# Patient Record
Sex: Female | Born: 1980 | Race: Black or African American | Hispanic: No | Marital: Single | Smoking: Current every day smoker
Health system: Southern US, Community
[De-identification: ages and names within clinical notes are randomized; demographics above are authoritative.]

## PROBLEM LIST (undated history)

## (undated) DIAGNOSIS — D649 Anemia, unspecified: Secondary | ICD-10-CM

## (undated) DIAGNOSIS — T7840XA Allergy, unspecified, initial encounter: Secondary | ICD-10-CM

## (undated) HISTORY — DX: Allergy, unspecified, initial encounter: T78.40XA

---
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
## 2011-07-16 HISTORY — PX: CHOLECYSTECTOMY: SHX55

## 2019-07-20 ENCOUNTER — Encounter: Payer: Self-pay | Admitting: Family Medicine

## 2019-07-20 ENCOUNTER — Ambulatory Visit (INDEPENDENT_AMBULATORY_CARE_PROVIDER_SITE_OTHER): Payer: BC Managed Care – PPO | Admitting: Family Medicine

## 2019-07-20 ENCOUNTER — Other Ambulatory Visit: Payer: Self-pay

## 2019-07-20 VITALS — BP 138/72 | HR 93 | Wt 296.0 lb

## 2019-07-20 DIAGNOSIS — N83209 Unspecified ovarian cyst, unspecified side: Secondary | ICD-10-CM | POA: Insufficient documentation

## 2019-07-20 DIAGNOSIS — D510 Vitamin B12 deficiency anemia due to intrinsic factor deficiency: Secondary | ICD-10-CM

## 2019-07-20 DIAGNOSIS — Z9884 Bariatric surgery status: Secondary | ICD-10-CM | POA: Insufficient documentation

## 2019-07-20 DIAGNOSIS — D519 Vitamin B12 deficiency anemia, unspecified: Secondary | ICD-10-CM | POA: Insufficient documentation

## 2019-07-20 DIAGNOSIS — M545 Low back pain: Secondary | ICD-10-CM

## 2019-07-20 DIAGNOSIS — Z8719 Personal history of other diseases of the digestive system: Secondary | ICD-10-CM | POA: Insufficient documentation

## 2019-07-20 DIAGNOSIS — F17200 Nicotine dependence, unspecified, uncomplicated: Secondary | ICD-10-CM | POA: Insufficient documentation

## 2019-07-20 DIAGNOSIS — G8929 Other chronic pain: Secondary | ICD-10-CM | POA: Insufficient documentation

## 2019-07-20 DIAGNOSIS — K219 Gastro-esophageal reflux disease without esophagitis: Secondary | ICD-10-CM | POA: Insufficient documentation

## 2019-07-20 NOTE — Assessment & Plan Note (Signed)
She reports that she would like to lose some weight but this has been difficult in the setting of Covid which is actually to weight gain.  Will discuss in more depth at subsequent visit.

## 2019-07-20 NOTE — Assessment & Plan Note (Signed)
She was encouraged to reduce her alcohol use as much as possible to avoid recurrence of her pancreatitis.

## 2019-07-20 NOTE — Assessment & Plan Note (Signed)
-  Release of information filled out for PCP to obtain records of MRI which shows evidence ovarian cyst -We will follow-up with characterization of cyst and consider further work-up options

## 2019-07-20 NOTE — Progress Notes (Signed)
Subjective:  Alexis Avila is a 39 y.o. female who presents to the [facility] today with a chief complaint of establishing care.   HPI: Alexis Avila recently moved from [place] to [place] with her boyfriend.  She lives at home with her boyfriend and feels safe in that environment.  She currently works at a call center.  Her pertinent medical history is noted below.  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  She reports having B12 injections routinely.  Her last B12 injection was roughly 1 year ago.  She is not sure when she would next be due for an injection but does feel like she might have a low energy level.  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  She continues to drink roughly 13 drinks per week of liquor, wine, beer.  Current smoker She reports that she smokes roughly half a pack per day and has been smoking for roughly the past 5 years.  Desires fertility She is currently sexually active with 1 female partner.  She is interested in becoming pregnant.  She is not currently using any form of birth control.  She has been active with this 1 partner for several years now and with no one else.  She has been tested for STIs since she became active with this partner.  Cancer screening Her last Pap smear was performed in 2019 and was entirely unremarkable per her report.  GERD This is been a recurrent issue since her gastric bypass.  She has previously been on medication and now uses over-the-counter Tums and Prilosec.  She reports that she does not take Tums every day and that it has been years since she was given a prescription for this issue.  Chief Complaint noted Review of Symptoms - see HPI PMH - Smoking status noted.    Objective:  Physical Exam: BP 138/72   Pulse 93   Wt 296 lb (134.3 kg)   SpO2 98%    Gen: 39 year old obese woman appearance consistent with stated age.  NAD, resting comfortably HEENT: Poor  dentition.  Multiple dental caries present in addition to 2 upper molars absent. CV: RRR with no murmurs appreciated Pulm: NWOB, CTAB with no crackles, wheezes, or rhonchi GI: Normal bowel sounds present. Soft, Nontender, Nondistended. MSK: no edema, cyanosis, or clubbing noted Skin: warm, dry Neuro: grossly normal, moves all extremities Psych: Normal affect and thought content  No results found for this or any previous visit (from the past 72 hour(s)).   Assessment/Plan:  Ovarian cyst -Release of information filled out for PCP to obtain records of MRI which shows evidence ovarian cyst -We will follow-up with characterization of cyst and consider further work-up options  GERD (gastroesophageal reflux disease) We will continue to monitor for now.  No medication prescribed at this time.  Morbid obesity ([place]) She reports that she would like to lose some weight but this has been difficult in the setting of Covid which is actually to weight gain.  Will discuss in more depth at subsequent visit.  History of pancreatitis She was encouraged to reduce her alcohol use as much as possible to avoid recurrence of her pancreatitis.  Current smoker She is encouraged to reduce or cease smoking as this is possible.  She reported that she would like to try and quit smoking later on in the year 2021.  She understands that if she becomes pregnant, smoking can lead to harm to the fetus.  Chronic back pain No regular pain management necessary at this time. -We will continue to monitor -Release of information filled out for MRI lumbar spine from her PCP in [place]  Anemia, B12 deficiency -Follow-up CBC, B12 level

## 2019-07-20 NOTE — Assessment & Plan Note (Signed)
She is encouraged to reduce or cease smoking as this is possible.  She reported that she would like to try and quit smoking later on in the year 2021.  She understands that if she becomes pregnant, smoking can lead to harm to the fetus.

## 2019-07-20 NOTE — Assessment & Plan Note (Signed)
-  Follow-up CBC, B12 level

## 2019-07-20 NOTE — Assessment & Plan Note (Addendum)
No regular pain management necessary at this time. -We will continue to monitor -Release of information filled out for MRI lumbar spine from her PCP in [place]

## 2019-07-20 NOTE — Assessment & Plan Note (Signed)
We will continue to monitor for now.  No medication prescribed at this time.

## 2019-07-21 NOTE — Addendum Note (Signed)
Addended by: Grant Ruts on: 07/21/2019 04:00 PM   Modules accepted: Orders

## 2019-07-22 LAB — VITAMIN B12: Vitamin B-12: 142 pg/mL — ABNORMAL LOW (ref 232–1245)

## 2019-07-23 ENCOUNTER — Encounter: Payer: Self-pay | Admitting: Family Medicine

## 2019-07-23 NOTE — Progress Notes (Signed)
2 attempts to reach pt by phone. VM not set up. Generic VM left.  Letter sent that pt would benefit form B12 injections.

## 2019-07-24 LAB — CBC
Hematocrit: 35.4 % (ref 34.0–46.6)
Hemoglobin: 10.6 g/dL — ABNORMAL LOW (ref 11.1–15.9)
MCH: 25.4 pg — ABNORMAL LOW (ref 26.6–33.0)
MCHC: 29.9 g/dL — ABNORMAL LOW (ref 31.5–35.7)
MCV: 85 fL (ref 79–97)
Platelets: 257 10*3/uL (ref 150–450)
RBC: 4.17 x10E6/uL (ref 3.77–5.28)
RDW: 19.2 % — ABNORMAL HIGH (ref 11.7–15.4)
WBC: 6.1 10*3/uL (ref 3.4–10.8)

## 2019-07-24 LAB — VITAMIN B1: Thiamine: 94.3 nmol/L (ref 66.5–200.0)

## 2019-07-26 ENCOUNTER — Telehealth: Payer: Self-pay | Admitting: Family Medicine

## 2019-07-26 NOTE — Telephone Encounter (Signed)
Called pt  No answer  Will try again later

## 2019-07-26 NOTE — Telephone Encounter (Signed)
VM not set up. LM to call clinic for lab results.

## 2019-07-26 NOTE — Telephone Encounter (Signed)
Will forward to Dr. Pilar Plate.  Lauralei Clouse,CMA

## 2019-07-26 NOTE — Telephone Encounter (Signed)
Pt returning [place] call. Please call pt back w/ results.

## 2019-07-29 ENCOUNTER — Other Ambulatory Visit: Payer: Self-pay | Admitting: Family Medicine

## 2019-07-29 DIAGNOSIS — D510 Vitamin B12 deficiency anemia due to intrinsic factor deficiency: Secondary | ICD-10-CM

## 2019-07-29 MED ORDER — CYANOCOBALAMIN 1000 MCG/ML IJ SOLN
1000.0000 ug | INTRAMUSCULAR | Status: AC
  Administered 2019-08-03: 1000 ug via INTRAMUSCULAR

## 2019-07-29 NOTE — Progress Notes (Signed)
[name] called and informed that her B 12 is low and she will need weekly injections for the next 4 weeks.   Initial RN visit scheduled.  B12 order placed.  Alexis Haymaker, MD

## 2019-07-30 ENCOUNTER — Telehealth: Payer: Self-pay | Admitting: Family Medicine

## 2019-07-30 NOTE — Telephone Encounter (Signed)
Patient says she needs a doctor's note for medical necessity to send to her insurance company. It needs to state that she needs to go to the gym due to obesity. Patient would like to pick this letter up when she comes to her appointment on 08-03-2019 if possible. Please call patient with any questions.

## 2019-07-30 NOTE — Telephone Encounter (Signed)
Routed note to PCP. Salvatore Marvel, CMA

## 2019-08-03 ENCOUNTER — Other Ambulatory Visit: Payer: Self-pay

## 2019-08-03 ENCOUNTER — Ambulatory Visit (INDEPENDENT_AMBULATORY_CARE_PROVIDER_SITE_OTHER): Payer: BC Managed Care – PPO

## 2019-08-03 DIAGNOSIS — D510 Vitamin B12 deficiency anemia due to intrinsic factor deficiency: Secondary | ICD-10-CM

## 2019-08-03 NOTE — Progress Notes (Signed)
Patient presents in nurse clinic for #1 b12 injection out of 4, per [place] orders. Injection given RD, site unremarkable. Patient to return same time next week, reminder card given.

## 2019-08-10 ENCOUNTER — Ambulatory Visit: Payer: BC Managed Care – PPO

## 2019-08-11 ENCOUNTER — Ambulatory Visit: Payer: BC Managed Care – PPO

## 2019-08-31 ENCOUNTER — Ambulatory Visit: Payer: BC Managed Care – PPO

## 2019-09-07 DIAGNOSIS — H0011 Chalazion right upper eyelid: Secondary | ICD-10-CM | POA: Diagnosis not present

## 2019-12-15 ENCOUNTER — Encounter: Payer: Self-pay | Admitting: Family Medicine

## 2020-02-06 ENCOUNTER — Other Ambulatory Visit: Payer: Self-pay

## 2020-02-06 ENCOUNTER — Emergency Department (HOSPITAL_COMMUNITY)
Admission: EM | Admit: 2020-02-06 | Discharge: 2020-02-06 | Discharge status: Against Medical Advice | Payer: BC Managed Care – PPO

## 2020-02-06 DIAGNOSIS — R112 Nausea with vomiting, unspecified: Secondary | ICD-10-CM | POA: Diagnosis not present

## 2020-02-06 DIAGNOSIS — N390 Urinary tract infection, site not specified: Secondary | ICD-10-CM | POA: Diagnosis not present

## 2020-02-09 ENCOUNTER — Other Ambulatory Visit: Payer: Self-pay

## 2020-02-09 ENCOUNTER — Encounter (HOSPITAL_COMMUNITY): Payer: Self-pay | Admitting: *Deleted

## 2020-02-09 ENCOUNTER — Emergency Department (HOSPITAL_COMMUNITY): Payer: BC Managed Care – PPO

## 2020-02-09 ENCOUNTER — Ambulatory Visit (INDEPENDENT_AMBULATORY_CARE_PROVIDER_SITE_OTHER): Payer: BC Managed Care – PPO | Admitting: Student in an Organized Health Care Education/Training Program

## 2020-02-09 ENCOUNTER — Inpatient Hospital Stay (HOSPITAL_COMMUNITY)
Admission: EM | Admit: 2020-02-09 | Discharge: 2020-02-23 | DRG: 326 | Disposition: A | Discharge status: Home Health | Payer: BC Managed Care – PPO | Source: Ambulatory Visit | Attending: Family Medicine | Admitting: Family Medicine

## 2020-02-09 DIAGNOSIS — E871 Hypo-osmolality and hyponatremia: Secondary | ICD-10-CM | POA: Diagnosis present

## 2020-02-09 DIAGNOSIS — K661 Hemoperitoneum: Secondary | ICD-10-CM | POA: Diagnosis not present

## 2020-02-09 DIAGNOSIS — J9811 Atelectasis: Secondary | ICD-10-CM | POA: Diagnosis present

## 2020-02-09 DIAGNOSIS — K5651 Intestinal adhesions [bands], with partial obstruction: Secondary | ICD-10-CM | POA: Diagnosis present

## 2020-02-09 DIAGNOSIS — Z825 Family history of asthma and other chronic lower respiratory diseases: Secondary | ICD-10-CM

## 2020-02-09 DIAGNOSIS — K9589 Other complications of other bariatric procedure: Principal | ICD-10-CM | POA: Diagnosis present

## 2020-02-09 DIAGNOSIS — R918 Other nonspecific abnormal finding of lung field: Secondary | ICD-10-CM | POA: Diagnosis not present

## 2020-02-09 DIAGNOSIS — Z87440 Personal history of urinary (tract) infections: Secondary | ICD-10-CM

## 2020-02-09 DIAGNOSIS — R1084 Generalized abdominal pain: Secondary | ICD-10-CM | POA: Diagnosis not present

## 2020-02-09 DIAGNOSIS — E878 Other disorders of electrolyte and fluid balance, not elsewhere classified: Secondary | ICD-10-CM | POA: Diagnosis present

## 2020-02-09 DIAGNOSIS — A419 Sepsis, unspecified organism: Secondary | ICD-10-CM | POA: Diagnosis not present

## 2020-02-09 DIAGNOSIS — E8770 Fluid overload, unspecified: Secondary | ICD-10-CM | POA: Diagnosis not present

## 2020-02-09 DIAGNOSIS — R7402 Elevation of levels of lactic acid dehydrogenase (LDH): Secondary | ICD-10-CM | POA: Diagnosis present

## 2020-02-09 DIAGNOSIS — Z20822 Contact with and (suspected) exposure to covid-19: Secondary | ICD-10-CM | POA: Diagnosis not present

## 2020-02-09 DIAGNOSIS — M7989 Other specified soft tissue disorders: Secondary | ICD-10-CM | POA: Diagnosis not present

## 2020-02-09 DIAGNOSIS — I878 Other specified disorders of veins: Secondary | ICD-10-CM | POA: Diagnosis not present

## 2020-02-09 DIAGNOSIS — K2100 Gastro-esophageal reflux disease with esophagitis, without bleeding: Secondary | ICD-10-CM | POA: Diagnosis not present

## 2020-02-09 DIAGNOSIS — F17210 Nicotine dependence, cigarettes, uncomplicated: Secondary | ICD-10-CM | POA: Diagnosis present

## 2020-02-09 DIAGNOSIS — K3189 Other diseases of stomach and duodenum: Secondary | ICD-10-CM | POA: Diagnosis not present

## 2020-02-09 DIAGNOSIS — R809 Proteinuria, unspecified: Secondary | ICD-10-CM | POA: Diagnosis present

## 2020-02-09 DIAGNOSIS — R1032 Left lower quadrant pain: Secondary | ICD-10-CM | POA: Diagnosis not present

## 2020-02-09 DIAGNOSIS — K863 Pseudocyst of pancreas: Secondary | ICD-10-CM | POA: Insufficient documentation

## 2020-02-09 DIAGNOSIS — Z9049 Acquired absence of other specified parts of digestive tract: Secondary | ICD-10-CM

## 2020-02-09 DIAGNOSIS — Z5331 Laparoscopic surgical procedure converted to open procedure: Secondary | ICD-10-CM

## 2020-02-09 DIAGNOSIS — Z880 Allergy status to penicillin: Secondary | ICD-10-CM

## 2020-02-09 DIAGNOSIS — Z8719 Personal history of other diseases of the digestive system: Secondary | ICD-10-CM

## 2020-02-09 DIAGNOSIS — K859 Acute pancreatitis without necrosis or infection, unspecified: Secondary | ICD-10-CM | POA: Diagnosis not present

## 2020-02-09 DIAGNOSIS — K56600 Partial intestinal obstruction, unspecified as to cause: Secondary | ICD-10-CM | POA: Diagnosis not present

## 2020-02-09 DIAGNOSIS — Z9884 Bariatric surgery status: Secondary | ICD-10-CM | POA: Diagnosis not present

## 2020-02-09 DIAGNOSIS — K651 Peritoneal abscess: Secondary | ICD-10-CM | POA: Diagnosis present

## 2020-02-09 DIAGNOSIS — K861 Other chronic pancreatitis: Secondary | ICD-10-CM | POA: Diagnosis present

## 2020-02-09 DIAGNOSIS — Z9889 Other specified postprocedural states: Secondary | ICD-10-CM | POA: Diagnosis not present

## 2020-02-09 DIAGNOSIS — K314 Gastric diverticulum: Secondary | ICD-10-CM | POA: Diagnosis present

## 2020-02-09 DIAGNOSIS — R0600 Dyspnea, unspecified: Secondary | ICD-10-CM | POA: Diagnosis not present

## 2020-02-09 DIAGNOSIS — R109 Unspecified abdominal pain: Secondary | ICD-10-CM | POA: Diagnosis not present

## 2020-02-09 DIAGNOSIS — Z8249 Family history of ischemic heart disease and other diseases of the circulatory system: Secondary | ICD-10-CM

## 2020-02-09 DIAGNOSIS — R748 Abnormal levels of other serum enzymes: Secondary | ICD-10-CM | POA: Diagnosis present

## 2020-02-09 DIAGNOSIS — R933 Abnormal findings on diagnostic imaging of other parts of digestive tract: Secondary | ICD-10-CM | POA: Diagnosis not present

## 2020-02-09 DIAGNOSIS — Z980 Intestinal bypass and anastomosis status: Secondary | ICD-10-CM | POA: Diagnosis not present

## 2020-02-09 DIAGNOSIS — Z6841 Body Mass Index (BMI) 40.0 and over, adult: Secondary | ICD-10-CM

## 2020-02-09 DIAGNOSIS — E876 Hypokalemia: Secondary | ICD-10-CM | POA: Diagnosis present

## 2020-02-09 DIAGNOSIS — K631 Perforation of intestine (nontraumatic): Secondary | ICD-10-CM | POA: Diagnosis not present

## 2020-02-09 DIAGNOSIS — R0602 Shortness of breath: Secondary | ICD-10-CM | POA: Diagnosis not present

## 2020-02-09 DIAGNOSIS — R112 Nausea with vomiting, unspecified: Secondary | ICD-10-CM

## 2020-02-09 DIAGNOSIS — F17200 Nicotine dependence, unspecified, uncomplicated: Secondary | ICD-10-CM | POA: Diagnosis not present

## 2020-02-09 DIAGNOSIS — Z79899 Other long term (current) drug therapy: Secondary | ICD-10-CM

## 2020-02-09 DIAGNOSIS — F1010 Alcohol abuse, uncomplicated: Secondary | ICD-10-CM | POA: Diagnosis present

## 2020-02-09 DIAGNOSIS — K219 Gastro-esophageal reflux disease without esophagitis: Secondary | ICD-10-CM | POA: Diagnosis not present

## 2020-02-09 DIAGNOSIS — R7989 Other specified abnormal findings of blood chemistry: Secondary | ICD-10-CM | POA: Diagnosis not present

## 2020-02-09 DIAGNOSIS — I10 Essential (primary) hypertension: Secondary | ICD-10-CM | POA: Diagnosis present

## 2020-02-09 DIAGNOSIS — K529 Noninfective gastroenteritis and colitis, unspecified: Secondary | ICD-10-CM | POA: Diagnosis not present

## 2020-02-09 DIAGNOSIS — K56609 Unspecified intestinal obstruction, unspecified as to partial versus complete obstruction: Secondary | ICD-10-CM | POA: Diagnosis not present

## 2020-02-09 DIAGNOSIS — K289 Gastrojejunal ulcer, unspecified as acute or chronic, without hemorrhage or perforation: Secondary | ICD-10-CM | POA: Diagnosis not present

## 2020-02-09 DIAGNOSIS — R4781 Slurred speech: Secondary | ICD-10-CM | POA: Diagnosis present

## 2020-02-09 DIAGNOSIS — K5669 Other partial intestinal obstruction: Secondary | ICD-10-CM | POA: Diagnosis present

## 2020-02-09 DIAGNOSIS — R600 Localized edema: Secondary | ICD-10-CM | POA: Diagnosis not present

## 2020-02-09 DIAGNOSIS — D62 Acute posthemorrhagic anemia: Secondary | ICD-10-CM | POA: Diagnosis not present

## 2020-02-09 DIAGNOSIS — N200 Calculus of kidney: Secondary | ICD-10-CM | POA: Diagnosis not present

## 2020-02-09 DIAGNOSIS — G8929 Other chronic pain: Secondary | ICD-10-CM | POA: Diagnosis present

## 2020-02-09 DIAGNOSIS — R52 Pain, unspecified: Secondary | ICD-10-CM | POA: Diagnosis not present

## 2020-02-09 DIAGNOSIS — K862 Cyst of pancreas: Secondary | ICD-10-CM | POA: Diagnosis not present

## 2020-02-09 DIAGNOSIS — E860 Dehydration: Secondary | ICD-10-CM | POA: Diagnosis present

## 2020-02-09 DIAGNOSIS — R739 Hyperglycemia, unspecified: Secondary | ICD-10-CM | POA: Diagnosis present

## 2020-02-09 DIAGNOSIS — T8149XA Infection following a procedure, other surgical site, initial encounter: Secondary | ICD-10-CM | POA: Diagnosis not present

## 2020-02-09 DIAGNOSIS — E538 Deficiency of other specified B group vitamins: Secondary | ICD-10-CM | POA: Diagnosis present

## 2020-02-09 HISTORY — DX: Anemia, unspecified: D64.9

## 2020-02-09 LAB — CBC
HCT: 37.7 % (ref 36.0–46.0)
Hemoglobin: 11.1 g/dL — ABNORMAL LOW (ref 12.0–15.0)
MCH: 25.7 pg — ABNORMAL LOW (ref 26.0–34.0)
MCHC: 29.4 g/dL — ABNORMAL LOW (ref 30.0–36.0)
MCV: 87.3 fL (ref 80.0–100.0)
Platelets: 396 10*3/uL (ref 150–400)
RBC: 4.32 MIL/uL (ref 3.87–5.11)
RDW: 19.4 % — ABNORMAL HIGH (ref 11.5–15.5)
WBC: 10.5 10*3/uL (ref 4.0–10.5)
nRBC: 0 % (ref 0.0–0.2)

## 2020-02-09 LAB — COMPREHENSIVE METABOLIC PANEL
ALT: 55 U/L — ABNORMAL HIGH (ref 0–44)
AST: 29 U/L (ref 15–41)
Albumin: 3.4 g/dL — ABNORMAL LOW (ref 3.5–5.0)
Alkaline Phosphatase: 1066 U/L — ABNORMAL HIGH (ref 38–126)
Anion gap: 10 (ref 5–15)
BUN: 9 mg/dL (ref 6–20)
CO2: 20 mmol/L — ABNORMAL LOW (ref 22–32)
Calcium: 10.8 mg/dL — ABNORMAL HIGH (ref 8.9–10.3)
Chloride: 104 mmol/L (ref 98–111)
Creatinine, Ser: 0.62 mg/dL (ref 0.44–1.00)
GFR calc Af Amer: >60 mL/min (ref >60)
GFR calc non Af Amer: >60 mL/min (ref >60)
Glucose, Bld: 144 mg/dL — ABNORMAL HIGH (ref 70–99)
Potassium: 3.1 mmol/L — ABNORMAL LOW (ref 3.5–5.1)
Sodium: 134 mmol/L — ABNORMAL LOW (ref 135–145)
Total Bilirubin: 1 mg/dL (ref 0.3–1.2)
Total Protein: 7.7 g/dL (ref 6.5–8.1)

## 2020-02-09 LAB — LIPASE, BLOOD: Lipase: 113 U/L — ABNORMAL HIGH (ref 11–51)

## 2020-02-09 LAB — SARS CORONAVIRUS 2 BY RT PCR (HOSPITAL ORDER, PERFORMED IN ~~LOC~~ HOSPITAL LAB): SARS Coronavirus 2: NEGATIVE

## 2020-02-09 LAB — TROPONIN I (HIGH SENSITIVITY): Troponin I (High Sensitivity): 3 ng/L (ref <18)

## 2020-02-09 LAB — I-STAT BETA HCG BLOOD, ED (MC, WL, AP ONLY): I-stat hCG, quantitative: <5 m[IU]/mL (ref <5)

## 2020-02-09 MED ORDER — POTASSIUM CHLORIDE 10 MEQ/100ML IV SOLN: 10.0000 meq | INTRAVENOUS | Status: DC

## 2020-02-09 MED ORDER — ONDANSETRON HCL 4 MG/2ML IJ SOLN
4.0000 mg | Freq: Once | INTRAMUSCULAR | Status: DC
  Administered 2020-02-09: 4 mg via INTRAMUSCULAR

## 2020-02-09 MED ORDER — SODIUM CHLORIDE 0.9% FLUSH: 3.0000 mL | Freq: Once | INTRAVENOUS | Status: DC

## 2020-02-09 MED ORDER — POTASSIUM CHLORIDE 10 MEQ/100ML IV SOLN
10.0000 meq | Freq: Once | INTRAVENOUS | Status: AC
  Administered 2020-02-09: 10 meq via INTRAVENOUS
  Filled 2020-02-09: qty 100

## 2020-02-09 MED ORDER — ONDANSETRON HCL 4 MG/2ML IJ SOLN: 8.0000 mg | Freq: Once | INTRAMUSCULAR | Status: DC

## 2020-02-09 MED ORDER — POTASSIUM CHLORIDE CRYS ER 20 MEQ PO TBCR
40.0000 meq | EXTENDED_RELEASE_TABLET | Freq: Once | ORAL | Status: AC
  Administered 2020-02-09: 40 meq via ORAL
  Filled 2020-02-09: qty 2

## 2020-02-09 MED ORDER — SODIUM CHLORIDE 0.9 % IV BOLUS
1000.0000 mL | Freq: Once | INTRAVENOUS | Status: AC
  Administered 2020-02-09: 1000 mL via INTRAVENOUS

## 2020-02-09 MED ORDER — IOHEXOL 300 MG/ML  SOLN
100.0000 mL | Freq: Once | INTRAMUSCULAR | Status: AC | PRN
  Administered 2020-02-09: 100 mL via INTRAVENOUS

## 2020-02-09 MED ORDER — MORPHINE SULFATE (PF) 4 MG/ML IV SOLN
4.0000 mg | Freq: Once | INTRAVENOUS | Status: AC
  Administered 2020-02-09: 4 mg via INTRAVENOUS
  Filled 2020-02-09: qty 1

## 2020-02-09 MED ORDER — ONDANSETRON HCL 4 MG/2ML IJ SOLN
4.0000 mg | Freq: Once | INTRAMUSCULAR | Status: AC
  Administered 2020-02-09: 4 mg via INTRAVENOUS
  Filled 2020-02-09: qty 2

## 2020-02-09 MED ORDER — HYDROMORPHONE HCL 1 MG/ML IJ SOLN
1.0000 mg | Freq: Once | INTRAMUSCULAR | Status: AC
  Administered 2020-02-09: 1 mg via INTRAVENOUS
  Filled 2020-02-09: qty 1

## 2020-02-09 MED ORDER — PROMETHAZINE HCL 25 MG/ML IJ SOLN
12.5000 mg | Freq: Once | INTRAMUSCULAR | Status: AC
  Administered 2020-02-09: 12.5 mg via INTRAVENOUS
  Filled 2020-02-09: qty 1

## 2020-02-09 NOTE — Assessment & Plan Note (Addendum)
Intractable nausea and vomiting associated with abdominal pain. Patient is symptomatically dehydrated and tachycardic. Will likely need IV hydration. History of pancreatitis and is acutely tender to epigastric palpation. Recommended patient proceed to the emergency department for evaluation and treatment. Providing Zofran IM prior to leaving office to help her with symptoms as she is vomiting in office. Patient was precepted with attending Dr. Owens Shark. Patient is in agreement with this plan and will be taken to the emergency department via wheelchair.

## 2020-02-09 NOTE — Patient Instructions (Signed)
It was a pleasure to see you today!  To summarize our discussion for this visit:  I am sorry to hear that you are feeling so unwell.  It looks like you are very dehydrated and in a lot of pain.  I am going to give you some antinausea medication to help with the symptoms while you are waiting to see a provider in the emergency department.  I think it is a good idea for you to go to the ED now so that you can get IV fluids and treated for your nausea.  I would suspect they would check you for pancreatitis as well.  Some additional health maintenance measures we should update are: Health Maintenance Due  Topic Date Due  . Hepatitis C Screening  Never done  . COVID-19 Vaccine (1) Never done  . HIV Screening  Never done  . TETANUS/TDAP  Never done  . PAP SMEAR-Modifier  Never done  .    Call the clinic at [phone] if your symptoms worsen or you have any concerns.   Thank you for allowing me to take part in your care,  Dr. Doristine Mango

## 2020-02-09 NOTE — ED Triage Notes (Signed)
Pt sent here from pcp office for mid abd pain since Friday with n/v and fatigue. Denies diarrhea. Hx of pancreatitis.

## 2020-02-09 NOTE — Progress Notes (Signed)
   SUBJECTIVE:   CHIEF COMPLAINT / HPI: N/V, abdominal pain, SOB, bloating, insomnia  Sick since Thursday.  No fevers Went to ED 7/25 but wait too long. UC on Sunday and had UTI- given nitrofurantoin and promethazine. Making her drowsy.  Hot tea is only thing shes able to keep down.  Vomited 10 times in last 24 hours. No blood.  No diarrhea any more but was present when started.  Tuesday went to UC again and they gave her a work note. Blood in her nose when she blows.  Had frequent bloody noses in the past. White vomit or brown and has a nasty smell. Can only sleep on left side. Nausea when lays on her right and starts coughing.  Has excessive gas and belching which causes her to vomit.  Migrating abdominal pain Had pancreatitis 2 years ago-this feels different than that. Described as a mentrual cramp. Lives with boyfriend who is not sick. Decreased urine output. Poor PO.  Review of Systems  Constitutional: Positive for malaise/fatigue. Negative for fever.  HENT: Positive for congestion. Negative for ear pain and sore throat.   Respiratory: Positive for cough, sputum production and shortness of breath. Negative for hemoptysis.   Gastrointestinal: Positive for diarrhea, nausea and vomiting. Negative for blood in stool.  Skin: Negative for rash.  Neurological: Positive for dizziness and headaches.   OBJECTIVE:   BP 115/75   Pulse (!) 120   Temp (!) 96.4 F (35.8 C)   Ht 5\' 10"  (1.778 m)   Wt (!) 274 lb 9.6 oz (124.6 kg)   SpO2 99%   BMI 39.40 kg/m   General: NAD, pleasant, able to participate in exam Cardiac: RRR, normal heart sounds, no murmurs. 2+ radial and PT pulses bilaterally Respiratory: CTAB, normal effort, No wheezes, rales or rhonchi Abdomen: soft, acutely tender in epigastric area, bloated, no hepatic or splenomegaly, +BS Extremities: no edema or cyanosis. WWP. Skin: warm and dry, no rashes noted Neuro: alert and oriented x4, no focal deficits Psych: Normal  affect and mood  ASSESSMENT/PLAN:   Intractable nausea and vomiting Intractable nausea and vomiting associated with abdominal pain. Patient is symptomatically dehydrated and tachycardic. History of pancreatitis and is acutely tender to epigastric palpation. Recommended patient proceed to the emergency department for evaluation and treatment. Providing Zofran IM prior to leaving office to help her with symptoms as she is vomiting in office. Patient was precepted with attending Dr. Owens Shark. Patient is in agreement with this plan and will be taken to the emergency department via wheelchair.     [place]

## 2020-02-09 NOTE — H&P (Addendum)
[facility] Admission History and Physical Service Pager: [phone]  Patient name: Alexis Avila Medical record number: [id number] Date of birth: 10-14-1980 Age: 39 y.o. Gender: female  Primary Care Provider: [name], MD Consultants: GI, gen surg Code Status: Full Preferred Emergency Contact: Jess Barters [phone]  Chief Complaint: Abdominal pain  Assessment and Plan: Alexis Avila is a 39 y.o. 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 PMH is significant for GERD and pancreatitis.  Partial SBO  pancreatic pseudocyst Presents with 5-day history of epigastric abdominal pain, nausea, vomiting, shortness of breath.  Vital signs stable in ED, mild hypertension (systolics 440-102, diastolic 72-53), slight tachycardia (HR 98-105).  SPO2 95-98% RA.  Given epigastric pain, obtained troponin 3, awaiting repeat. Admission alk phos 1,066, AST 29, ALT 55.  Lipase 113.  CXR showed mild left basilar atelectasis and elevation of left hemidiaphragm.  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  Pseudocysts could be causing increase in lipase and alk phos.  No fevers and no signs of necrotizing pancreatitis on imaging.  Patient with epigastric tenderness to palpation, otherwise mildly diffuse tenderness to palpation.  Given morphine 4 mg, Dilaudid 1 mg, Zofran, Phenergan in ED with moderate relief.  Patient was seen shortly after receiving Dilaudid 1 mg IV.  She was not complaining of pain, but did state that she was feeling tired and slightly slurring her speech.  Would opt to give decreased doses of IV pain medication.  Differential  includes partial SBO 2/2 pancreatic pseudocysts versus pancreatitis.  Patient also has a history of gastric bypass surgery, therefore is at increased risk for small bowel obstruction with prior intra-abdominal surgery.  She does not have a complete bowel obstruction and has been having BMs, last to this AM.  GI and surgery consulted in ED, plan to see her in the morning.  Per surgery, no NG tube is needed at this time.  Patient did have slight tachycardia on presentation to low 100s and tacky mucous membranes, cap refill less than 2 seconds.  We will go ahead and give her maintenance IV fluids overnight, s/p 1 L NS in ED. -Admit to [place] floor with Dr. Nori Riis attending - Vitals per floor protocol - mIVF -A.m. CMP/CBC -Trend troponin -Follow-up GI and surgery recs in morning -Continue morphine 1 mg every 2 as needed for pain -Continue Zofran as needed for nausea (QTc 468) - NPO -SCDs for DVT prophylaxis  Elevated alkaline phosphatase Admission alk phos 1,066, AST 29, ALT 55.  Lipase 113.  Likely in the setting of known pancreatic pseudocysts and partial SBO that could be causing blockage in biliary system -Follow-up GGT lab - surgery to see in AM  Shortness of breath Patient endorsed some shortness of breath when discussing what has been occurring for the last 5 days.  She reports that it seemed to occur when she was feeling very ill, especially with dizziness and overall weakness.  States that she would just feel that she had difficulty catching her breath when she felt very ill.  No known sick contacts, has had a dry cough, but no congestion.  Chest x-ray with left basilar atelectasis and elevation of left hemidiaphragm.  WBC 10.5.  It is unlikely that she has pneumonia.  Suspect that the shortness of breath is just related to her overall picture of having pain and feeling unwell with partial SBO.  Her lungs are clear on examination, no increased work of breathing on room air.  Oxygen saturations  have been stable in the ED.  We will continue to monitor closely. - cont to monitor  Recent diagnosis of UTI Patient has been treated for 3 days of nitrofurantoin for UTI diagnosed at urgent care when she presented with the same symptoms that she is having at present.  No dysuria, hematuria, polyuria..  Patient has pain all over her abdomen, but states it is not concentrated in the suprapubic region, is concentrated in the epigastric region.  No fevers, WBC within normal limits.  Given this, would not treat for UTI at this time. -Discontinue nitrofurantoin -UA  Hypokalemia  Mild Hyponatremia Admission potassium 3.1.  ED repleted with 10 mEq IV.  Family medicine ordered four additional rounds of 10 mEq/hour; however, patient experienced burning at IV site.  Switch to p.o. repletion K-Dur 40 mEq x1.  BMP could have been collected at 12:26 PM, therefore to light out on mag.  Will add on mag to morning labs to see if also needs repleted.  Na 134 on admission, likely in the setting of GI losses and dehydration.  Fluids per above. -Follow-up potassium on morning CMP  Normocytic anemia  history of B12 deficiency anemia  s/p gastric bypass Admission hemoglobin 11.1, MCV 87.3.  Last hemoglobin in chart on 07/20/2019 was 10.6.  Patient is currently menstruating. -Transfusion threshold of 7 due to lack of CAD/PAD comorbidities -Follow-up morning CBC -Follow-up B12, folate levels  Hyperglycemia Admission glucose 144.  No diagnosis of diabetes in her chart. -Follow-up glucose on CBC -Consider A1c  Borderline normal EKG Admission EKG showed NSR, borderline conduction delays, appears unchanged from previous.  Correlate with repeat EKG -Follow-up EKG -No need for continuous telemetry at this time  Hx pancreatitis  Heavy alcohol use In 2018, with hospitalization.  Etiology of heavy alcohol use.  She has since cut back, now drinking twice a week with reported 3-4 drinks per episode.  Drink of choice  white wine or vodka.  Last drink 2 weeks ago.  Labs as above. -Follow-up morning CMP - monitor CIWAs without ativan  GERD Currently no complaints of heartburn. -Consider Tums or Protonix as needed for symptoms   FEN/GI: N.p.o.  Prophylaxis: SCDs  Disposition: MedSurg, possible surgery in AM  History of Present Illness:  Catriona Dillenbeck is a 39 y.o. female presenting with 5 days of epigastric abdominal pain.  7/23 started to have abdominal pain and could eat or drink, couldn't keep anything down.  States that she was bringing up phlegm.  She was checked out by EMS on 7/24 and ended up going to Urgent Care.  She was told that she had a UTI which could be the reason for her pain and shortness of breath.  She was discharged home, but had no improvement.  All night she was vomiting, unable to sleep, couldn't tolerate PO, had shortness of breath, and overall weakness.  Earlier today she tolerated a gatorade.  Presented to [facility] today and was sent to ED.    Pain is in the epigastric region, radiating into the lower abdomen.  Also had dizziness while standing and has generalized weakness.  In ED, she has received pain medication and nausea medication.  She states that he is feeling much better now.  Also received 1L NS bolus.  Vomit was also white and foamy.  3 days ago, she had a change to brown when she was at Urgent Care and she felt like it smelled very bad.  Mostly though, it has been really foamy or mucusy.    Shortness of breath started on 7/24 at night.  She states that her chest felt very tight.  It was all the time, at rest and when walking around.  She was coughing a lot and if she was lying on her right side, she had worsening nausea and coughing.  Earlier at [facility] she was vomiting.  Feels her heart beat going faster and pounding when she is very nauseous.  No palpitations.   Bowels have been moving well, last BM this AM.  LMP 7/22  Had pancreatitis in 2018 and was admitted for that.   She was drinking at that time which was the cause.  States that she stopped drinking heavily after that time.  Last drink of alcohol was 2 weeks ago, states that she had one drink at that time.  In a week, consumes about 3-4 cups on Tuesdays and Saturdays. Drinks white wine, vodka.  No specific suprapubic tenderness, no increase in urination.  Patient actually reports decrease in urination.  No hematuria.  No dysuria.  Review Of Systems: Per HPI with the following additions:   Review of Systems  Constitutional: Negative for chills and fever.  HENT: Negative for congestion and sore throat.   Eyes: Negative for visual disturbance.  Respiratory: Positive for cough, chest tightness and shortness of breath.   Cardiovascular: Negative for chest pain, palpitations and leg swelling.  Gastrointestinal: Positive for abdominal pain, nausea and vomiting. Negative for blood in stool, constipation and diarrhea.  Genitourinary: Positive for decreased urine volume. Negative for difficulty urinating, dysuria, hematuria and pelvic pain.  Neurological: Positive for dizziness, weakness (generalized) and headaches. Negative for syncope.     Patient Active Problem List   Diagnosis Date Noted  . Intractable nausea and vomiting 02/09/2020  . History of pancreatitis 07/20/2019  . Status post gastric bypass for obesity 07/20/2019  . Anemia, B12 deficiency 07/20/2019  . Current smoker 07/20/2019  . Chronic back pain 07/20/2019  . Ovarian cyst 07/20/2019  . GERD (gastroesophageal reflux disease) 07/20/2019  . Morbid obesity ([place]) 07/20/2019    Past Medical History: Past Medical History:  Diagnosis Date  . Allergy     Past Surgical History: Past Surgical History:  Procedure Laterality Date  . CHOLECYSTECTOMY  2013   crystalized gallbaldder  . GASTRIC BYPASS  2002    Social History: Social History   Tobacco Use  . Smoking status: Current Every Day Smoker    Packs/day: 0.50    Years: 5.00    Pack  years: 2.50    Types: Cigarettes  . Smokeless tobacco: Never Used  Substance Use Topics  . Alcohol use: Yes    Alcohol/week: 13.0 standard drinks    Types: 5 Glasses of wine, 4 Cans of beer, 4 Shots of liquor per week  . Drug use: Not on file   Additional social history: smokes 3-4 cigarettes a day, x10 year.  Occasional THC use 1-2x a month.  No other drugs.  Please also refer to relevant sections of EMR.    Family History: Family History  Problem Relation Age of Onset  . COPD Sister   . Heart disease Sister   No family hx pancreatitis  Allergies and Medications: Allergies  Allergen Reactions  . Penicillins Hives  No current facility-administered medications on file prior to encounter.   Current Outpatient Medications on File Prior to Encounter  Medication Sig Dispense Refill  . nitrofurantoin (MACRODANTIN) 100 MG capsule Take 100 mg by mouth 2 (two) times daily.    . promethazine (PHENERGAN) 12.5 MG tablet Take 12.5 mg by mouth every 6 (six) hours as needed for nausea or vomiting.      Objective: BP 126/78 (BP Location: Left Arm)   Pulse 99   Temp 98.5 F (36.9 C) (Oral)   Resp (!) 25   LMP 02/03/2020   SpO2 95%   Exam: General: Awake, alert, oriented; a little drowsy from pain medicine Eyes: PERRL, EOM intact ENTM: Oral mucosa tachy and pink, intact dentition, lingual piercing in place Neck: Supple, trachea midline, no mass Cardiovascular: Slightly tachycardic, regular rhythm, no murmurs auscultated Respiratory: Clear to auscultation bilaterally Abdomen: Bowel sounds present, epigastric TTP, no rebound tenderness Extremities: No BLE edema, palpable pedal pulses, cap refill <2 sec Neuro: Cranial nerves II through X grossly intact, able to move extremities spontaneously Psych: Normal insight and judgment  Labs and Imaging: CBC BMET  Recent Labs  Lab 02/09/20 1226  WBC 10.5  HGB 11.1*  HCT 37.7  PLT 396   Recent Labs  Lab 02/09/20 1226  NA 134*  K  3.1*  CL 104  CO2 20*  BUN 9  CREATININE 0.62  GLUCOSE 144*  CALCIUM 10.8*     EKG: Normal sinus rhythm with possible conduction delays.  Correlate with repeat EKG.   Ezequiel Essex, MD 02/09/2020, 10:56 PM PGY-1, [employer] Intern pager: [phone], text pages welcome  FPTS Upper-Level Resident Addendum   I have independently interviewed and examined the patient. I have discussed the above with the original author and agree with their documentation. My edits for correction/addition/clarification are in green. Please see also any attending notes.   [place] Constable, D.O. PGY-3, [facility] 02/10/2020 12:49 AM  FPTS Service pager: [phone] (text pages welcome through [facility])

## 2020-02-09 NOTE — ED Provider Notes (Signed)
[facility] EMERGENCY DEPARTMENT Provider Note   CSN: [id number] Arrival date & time: 02/09/20  1138     History Chief Complaint  Patient presents with  . Abdominal Pain  . Emesis    Alexis Avila is a 39 y.o. female with PMHx GERD and pancreatitis who presents to the ED today with complaint of gradual onset, constant, sharp, epigastric abdominal pain x 5 days. Pt also complains of nausea, NBNB emesis, and fatigue. She reports she went to an UC this weekend and had a U/A, preg test, and COVID test done. The U/A showed findings consistent a UTI and pt was started on macrobid. She was also given phenergan. She reports she has been unable to keep anything down which prompted her to go to her PCP today however she was sent here for further workup. Pt reports hx of pancreatitis in the past related to ETOH - she reports she had 2 small glasses of vodka 2 weeks ago however none since then. Pt states she is having normal BMs; last BM earlier today without blood or melena. She denies any fevers or chills. She does mention that she has been feeling short of breath as well with chest tightness however is unsure if it is related to radiating pain from her abdomen. Pt denies any recent sick contacts. She has not been vaccinated against COVID 19.   The history is provided by the patient and medical records.       Past Medical History:  Diagnosis Date  . Allergy     Patient Active Problem List   Diagnosis Date Noted  . Intractable nausea and vomiting 02/09/2020  . History of pancreatitis 07/20/2019  . Status post gastric bypass for obesity 07/20/2019  . Anemia, B12 deficiency 07/20/2019  . Current smoker 07/20/2019  . Chronic back pain 07/20/2019  . Ovarian cyst 07/20/2019  . GERD (gastroesophageal reflux disease) 07/20/2019  . Morbid obesity ([place]) 07/20/2019    Past Surgical History:  Procedure Laterality Date  . CHOLECYSTECTOMY  2013   crystalized gallbaldder  .  GASTRIC BYPASS  2002     OB History   No obstetric history on file.     Family History  Problem Relation Age of Onset  . COPD Sister   . Heart disease Sister     Social History   Tobacco Use  . Smoking status: Current Every Day Smoker    Packs/day: 0.50    Years: 5.00    Pack years: 2.50    Types: Cigarettes  . Smokeless tobacco: Never Used  Substance Use Topics  . Alcohol use: Yes    Alcohol/week: 13.0 standard drinks    Types: 5 Glasses of wine, 4 Cans of beer, 4 Shots of liquor per week  . Drug use: Not on file    Home Medications Prior to Admission medications   Medication Sig Start Date End Date Taking? Authorizing Provider  nitrofurantoin (MACRODANTIN) 100 MG capsule Take 100 mg by mouth 2 (two) times daily.   Yes [provider]  promethazine (PHENERGAN) 12.5 MG tablet Take 12.5 mg by mouth every 6 (six) hours as needed for nausea or vomiting.   Yes [provider]    Allergies    Penicillins  Review of Systems   Review of Systems  Constitutional: Negative for chills and fever.  Respiratory: Positive for chest tightness and shortness of breath.   Gastrointestinal: Positive for abdominal pain, nausea and vomiting. Negative for constipation and diarrhea.  All other  systems reviewed and are negative.   Physical Exam Updated Vital Signs BP 123/72   Pulse 98   Temp 98.5 F (36.9 C) (Oral)   Resp 16   LMP 02/03/2020   SpO2 98%   Physical Exam Vitals and nursing note reviewed.  Constitutional:      Appearance: She is obese. She is not ill-appearing or diaphoretic.  HENT:     Head: Normocephalic and atraumatic.  Eyes:     Conjunctiva/sclera: Conjunctivae normal.  Cardiovascular:     Rate and Rhythm: Normal rate and regular rhythm.     Heart sounds: Normal heart sounds.  Pulmonary:     Effort: Pulmonary effort is normal.     Breath sounds: Normal breath sounds. No wheezing, rhonchi or rales.  Abdominal:     Palpations: Abdomen  is soft.     Tenderness: There is abdominal tenderness in the epigastric area and left upper quadrant. There is no right CVA tenderness, left CVA tenderness, guarding or rebound.  Musculoskeletal:     Cervical back: Neck supple.  Skin:    General: Skin is warm and dry.  Neurological:     Mental Status: She is alert.     ED Results / Procedures / Treatments   Labs (all labs ordered are listed, but only abnormal results are displayed) Labs Reviewed  LIPASE, BLOOD - Abnormal; Notable for the following components:      Result Value   Lipase 113 (*)    All other components within normal limits  COMPREHENSIVE METABOLIC PANEL - Abnormal; Notable for the following components:   Sodium 134 (*)    Potassium 3.1 (*)    CO2 20 (*)    Glucose, Bld 144 (*)    Calcium 10.8 (*)    Albumin 3.4 (*)    ALT 55 (*)    Alkaline Phosphatase 1,066 (*)    All other components within normal limits  CBC - Abnormal; Notable for the following components:   Hemoglobin 11.1 (*)    MCH 25.7 (*)    MCHC 29.4 (*)    RDW 19.4 (*)    All other components within normal limits  SARS CORONAVIRUS 2 BY RT PCR (HOSPITAL ORDER, [facility])  URINALYSIS, ROUTINE W REFLEX MICROSCOPIC  I-STAT BETA HCG BLOOD, ED ([facility], [facility], [facility] ONLY)  TROPONIN I (HIGH SENSITIVITY)  TROPONIN I (HIGH SENSITIVITY)    EKG None  Radiology CT Abdomen Pelvis W Contrast  Result Date: 02/09/2020 CLINICAL DATA:  History of pancreatitis. Abdominal pain. Nausea and vomiting. EXAM: CT ABDOMEN AND PELVIS WITH CONTRAST TECHNIQUE: Multidetector CT imaging of the abdomen and pelvis was performed using the standard protocol following bolus administration of intravenous contrast. CONTRAST:  122m OMNIPAQUE IOHEXOL 300 MG/ML  SOLN COMPARISON:  None. FINDINGS: Lower chest: Elevation of the left hemidiaphragm. Atelectasis at the lung bases left worse than right. Hepatobiliary: Liver parenchyma appears normal. Previous  cholecystectomy. Pancreas: Numerous cysts in the region of the pancreatic tail consistent with pseudocysts. The largest measures up to 10 cm in size. Numerous other smaller cysts. I do not see evidence of active pancreatitis such as swelling or retroperitoneal edema. Spleen: Normal Adrenals/Urinary Tract: No primary adrenal pathology is seen. 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 Small nonobstructing renal calculi on both sides. No renal parenchymal lesion. No hydronephrosis. Bladder is normal. Stomach/Bowel: Previous surgery at the gastroesophageal junction. Fluid-filled stomach. Dilated duodenum to the ligament of Treitz region, where the largest pseudo cyst could be  resulting in obstruction. Distal to that, there are a few dilated loops of jejunum, though not as pronounced. There is been previous small bowel anastomotic site surgery in that region. More distal small bowel is entirely normal. No evidence of colon obstruction or pathology. Vascular/Lymphatic: Aorta and IVC are normal. No retroperitoneal adenopathy. Reproductive: Uterus shows a leiomyoma projecting dorsally from the fundus measuring 3.3 cm. No ovarian lesion. Other: No free fluid or air. Musculoskeletal: Healing or nonunited fracture at the inferior pubic ramus on the right. No spinal pathology is seen. Osteoarthritis of the sacroiliac joints is noted. Endplate Schmorl's nodes are seen within the lumbar spine. IMPRESSION: Numerous chronic or subacute appearing pseudo cysts in the region of the pancreatic tail. The largest measures 10 cm in diameter. This is in the region of the ligament of Treitz and appears to be exerting some mass effect upon the small bowel at that level, possibly with relative small bowel obstruction. Numerous other small pseudocysts in the region of the pancreatic tail and retroperitoneum. Previous surgery at the gastroesophageal junction region. Previous small bowel anastomotic surgery in the jejunal region. Distal small  bowel appears normal. No suspicion of active/acute pancreatitis. Elevation of left hemidiaphragm. Basilar atelectasis left more than right. Electronically Signed   By: Nelson Chimes M.D.   On: 02/09/2020 20:36   DG Chest Port 1 View  Result Date: 02/09/2020 CLINICAL DATA:  Shortness of breath for several days EXAM: PORTABLE CHEST 1 VIEW COMPARISON:  None. FINDINGS: Cardiac shadow is mildly prominent but accentuated by the portable technique. Minimal left basilar atelectasis is noted. The left hemidiaphragm is elevated. No bony abnormality is seen. IMPRESSION: Mild left basilar atelectasis. Electronically Signed   By: Inez Catalina M.D.   On: 02/09/2020 19:34    Procedures Procedures (including critical care time)  Medications Ordered in ED Medications  sodium chloride flush (NS) 0.9 % injection 3 mL (3 mLs Intravenous Not Given 02/09/20 2036)  potassium chloride 10 mEq in 100 mL IVPB (10 mEq Intravenous New Bag/Given 02/09/20 2136)  sodium chloride 0.9 % bolus 1,000 mL (1,000 mLs Intravenous New Bag/Given 02/09/20 2036)  ondansetron (ZOFRAN) injection 4 mg (4 mg Intravenous Given 02/09/20 2034)  morphine 4 MG/ML injection 4 mg (4 mg Intravenous Given 02/09/20 2033)  iohexol (OMNIPAQUE) 300 MG/ML solution 100 mL (100 mLs Intravenous Contrast Given 02/09/20 2015)    ED Course  I have reviewed the triage vital signs and the nursing notes.  Pertinent labs & imaging results that were available during my care of the patient were reviewed by me and considered in my medical decision making (see chart for details).    MDM Rules/Calculators/A&P                          39 year old female who presents to the ED today with complaint of epigastric abdominal pain, nausea, vomiting for the past 5 days.  Associated chest tightness and shortness of breath.  Seen in urgent care this weekend and prescribed Macrobid for UTI however patient denies any urinary symptoms.  Seen at PCPs office today however sent here  for further evaluation.  Patient with history of cholecystectomy in the past as well as pancreatitis, last drink 2 weeks ago.  On arrival to the ED patient is afebrile and nontachypneic.  Noted to be tachycardic in the low 100s.  She is nontoxic-appearing.  Lab work was obtained while patient was in the waiting room.  CBC without leukocytosis.  CMP with an alk phos of 1066.  AST, ALT, total bili within normal limits.  Potassium 3.1.  Lipase 113.  On exam patient does have tenderness to the epigastrium as well as the left upper quadrant.  When her alk phos elevation and tenderness will obtain CT scan at this time to assess for any retained stones in the common bile duct causing elevated alk phos.  Would suspect lipase to be more elevated with acute pancreatitis.  Given chest tightness and shortness of breath will obtain chest x-ray, troponin.   CXR with mild left basilar atelectasis.  Troponin negative.  EKG without ischemic changes.   CT scan with findings:  IMPRESSION:  Numerous chronic or subacute appearing pseudo cysts in the region of  the pancreatic tail. The largest measures 10 cm in diameter. This is  in the region of the ligament of Treitz and appears to be exerting  some mass effect upon the small bowel at that level, possibly with  relative small bowel obstruction. Numerous other small pseudocysts  in the region of the pancreatic tail and retroperitoneum.    Previous surgery at the gastroesophageal junction region. Previous  small bowel anastomotic surgery in the jejunal region. Distal small  bowel appears normal.    No suspicion of active/acute pancreatitis.    Elevation of left hemidiaphragm. Basilar atelectasis left more than  right.   Pt has not vomited since early this morning. She again reports normal BMs without signs of constipation or obstipation consistent with SBO however pt with hx of multiple abdominal surgeries.   Discussed case with GI Dr. Fuller Plan who will come  evaluate pt in the AM. Does recommend surgical consult as well.   Discussed case with Dr. Georgette Dover who will plan to consult in the AM; no intervention required at this time from a surgical standpoint.   Discussed case with family medicine who will come evaluate patient for admission.   This note was prepared using Dragon voice recognition software and may include unintentional dictation errors due to the inherent limitations of voice recognition software.  Final Clinical Impression(s) / ED Diagnoses Final diagnoses:  Pseudocyst of pancreas  SBO (small bowel obstruction) ([place])  Hypokalemia    Rx / DC Orders ED Discharge Orders    None       Eustaquio Maize, PA-C 02/09/20 2243    Veryl Speak, MD 02/10/20 [phone]

## 2020-02-09 NOTE — ED Notes (Signed)
Patient transported to CT via stretcher.

## 2020-02-09 NOTE — Addendum Note (Signed)
Addended by: Richarda Osmond on: 02/09/2020 12:09 PM   Modules accepted: Orders

## 2020-02-10 ENCOUNTER — Encounter (HOSPITAL_COMMUNITY)
Admission: EM | Disposition: A | Discharge status: Home Health | Payer: Self-pay | Source: Ambulatory Visit | Attending: Family Medicine

## 2020-02-10 ENCOUNTER — Other Ambulatory Visit: Payer: Self-pay

## 2020-02-10 ENCOUNTER — Observation Stay (HOSPITAL_COMMUNITY): Payer: BC Managed Care – PPO | Admitting: Certified Registered"

## 2020-02-10 ENCOUNTER — Encounter (HOSPITAL_COMMUNITY): Payer: Self-pay | Admitting: Family Medicine

## 2020-02-10 DIAGNOSIS — Z9884 Bariatric surgery status: Secondary | ICD-10-CM | POA: Diagnosis not present

## 2020-02-10 DIAGNOSIS — K661 Hemoperitoneum: Secondary | ICD-10-CM | POA: Diagnosis present

## 2020-02-10 DIAGNOSIS — K2100 Gastro-esophageal reflux disease with esophagitis, without bleeding: Secondary | ICD-10-CM | POA: Diagnosis present

## 2020-02-10 DIAGNOSIS — K5651 Intestinal adhesions [bands], with partial obstruction: Secondary | ICD-10-CM | POA: Diagnosis present

## 2020-02-10 DIAGNOSIS — R0600 Dyspnea, unspecified: Secondary | ICD-10-CM | POA: Diagnosis not present

## 2020-02-10 DIAGNOSIS — R748 Abnormal levels of other serum enzymes: Secondary | ICD-10-CM | POA: Diagnosis present

## 2020-02-10 DIAGNOSIS — M7989 Other specified soft tissue disorders: Secondary | ICD-10-CM | POA: Diagnosis not present

## 2020-02-10 DIAGNOSIS — R1032 Left lower quadrant pain: Secondary | ICD-10-CM | POA: Diagnosis not present

## 2020-02-10 DIAGNOSIS — Z20822 Contact with and (suspected) exposure to covid-19: Secondary | ICD-10-CM | POA: Diagnosis present

## 2020-02-10 DIAGNOSIS — R0602 Shortness of breath: Secondary | ICD-10-CM | POA: Diagnosis not present

## 2020-02-10 DIAGNOSIS — D62 Acute posthemorrhagic anemia: Secondary | ICD-10-CM | POA: Diagnosis not present

## 2020-02-10 DIAGNOSIS — K631 Perforation of intestine (nontraumatic): Secondary | ICD-10-CM | POA: Diagnosis not present

## 2020-02-10 DIAGNOSIS — E871 Hypo-osmolality and hyponatremia: Secondary | ICD-10-CM | POA: Diagnosis present

## 2020-02-10 DIAGNOSIS — A419 Sepsis, unspecified organism: Secondary | ICD-10-CM | POA: Diagnosis not present

## 2020-02-10 DIAGNOSIS — R52 Pain, unspecified: Secondary | ICD-10-CM | POA: Diagnosis not present

## 2020-02-10 DIAGNOSIS — I10 Essential (primary) hypertension: Secondary | ICD-10-CM | POA: Diagnosis present

## 2020-02-10 DIAGNOSIS — I878 Other specified disorders of veins: Secondary | ICD-10-CM | POA: Diagnosis not present

## 2020-02-10 DIAGNOSIS — E8770 Fluid overload, unspecified: Secondary | ICD-10-CM | POA: Diagnosis not present

## 2020-02-10 DIAGNOSIS — K5669 Other partial intestinal obstruction: Secondary | ICD-10-CM | POA: Diagnosis present

## 2020-02-10 DIAGNOSIS — K9589 Other complications of other bariatric procedure: Secondary | ICD-10-CM | POA: Diagnosis present

## 2020-02-10 DIAGNOSIS — K863 Pseudocyst of pancreas: Secondary | ICD-10-CM | POA: Diagnosis present

## 2020-02-10 DIAGNOSIS — K861 Other chronic pancreatitis: Secondary | ICD-10-CM | POA: Diagnosis present

## 2020-02-10 DIAGNOSIS — K289 Gastrojejunal ulcer, unspecified as acute or chronic, without hemorrhage or perforation: Secondary | ICD-10-CM

## 2020-02-10 DIAGNOSIS — E878 Other disorders of electrolyte and fluid balance, not elsewhere classified: Secondary | ICD-10-CM | POA: Diagnosis present

## 2020-02-10 DIAGNOSIS — R1084 Generalized abdominal pain: Secondary | ICD-10-CM | POA: Diagnosis not present

## 2020-02-10 DIAGNOSIS — K56600 Partial intestinal obstruction, unspecified as to cause: Secondary | ICD-10-CM | POA: Diagnosis not present

## 2020-02-10 DIAGNOSIS — K56609 Unspecified intestinal obstruction, unspecified as to partial versus complete obstruction: Secondary | ICD-10-CM | POA: Diagnosis not present

## 2020-02-10 DIAGNOSIS — J9811 Atelectasis: Secondary | ICD-10-CM | POA: Diagnosis present

## 2020-02-10 DIAGNOSIS — R7989 Other specified abnormal findings of blood chemistry: Secondary | ICD-10-CM | POA: Diagnosis not present

## 2020-02-10 DIAGNOSIS — R109 Unspecified abdominal pain: Secondary | ICD-10-CM | POA: Diagnosis not present

## 2020-02-10 DIAGNOSIS — Z6841 Body Mass Index (BMI) 40.0 and over, adult: Secondary | ICD-10-CM | POA: Diagnosis not present

## 2020-02-10 DIAGNOSIS — Z9889 Other specified postprocedural states: Secondary | ICD-10-CM | POA: Diagnosis not present

## 2020-02-10 DIAGNOSIS — E876 Hypokalemia: Secondary | ICD-10-CM | POA: Diagnosis present

## 2020-02-10 DIAGNOSIS — K651 Peritoneal abscess: Secondary | ICD-10-CM | POA: Diagnosis present

## 2020-02-10 DIAGNOSIS — R600 Localized edema: Secondary | ICD-10-CM | POA: Diagnosis present

## 2020-02-10 HISTORY — PX: BIOPSY: SHX5522

## 2020-02-10 HISTORY — PX: ENTEROSCOPY: SHX5533

## 2020-02-10 LAB — COMPREHENSIVE METABOLIC PANEL
ALT: 61 U/L — ABNORMAL HIGH (ref 0–44)
AST: 64 U/L — ABNORMAL HIGH (ref 15–41)
Albumin: 3 g/dL — ABNORMAL LOW (ref 3.5–5.0)
Alkaline Phosphatase: 1006 U/L — ABNORMAL HIGH (ref 38–126)
Anion gap: 7 (ref 5–15)
BUN: 9 mg/dL (ref 6–20)
CO2: 22 mmol/L (ref 22–32)
Calcium: 10.4 mg/dL — ABNORMAL HIGH (ref 8.9–10.3)
Chloride: 108 mmol/L (ref 98–111)
Creatinine, Ser: 0.68 mg/dL (ref 0.44–1.00)
GFR calc Af Amer: >60 mL/min (ref >60)
GFR calc non Af Amer: >60 mL/min (ref >60)
Glucose, Bld: 141 mg/dL — ABNORMAL HIGH (ref 70–99)
Potassium: 3.6 mmol/L (ref 3.5–5.1)
Sodium: 137 mmol/L (ref 135–145)
Total Bilirubin: 1.2 mg/dL (ref 0.3–1.2)
Total Protein: 7 g/dL (ref 6.5–8.1)

## 2020-02-10 LAB — TROPONIN I (HIGH SENSITIVITY): Troponin I (High Sensitivity): 4 ng/L (ref <18)

## 2020-02-10 LAB — CBC
HCT: 35.1 % — ABNORMAL LOW (ref 36.0–46.0)
Hemoglobin: 10.3 g/dL — ABNORMAL LOW (ref 12.0–15.0)
MCH: 25.4 pg — ABNORMAL LOW (ref 26.0–34.0)
MCHC: 29.3 g/dL — ABNORMAL LOW (ref 30.0–36.0)
MCV: 86.7 fL (ref 80.0–100.0)
Platelets: 384 10*3/uL (ref 150–400)
RBC: 4.05 MIL/uL (ref 3.87–5.11)
RDW: 19.5 % — ABNORMAL HIGH (ref 11.5–15.5)
WBC: 9.6 10*3/uL (ref 4.0–10.5)
nRBC: 0 % (ref 0.0–0.2)

## 2020-02-10 LAB — URINALYSIS, ROUTINE W REFLEX MICROSCOPIC
Glucose, UA: NEGATIVE mg/dL
Ketones, ur: NEGATIVE mg/dL
Leukocytes,Ua: NEGATIVE
Nitrite: POSITIVE — AB
Protein, ur: 30 mg/dL — AB
Specific Gravity, Urine: 1.01 (ref 1.005–1.030)
pH: 6.5 (ref 5.0–8.0)

## 2020-02-10 LAB — URINALYSIS, MICROSCOPIC (REFLEX)

## 2020-02-10 LAB — HEMOGLOBIN A1C
Hgb A1c MFr Bld: 4.4 % — ABNORMAL LOW (ref 4.8–5.6)
Mean Plasma Glucose: 79.58 mg/dL

## 2020-02-10 LAB — MAGNESIUM: Magnesium: 1.9 mg/dL (ref 1.7–2.4)

## 2020-02-10 LAB — VITAMIN B12: Vitamin B-12: 261 pg/mL (ref 180–914)

## 2020-02-10 LAB — FOLATE: Folate: 8.9 ng/mL (ref >5.9)

## 2020-02-10 LAB — GAMMA GT: GGT: 116 U/L — ABNORMAL HIGH (ref 7–50)

## 2020-02-10 SURGERY — ENTEROSCOPY
Anesthesia: Monitor Anesthesia Care

## 2020-02-10 MED ORDER — SODIUM CHLORIDE 0.9 % IV SOLN: INTRAVENOUS | Status: DC

## 2020-02-10 MED ORDER — ONDANSETRON HCL 4 MG/2ML IJ SOLN
INTRAMUSCULAR | Status: AC
  Filled 2020-02-10: qty 2

## 2020-02-10 MED ORDER — ONDANSETRON HCL 4 MG/2ML IJ SOLN
4.0000 mg | Freq: Four times a day (QID) | INTRAMUSCULAR | Status: DC | PRN
  Administered 2020-02-10 – 2020-02-17 (×13): 4 mg via INTRAVENOUS
  Filled 2020-02-10 (×14): qty 2

## 2020-02-10 MED ORDER — FENTANYL CITRATE (PF) 100 MCG/2ML IJ SOLN
INTRAMUSCULAR | Status: AC
  Filled 2020-02-10: qty 2

## 2020-02-10 MED ORDER — PROPOFOL 10 MG/ML IV BOLUS
INTRAVENOUS | Status: DC | PRN
  Administered 2020-02-10 (×3): 20 mg via INTRAVENOUS

## 2020-02-10 MED ORDER — SUCRALFATE 1 GM/10ML PO SUSP
1.0000 g | Freq: Three times a day (TID) | ORAL | Status: DC
  Administered 2020-02-10 – 2020-02-14 (×14): 1 g via ORAL
  Filled 2020-02-10 (×14): qty 10

## 2020-02-10 MED ORDER — MORPHINE SULFATE (PF) 2 MG/ML IV SOLN
2.0000 mg | INTRAVENOUS | Status: DC | PRN
  Administered 2020-02-10 – 2020-02-11 (×7): 2 mg via INTRAVENOUS
  Filled 2020-02-10 (×7): qty 1

## 2020-02-10 MED ORDER — PROPOFOL 500 MG/50ML IV EMUL
INTRAVENOUS | Status: DC | PRN
Start: 2020-02-10 — End: 2020-02-10
  Administered 2020-02-10: 200 ug/kg/min via INTRAVENOUS

## 2020-02-10 MED ORDER — MORPHINE SULFATE (PF) 2 MG/ML IV SOLN
1.0000 mg | INTRAVENOUS | Status: DC | PRN
  Administered 2020-02-10: 1 mg via INTRAVENOUS
  Filled 2020-02-10: qty 1

## 2020-02-10 MED ORDER — PANTOPRAZOLE SODIUM 40 MG PO TBEC
40.0000 mg | DELAYED_RELEASE_TABLET | Freq: Two times a day (BID) | ORAL | Status: DC
  Administered 2020-02-10 – 2020-02-13 (×7): 40 mg via ORAL
  Filled 2020-02-10 (×7): qty 1

## 2020-02-10 NOTE — Transfer of Care (Signed)
Immediate Anesthesia Transfer of Care Note  Patient: Alexis Avila  Procedure(s) Performed: ENTEROSCOPY (N/A ) BIOPSY  Patient Location: Endoscopy Unit  Anesthesia Type:MAC  Level of Consciousness: awake, alert  and oriented  Airway & Oxygen Therapy: Patient Spontanous Breathing  Post-op Assessment: Report given to RN, Post -op Vital signs reviewed and stable and Patient moving all extremities  Post vital signs: Reviewed and stable  Last Vitals:  Vitals Value Taken Time  BP    Temp    Pulse 105 02/10/20 1418  Resp 30 02/10/20 1418  SpO2 96 % 02/10/20 1418  Vitals shown include unvalidated device data.  Last Pain:  Vitals:   02/10/20 1329  TempSrc: Oral  PainSc: 0-No pain      Patients Stated Pain Goal: 2 (33/54/56 2563)  Complications: No complications documented.

## 2020-02-10 NOTE — Progress Notes (Signed)
Pt with nausea and vomiting, emesis bag and tissue paper given, olivia, crna notified, ok to give iv zofran, 4mg , see mar for times, safety maintained

## 2020-02-10 NOTE — Progress Notes (Signed)
Arrived to 6n20 from ED. Ambulated from stretcher to bed-gait steady. C/O nausea / upper abd pain. Will continue to monitor.

## 2020-02-10 NOTE — Progress Notes (Signed)
Family Medicine Teaching Service Daily Progress Note Intern Pager: [phone]  Patient name: Alexis Avila Medical record number: [id number] Date of birth: 24-Jul-1980 Age: 39 y.o. Gender: female  Primary Care Provider: [name], MD Consultants: GI, Surg Code Status: FULL  Pt Overview and Major Events to Date:  7/28 Admitted  Assessment and Plan: Alexis Avila is a 39 y.o. 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 PMH is significant for GERD and pancreatitis.  Partial SBO  pancreatic pseudocyst Patient continues to report abdominal pain and reports that her pain is not being alleviated on 1mg  morphine. HR 96-120. BP 114/78-130/74. Repeat Trp  4, remaining WNL. Morning Hgb 10.3 around baseline. AM Na 137, K+ 3.6, Ca corrected, elevated,11.2, Albumin 3.0. CT Abdomen and Pelvis suggested numerous chronic or subacute appearing pseudocysts in the pancreatic tail with the largest measures 10 cm in diameter. It is in the region of the ligament of Treitz and appears to be exerting some mass effect upon the small bowel at this level, w/ possible SBO. There is no suspicion of active/acute pancreatitis. Avoiding significant levels of opioids, noticed to have low threshold for altered mentation with them. Surgery saw patient in AM, do not recommend NGT at this time unless patient starts vomiting more. Surgery recommends a PICC or TPN is she remains unable to tolerate PO. They do not see any indication for acute surgical intervention at this time. GI saw patient this AM and agree with supportive care w/ mIVF and pan and nausea management. Ice chips will be allowed. - Vitals per floor protocol - mIVF -A.m. CMP/CBC -Follow-up GI and surgery recs  -Increase morphine to 2 mg every 2 as needed for pain -Continue Zofran as needed for nausea (QTc 446) - NPO -SCDs for DVT prophylaxis  Elevated alkaline phosphatase 02/10/2020 ALP  1,066, AST 29>64, ALT 55>61.  Lipase 113. GGT 116.  Likely in the setting of known pancreatic pseudocysts and partial SBO that could be causing blockage in biliary system. Surgery has evaluated patient and at this time believe pseudocyst is compressing duodenum vs reactive ileus from pancreatitis - they think ileus is less likely however given that distal small bowel is all decompressed. - per patient, patient had BM 1 day prior to admission   Shortness of breath Patient does not endorse shortness of breath. CXR  was significant for mild left basilar atelectasis. The left hemidiaphragm is elevated. Patient sounded clear on exam and was satting 94-99% on RA.  - cont to monitor  Recent diagnosis of UTI Patient reports no pain w/ urination. WBC 9.6. Patient remains a febrile. There does not appear to be signs of active infection at this time. UA was positive for nitrites, protein, moderate bilirubin. Urine had small Hgb, cloudy and orange; however patient is currently menstruating. -Discontinue nitrofurantoin   Hypokalemia  Mild Hyponatremia- improved Patient responded well to K+ AM K+ 3.6 w/ Na 137 . Mg 1.9.  - AM CMP  Normocytic anemia  history of B12 deficiency anemia  s/p gastric bypass Am Hgb 10.3 .  Last hemoglobin in chart on 07/20/2019 was 10.6. B12 261, Folate 8.9.  Patient is currently menstruating. -Transfusion threshold of 7 due to lack of CAD/PAD comorbidities -Follow-up morning CBC -Follow-up B12, folate levels  Hyperglycemia AM BGL 141. HgbA1c 4.4    Borderline normal EKG Admission EKG showed NSR, borderline conduction delays, appears unchanged from previous.  AM EKG was sinus tachy with QTc 446. There was  signs of posterior and anterior infarct of undetermined age.    Hx pancreatitis  Heavy alcohol use In 2018, with hospitalization.  Etiology of heavy alcohol use.  She has since cut back, now drinking twice a week with reported 3-4 drinks per episode.  Drink of  choice white wine or vodka.  Last drink 2 weeks ago. Most recent CIWA 7.   Labs as above. - monitor CIWAs without ativan  GERD Currently no complaints of heartburn. -Consider Tums or Protonix as needed for symptoms   FEN/GI: NPO, ice chips PPx: SCDs  Disposition: Med- Surg  Subjective:  Patient is resting in bed. She reports abdominal pain, nausea and vomiting. Although she feels that the vomiting is decreasing and reports that the emesis is clear.  Objective: Temp:  [96.4 F (35.8 C)-98.5 F (36.9 C)] 98.5 F (36.9 C) (07/29 0425) Pulse Rate:  [96-120] 96 (07/29 0425) Resp:  [16-25] 17 (07/29 0425) BP: (114-130)/(72-95) 118/90 (07/29 0425) SpO2:  [94 %-99 %] 98 % (07/29 0425) Weight:  [124.6 kg-125.2 kg] 125.2 kg (07/29 0425) Physical Exam: General: Appropriate mood and affect. Resting in bed. NAD. Cardiovascular: Regular rate and rhythm, no murmur detected Respiratory: Clear and equal BIL, normal work of breathing noted Abdomen: Pain to Palpation of the RUQ and LUQ, positive murphys sign, normoactive bowel sounds Extremities: Distal pulses intact, no edema noted BIL  Laboratory: Recent Labs  Lab 02/09/20 1226 02/10/20 0500  WBC 10.5 9.6  HGB 11.1* 10.3*  HCT 37.7 35.1*  PLT 396 384   Recent Labs  Lab 02/09/20 1226 02/10/20 0500  NA 134* 137  K 3.1* 3.6  CL 104 108  CO2 20* 22  BUN 9 9  CREATININE 0.62 0.68  CALCIUM 10.8* 10.4*  PROT 7.7 7.0  BILITOT 1.0 1.2  ALKPHOS 1,066* 1,006*  ALT 55* 61*  AST 29 64*  GLUCOSE 144* 141*    Lipase 113 GGT 116  Imaging/Diagnostic Tests: CT Abdomen Pelvis W Contrast  Result Date: 02/09/2020 CLINICAL DATA:  History of pancreatitis. Abdominal pain. Nausea and vomiting. EXAM: CT ABDOMEN AND PELVIS WITH CONTRAST TECHNIQUE: Multidetector CT imaging of the abdomen and pelvis was performed using the standard protocol following bolus administration of intravenous contrast. CONTRAST:  128mL OMNIPAQUE IOHEXOL 300 MG/ML   SOLN COMPARISON:  None. FINDINGS: Lower chest: Elevation of the left hemidiaphragm. Atelectasis at the lung bases left worse than right. Hepatobiliary: Liver parenchyma appears normal. Previous cholecystectomy. Pancreas: Numerous cysts in the region of the pancreatic tail consistent with pseudocysts. The largest measures up to 10 cm in size. Numerous other smaller cysts. I do not see evidence of active pancreatitis such as swelling or retroperitoneal edema. Spleen: Normal Adrenals/Urinary Tract: No primary adrenal pathology is seen. 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 Small nonobstructing renal calculi on both sides. No renal parenchymal lesion. No hydronephrosis. Bladder is normal. Stomach/Bowel: Previous surgery at the gastroesophageal junction. Fluid-filled stomach. Dilated duodenum to the ligament of Treitz region, where the largest pseudo cyst could be resulting in obstruction. Distal to that, there are a few dilated loops of jejunum, though not as pronounced. There is been previous small bowel anastomotic site surgery in that region. More distal small bowel is entirely normal. No evidence of colon obstruction or pathology. Vascular/Lymphatic: Aorta and IVC are normal. No retroperitoneal adenopathy. Reproductive: Uterus shows a leiomyoma projecting dorsally from the fundus measuring 3.3 cm. No ovarian lesion. Other: No free fluid or air. Musculoskeletal: Healing or nonunited fracture at the inferior pubic  ramus on the right. No spinal pathology is seen. Osteoarthritis of the sacroiliac joints is noted. Endplate Schmorl's nodes are seen within the lumbar spine. IMPRESSION: Numerous chronic or subacute appearing pseudo cysts in the region of the pancreatic tail. The largest measures 10 cm in diameter. This is in the region of the ligament of Treitz and appears to be exerting some mass effect upon the small bowel at that level, possibly with relative small bowel obstruction. Numerous other small pseudocysts in  the region of the pancreatic tail and retroperitoneum. Previous surgery at the gastroesophageal junction region. Previous small bowel anastomotic surgery in the jejunal region. Distal small bowel appears normal. No suspicion of active/acute pancreatitis. Elevation of left hemidiaphragm. Basilar atelectasis left more than right. Electronically Signed   By: Nelson Chimes M.D.   On: 02/09/2020 20:36   DG Chest Port 1 View  Result Date: 02/09/2020 CLINICAL DATA:  Shortness of breath for several days EXAM: PORTABLE CHEST 1 VIEW COMPARISON:  None. FINDINGS: Cardiac shadow is mildly prominent but accentuated by the portable technique. Minimal left basilar atelectasis is noted. The left hemidiaphragm is elevated. No bony abnormality is seen. IMPRESSION: Mild left basilar atelectasis. Electronically Signed   By: Inez Catalina M.D.   On: 02/09/2020 19:34    Freida Busman, MD 02/10/2020, 6:41 AM PGY-1, [place] Intern pager: [phone], text pages welcome

## 2020-02-10 NOTE — Op Note (Signed)
[facility] Patient Name: Alexis Avila Procedure Date : 02/10/2020 MRN: [id number] Attending MD: Gerrit Heck , MD Date of Birth: Feb 18, 1981 CSN: [id number] Age: 39 Admit Type: Inpatient Procedure:                Small bowel enteroscopy Indications:              Unexplained epigastric abdominal distress/pain,                            Abnormal abdominal CT, Nausea with vomiting                           39 yo female with history of gastric bypass in                            2002, pancreatitis in 2018 presents with recent                            onset upper abdominal pain, nausea, non-bloody                            emesis. Admission CT with large pancreatic                            pseudocysts with extraluminal compression of the                            small bowel. Providers:                Gerrit Heck, MD, Wynonia Sours, RN, Tyna Jaksch Technician Referring MD:              Medicines:                Monitored Anesthesia Care Complications:            No immediate complications. Estimated Blood Loss:     Estimated blood loss was minimal. Procedure:                Pre-Anesthesia Assessment:                           - Prior to the procedure, a History and Physical                            was performed, and patient medications and                            allergies were reviewed. The patient's tolerance of                            previous anesthesia was also reviewed. The risks                            and benefits of the procedure and the sedation  options and risks were discussed with the patient.                            All questions were answered, and informed consent                            was obtained. Prior Anticoagulants: The patient has                            taken no previous anticoagulant or antiplatelet                            agents. ASA Grade Assessment: II -  A patient with                            mild systemic disease. After reviewing the risks                            and benefits, the patient was deemed in                            satisfactory condition to undergo the procedure.                           After obtaining informed consent, the endoscope was                            passed under direct vision. Throughout the                            procedure, the patient's blood pressure, pulse, and                            oxygen saturations were monitored continuously. The                            GIF-H190 ([id number] Olympus gastroscope was                            introduced through the mouth and advanced to the                            proximal jejunum. The small bowel enteroscopy was                            accomplished without difficulty. The patient                            tolerated the procedure well. Scope In: Scope Out: Findings:      LA Grade B (one or more mucosal breaks greater than 5 mm, not extending       between the tops of two mucosal folds) esophagitis with no bleeding was       found in the lower third of the esophagus.  Evidence of a Roux-en-Y gastrojejunostomy was found. There was a small       diverticulum in the gastric body adjacent to the anastamosis. The       gastrojejunal anastomosis was characterized by 2 marginal ulcers, the       larger being approximately 20 mm in size. Both with clean base. The       anastamosis was otherwise patent and easily traversed. Biopsies were       taken from the ulcers with a cold forceps for histology. There was a       single surgical staple noted in the blind pouch which was removed with       forceps. Estimated blood loss was minimal.      There was no evidence of significant pathology in the entire examined       portion of duodenum and jejunum. Advanced the endoscope approximately 35       cm beyond the anastamosis without any significant areas of  extraluminal       compression. Impression:               - LA Grade B reflux esophagitis with no bleeding.                           - Roux-en-Y gastrojejunostomy with gastrojejunal                            anastomosis characterized by ulceration. Biopsied.                            Single surgical staple noted and removed.                           - The examined portion of the jejunum was normal. Recommendation:           - Return patient to hospital ward for ongoing care.                           - NPO. Ice chips ok for now.                           - Use Protonix (pantoprazole) 40 mg PO BID for at                            least 8 weeks to promote healing of the large                            marginal ulcer.                           - Use sucralfate suspension 1 gram PO QID.                           - Depending on response to therapy, can discuss                            whether or not the pancreatic pseudocysts require  further endoscopic intervention (ie, pancreatic                            cystgastrostomy). Procedure Code(s):        --- Professional ---                           [phone], Small intestinal endoscopy, enteroscopy                            beyond second portion of duodenum, not including                            ileum; with biopsy, single or multiple Diagnosis Code(s):        --- Professional ---                           K21.00, Gastro-esophageal reflux disease with                            esophagitis, without bleeding                           Z98.0, Intestinal bypass and anastomosis status                           R10.13, Epigastric pain                           R11.2, Nausea with vomiting, unspecified                           R93.3, Abnormal findings on diagnostic imaging of                            other parts of digestive tract CPT copyright 2019 American Medical Association. All rights reserved. The codes  documented in this report are preliminary and upon coder review may  be revised to meet current compliance requirements. Gerrit Heck, MD 02/10/2020 2:32:37 PM Number of Addenda: 0

## 2020-02-10 NOTE — Consult Note (Signed)
[facility] Consult: 8:25 AM 02/10/2020  LOS: 0 days    Referring Provider: Dorcas Mcmurray MD  Primary Care Physician:  [name], MD Primary Gastroenterologist:  unassigned     Reason for Consultation:  Pancreatic pseudocysts   HPI: Alexis Avila is a 39 y.o. female. Relocated from [place] to [place] within last 8 months.  Cholecystectomy 2013 not associated with pancreatitis.  alcoholic pancreatitis ~ 1610, she was hospitalized for 2 days and suffered no sequela, did not undergo any endoscopic diagnostic testing.  S/p bariatric gastric bypass 2002. Obesity. GERD.  Anemia, B12 deficiency.  Ovarian cysts.  Chronic back pain.     Ros patient has no chronic or recurrent problems.  Beginning Friday she lost her appetite and began spitting up, dry heaving foamy/nonbloody material.  This persisted and with every attempt at p.o. she would vomit or spit up.  Then developed pain in the epigastric area which at times radiates into the lower abdomen.  It is not intense but is associated with bloating. Contemplated visit to ED 7/25, too long of a wait so went/seen at [facility] w vomiting, brown at times, migrating abd pain.  + cough.  Rx Nitrofurantoin and promethazine for UTI.  But sxs persisted and returned to ED last night + Fatigue, weakness, shortness of breath, no angina.  No fever.  Soft BPs 1teens/70s to 90.  HR to low 100s.  sats >95%.   T bili 1.2.  Alk phos 1006.  Transaminases 64/61.  Lipase 113.  Renal fx wnl.  K 3.1 >> 3.6.  Na 134 >> 137WBCs 10.5. Hgb 10.3, was 10.6 in Jan 2021.    CTAP w contrast:  Fluid filled stomach.  S/p cholecystectomy.  Liver unremarkable.   Multiple pseudocysts in TOP, largest 10 cm which may be cause of obstruction and dilated duodenum at lig of Treitz. No acute pancreatitis. Post-op changes at [facility], and SB anastomosis.  Mild dilation of some loops of jejunum, more distal SB normal. Uterine Leiomyoma. Healing or non-united fx at inf R pubic rami.  SI joint osteoarthritis bil.   CXR:BB atx.     At maximum she weighed 330 lbs prior to her gastric bypass.  She lost down to 230 lbs but felt uncomfortable at that weight and has since gained.  Wt 296 #/134.3 kg in 07/2019.  125.2 kg now.    Social Pt has says that she drinks a few times a month 1-3 drinks at the most.  Last drink two or more weeks ago. Smokes 2 to 3 cigarettes a day.  She works from home for [employer].  Family history Mother also has undergone bariatric gastric bypass.  There is no family history of pancreatic, liver, gallbladder, gastric diseases or colorectal cancers.    Past Medical History:  Diagnosis Date  . Allergy     Past Surgical History:  Procedure Laterality Date  . CHOLECYSTECTOMY  2013   crystalized gallbaldder  . GASTRIC BYPASS  2002    Prior to Admission medications   Medication Sig Start Date End  Date Taking? Authorizing Provider  nitrofurantoin (MACRODANTIN) 100 MG capsule Take 100 mg by mouth 2 (two) times daily.   Yes [provider]  promethazine (PHENERGAN) 12.5 MG tablet Take 12.5 mg by mouth every 6 (six) hours as needed for nausea or vomiting.   Yes [provider]    Scheduled Meds: . sodium chloride flush  3 mL Intravenous Once   Infusions: . sodium chloride 165 mL/hr at 02/10/20 0130   PRN Meds: morphine injection, ondansetron (ZOFRAN) IV   Allergies as of 02/09/2020 - Review Complete 02/09/2020  Allergen Reaction Noted  . Penicillins Hives 07/20/2019    Family History  Problem Relation Age of Onset  . COPD Sister   . Heart disease Sister     Social History   Socioeconomic History  . Marital status: Single    Spouse name: Not on file  . Number of children: Not on file  . Years of education: Not on file  . Highest education level:  Not on file  Occupational History  . Not on file  Tobacco Use  . Smoking status: Current Every Day Smoker    Packs/day: 0.50    Years: 5.00    Pack years: 2.50    Types: Cigarettes  . Smokeless tobacco: Never Used  Substance and Sexual Activity  . Alcohol use: Yes    Alcohol/week: 13.0 standard drinks    Types: 5 Glasses of wine, 4 Cans of beer, 4 Shots of liquor per week  . Drug use: Not on file  . Sexual activity: Yes    Birth control/protection: None  Other Topics Concern  . Not on file  Social History Narrative  . Not on file   Social Determinants of Health   Financial Resource Strain:   . Difficulty of Paying Living Expenses:   Food Insecurity:   . Worried About Charity fundraiser in the Last Year:   . Arboriculturist in the Last Year:   Transportation Needs:   . Film/video editor (Medical):   Marland Kitchen Lack of Transportation (Non-Medical):   Physical Activity:   . Days of Exercise per Week:   . Minutes of Exercise per Session:   Stress:   . Feeling of Stress :   Social Connections:   . Frequency of Communication with Friends and Family:   . Frequency of Social Gatherings with Friends and Family:   . Attends Religious Services:   . Active Member of Clubs or Organizations:   . Attends Archivist Meetings:   Marland Kitchen Marital Status:   Intimate Partner Violence:   . Fear of Current or Ex-Partner:   . Emotionally Abused:   Marland Kitchen Physically Abused:   . Sexually Abused:     REVIEW OF SYSTEMS: Constitutional: See HPI. ENT:  No nose bleeds Pulm: Recent shortness of breath, not present today. CV:  No palpitations, no LE edema.  Chest pain, not angina. GU:  No hematuria, no frequency GI: See HPI. Heme:  Low b12 level in 07/2019, RX with weekly b12 x 4.    Transfusions: None Neuro:  No headaches, no peripheral tingling or numbness.  No syncope, no seizures.  Slight dizziness, not present today. Derm:  No itching, no rash or sores.  Endocrine:  No sweats or chills.   No polyuria or dysuria Immunization: Has not received COVID-19 vaccination Travel:  None beyond local counties in last few months.    PHYSICAL EXAM: Vital signs in last 24 hours: Vitals:  02/10/20 0425 02/10/20 0810  BP: (!) 118/90 119/72  Pulse: 96 99  Resp: 17 18  Temp: 98.5 F (36.9 C) 98 F (36.7 C)  SpO2: 98% 97%   Wt Readings from Last 3 Encounters:  02/10/20 (!) 125.2 kg  02/09/20 (!) 124.6 kg  07/20/19 134.3 kg    General: Pleasant, comfortable, obese.  Good historian. Head: No facial asymmetry or swelling.  No signs of head trauma. Eyes: No scleral icterus.  No conjunctival pallor. Ears: Not hard of hearing Nose: No congestion or discharge Mouth: Oral mucosa moist, pink, clear.  Good dental health with some fillings.  Tongue midline. Neck: No JVD, no masses, no thyromegaly. Lungs: Diminished at bases but clear bilaterally.  No labored breathing, no cough. Heart: RRR.  No MRG.  S1, S2 present. Abdomen: Obese, soft.  Tender in the epigastrium and bilateral upper quadrants.  No guarding or rebound.  Bowel sounds hypoactive no tinkling or tympanic bowel sounds.  Soft..   Rectal: Deferred. Musc/Skeltl: No joint redness, swelling or gross deformities. Extremities: No CCE. Neurologic: Alert.  Oriented x3.  Moves all four limbs, no weakness or deficits.  No tremors. Skin: No rashes, no sores, no deficits. Tattoos: Yes Nodes: No cervical adenopathy Psych: Pleasant, cooperative, calm.  Fluid speech.  Affect normal, not depressed, not anxious or agitated.  Intake/Output from previous day: No intake/output data recorded. Intake/Output this shift: No intake/output data recorded.  LAB RESULTS: Recent Labs    02/09/20 1226 02/10/20 0500  WBC 10.5 9.6  HGB 11.1* 10.3*  HCT 37.7 35.1*  PLT 396 384   BMET Lab Results  Component Value Date   NA 137 02/10/2020   NA 134 (L) 02/09/2020   K 3.6 02/10/2020   K 3.1 (L) 02/09/2020   CL 108 02/10/2020   CL 104  02/09/2020   CO2 22 02/10/2020   CO2 20 (L) 02/09/2020   GLUCOSE 141 (H) 02/10/2020   GLUCOSE 144 (H) 02/09/2020   BUN 9 02/10/2020   BUN 9 02/09/2020   CREATININE 0.68 02/10/2020   CREATININE 0.62 02/09/2020   CALCIUM 10.4 (H) 02/10/2020   CALCIUM 10.8 (H) 02/09/2020   LFT Recent Labs    02/09/20 1226 02/10/20 0500  PROT 7.7 7.0  ALBUMIN 3.4* 3.0*  AST 29 64*  ALT 55* 61*  ALKPHOS 1,066* 1,006*  BILITOT 1.0 1.2   PT/INR No results found for: INR, PROTIME Hepatitis Panel No results for input(s): HEPBSAG, HCVAB, HEPAIGM, HEPBIGM in the last 72 hours. C-Diff No components found for: CDIFF Lipase     Component Value Date/Time   LIPASE 113 (H) 02/09/2020 1226    Drugs of Abuse  No results found for: LABOPIA, COCAINSCRNUR, LABBENZ, AMPHETMU, THCU, LABBARB   RADIOLOGY STUDIES: CT Abdomen Pelvis W Contrast  Result Date: 02/09/2020 CLINICAL DATA:  History of pancreatitis. Abdominal pain. Nausea and vomiting. EXAM: CT ABDOMEN AND PELVIS WITH CONTRAST TECHNIQUE: Multidetector CT imaging of the abdomen and pelvis was performed using the standard protocol following bolus administration of intravenous contrast. CONTRAST:  16m OMNIPAQUE IOHEXOL 300 MG/ML  SOLN COMPARISON:  None. FINDINGS: Lower chest: Elevation of the left hemidiaphragm. Atelectasis at the lung bases left worse than right. Hepatobiliary: Liver parenchyma appears normal. Previous cholecystectomy. Pancreas: Numerous cysts in the region of the pancreatic tail consistent with pseudocysts. The largest measures up to 10 cm in size. Numerous other smaller cysts. I do not see evidence of active pancreatitis such as swelling or retroperitoneal edema. Spleen: Normal Adrenals/Urinary Tract: No  primary adrenal pathology is seen. 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 Small nonobstructing renal calculi on both sides. No renal parenchymal lesion. No hydronephrosis. Bladder is normal. Stomach/Bowel: Previous surgery at the  gastroesophageal junction. Fluid-filled stomach. Dilated duodenum to the ligament of Treitz region, where the largest pseudo cyst could be resulting in obstruction. Distal to that, there are a few dilated loops of jejunum, though not as pronounced. There is been previous small bowel anastomotic site surgery in that region. More distal small bowel is entirely normal. No evidence of colon obstruction or pathology. Vascular/Lymphatic: Aorta and IVC are normal. No retroperitoneal adenopathy. Reproductive: Uterus shows a leiomyoma projecting dorsally from the fundus measuring 3.3 cm. No ovarian lesion. Other: No free fluid or air. Musculoskeletal: Healing or nonunited fracture at the inferior pubic ramus on the right. No spinal pathology is seen. Osteoarthritis of the sacroiliac joints is noted. Endplate Schmorl's nodes are seen within the lumbar spine. IMPRESSION: Numerous chronic or subacute appearing pseudo cysts in the region of the pancreatic tail. The largest measures 10 cm in diameter. This is in the region of the ligament of Treitz and appears to be exerting some mass effect upon the small bowel at that level, possibly with relative small bowel obstruction. Numerous other small pseudocysts in the region of the pancreatic tail and retroperitoneum. Previous surgery at the gastroesophageal junction region. Previous small bowel anastomotic surgery in the jejunal region. Distal small bowel appears normal. No suspicion of active/acute pancreatitis. Elevation of left hemidiaphragm. Basilar atelectasis left more than right. Electronically Signed   By: Nelson Chimes M.D.   On: 02/09/2020 20:36   DG Chest Port 1 View  Result Date: 02/09/2020 CLINICAL DATA:  Shortness of breath for several days EXAM: PORTABLE CHEST 1 VIEW COMPARISON:  None. FINDINGS: Cardiac shadow is mildly prominent but accentuated by the portable technique. Minimal left basilar atelectasis is noted. The left hemidiaphragm is elevated. No bony  abnormality is seen. IMPRESSION: Mild left basilar atelectasis. Electronically Signed   By: Inez Catalina M.D.   On: 02/09/2020 19:34     IMPRESSION:   *  Abd pain, n/v.  Elevated alk phos, lesser elevation transaminases and Lipase.   Pancreatic pseudocysts per CT, one of which appears to be causing partial duodenal obsruction.  Hx ETOH pancreatitis w/o complications 1443.  Still consumes alcohol on a moderate level  *   S/p gastric bypass 2002 w succesful wt loss but pt remains obese, wt down 9 kg from Jan.    *   Hypokalemia, resolved.    *   Normocytic anemia.       PLAN:     *  Supportive care w IVF, pain and nausea mgt Agree w surgical PA that pt ok for ice chips.  Do not see need for NGT at present.   If vomiting worsens or pain becomes unmanageable, consider placement NG tube to low intermittent suction.  *   Surgical PA has seen the patient, no indication for surgical intervention at present.   Azucena Freed  02/10/2020, 8:25 AM Phone [phone]

## 2020-02-10 NOTE — Interval H&P Note (Signed)
History and Physical Interval Note:  02/10/2020 1:47 PM  Alexis Avila  has presented today for surgery, with the diagnosis of evaluate for duodenal  obstruction.  The various methods of treatment have been discussed with the patient and family. After consideration of risks, benefits and other options for treatment, the patient has consented to  Procedure(s): ENTEROSCOPY (N/A) as a surgical intervention.  The patient's history has been reviewed, patient examined, no change in status, stable for surgery.  I have reviewed the patient's chart and labs.  Questions were answered to the patient's satisfaction.     Dominic Pea Erminio Nygard

## 2020-02-10 NOTE — Anesthesia Procedure Notes (Signed)
Procedure Name: MAC Date/Time: 02/10/2020 1:44 PM Performed by: Amadeo Garnet, CRNA Pre-anesthesia Checklist: Patient identified, Emergency Drugs available, Patient being monitored and Suction available Patient Re-evaluated:Patient Re-evaluated prior to induction Oxygen Delivery Method: Nasal cannula Preoxygenation: Pre-oxygenation with 100% oxygen Induction Type: IV induction Placement Confirmation: positive ETCO2 Dental Injury: Teeth and Oropharynx as per pre-operative assessment

## 2020-02-10 NOTE — Anesthesia Preprocedure Evaluation (Signed)
Anesthesia Evaluation  Patient identified by MRN, date of birth, ID band Patient awake    Reviewed: Allergy & Precautions, H&P , NPO status , Patient's Chart, lab work & pertinent test results  Airway Mallampati: II   Neck ROM: full    Dental   Pulmonary Current Smoker and Patient abstained from smoking.,    breath sounds clear to auscultation       Cardiovascular negative cardio ROS   Rhythm:regular Rate:Normal     Neuro/Psych    GI/Hepatic GERD  ,Pancreatic pseudocyst. Pancreatitis. H/o gastric bypass   Endo/Other  Morbid obesity  Renal/GU      Musculoskeletal   Abdominal   Peds  Hematology  (+) Blood dyscrasia, anemia ,   Anesthesia Other Findings   Reproductive/Obstetrics                             Anesthesia Physical Anesthesia Plan  ASA: III  Anesthesia Plan: MAC   Post-op Pain Management:    Induction: Intravenous  PONV Risk Score and Plan: 1 and Propofol infusion and Treatment may vary due to age or medical condition  Airway Management Planned: Nasal Cannula  Additional Equipment:   Intra-op Plan:   Post-operative Plan:   Informed Consent: I have reviewed the patients History and Physical, chart, labs and discussed the procedure including the risks, benefits and alternatives for the proposed anesthesia with the patient or authorized representative who has indicated his/her understanding and acceptance.       Plan Discussed with: CRNA, Anesthesiologist and Surgeon  Anesthesia Plan Comments:         Anesthesia Quick Evaluation

## 2020-02-10 NOTE — H&P (View-Only) (Signed)
                                                                           [facility] Consult: 8:25 AM 02/10/2020  LOS: 0 days    Referring Provider: Sara Neal MD  Primary Care Physician:  Frank, Peter, MD Primary Gastroenterologist:  unassigned     Reason for Consultation:  Pancreatic pseudocysts   HPI: Alexis Avila is a 39 y.o. female. Relocated from [place] to [place] within last 8 months.  Cholecystectomy 2013 not associated with pancreatitis.  alcoholic pancreatitis ~ 2018, she was hospitalized for 2 days and suffered no sequela, did not undergo any endoscopic diagnostic testing.  S/p bariatric gastric bypass 2002. Obesity. GERD.  Anemia, B12 deficiency.  Ovarian cysts.  Chronic back pain.     Ros patient has no chronic or recurrent problems.  Beginning Friday she lost her appetite and began spitting up, dry heaving foamy/nonbloody material.  This persisted and with every attempt at p.o. she would vomit or spit up.  Then developed pain in the epigastric area which at times radiates into the lower abdomen.  It is not intense but is associated with bloating. Contemplated visit to ED 7/25, too long of a wait so went/seen at UC w vomiting, brown at times, migrating abd pain.  + cough.  Rx Nitrofurantoin and promethazine for UTI.  But sxs persisted and returned to ED last night + Fatigue, weakness, shortness of breath, no angina.  No fever.  Soft BPs 1teens/70s to 90.  HR to low 100s.  sats >95%.   T bili 1.2.  Alk phos 1006.  Transaminases 64/61.  Lipase 113.  Renal fx wnl.  K 3.1 >> 3.6.  Na 134 >> 137WBCs 10.5. Hgb 10.3, was 10.6 in Jan 2021.    CTAP w contrast:  Fluid filled stomach.  S/p cholecystectomy.  Liver unremarkable.   Multiple pseudocysts in TOP, largest 10 cm which may be cause of obstruction and dilated duodenum at lig of Treitz. No acute pancreatitis. Post-op changes at GE  jx, and SB anastomosis.  Mild dilation of some loops of jejunum, more distal SB normal. Uterine Leiomyoma. Healing or non-united fx at inf R pubic rami.  SI joint osteoarthritis bil.   CXR:BB atx.     At maximum she weighed 330 lbs prior to her gastric bypass.  She lost down to 230 lbs but felt uncomfortable at that weight and has since gained.  Wt 296 #/134.3 kg in 07/2019.  125.2 kg now.    Social Pt has says that she drinks a few times a month 1-3 drinks at the most.  Last drink two or more weeks ago. Smokes 2 to 3 cigarettes a day.  She works from home for [employer].  Family history Mother also has undergone bariatric gastric bypass.  There is no family history of pancreatic, liver, gallbladder, gastric diseases or colorectal cancers.    Past Medical History:  Diagnosis Date  . Allergy     Past Surgical History:  Procedure Laterality Date  . CHOLECYSTECTOMY  2013   crystalized gallbaldder  . GASTRIC BYPASS  2002    Prior to Admission medications   Medication Sig Start Date End   Date Taking? Authorizing Provider  nitrofurantoin (MACRODANTIN) 100 MG capsule Take 100 mg by mouth 2 (two) times daily.   Yes [provider]  promethazine (PHENERGAN) 12.5 MG tablet Take 12.5 mg by mouth every 6 (six) hours as needed for nausea or vomiting.   Yes [provider]    Scheduled Meds: . sodium chloride flush  3 mL Intravenous Once   Infusions: . sodium chloride 165 mL/hr at 02/10/20 0130   PRN Meds: morphine injection, ondansetron (ZOFRAN) IV   Allergies as of 02/09/2020 - Review Complete 02/09/2020  Allergen Reaction Noted  . Penicillins Hives 07/20/2019    Family History  Problem Relation Age of Onset  . COPD Sister   . Heart disease Sister     Social History   Socioeconomic History  . Marital status: Single    Spouse name: Not on file  . Number of children: Not on file  . Years of education: Not on file  . Highest education level:  Not on file  Occupational History  . Not on file  Tobacco Use  . Smoking status: Current Every Day Smoker    Packs/day: 0.50    Years: 5.00    Pack years: 2.50    Types: Cigarettes  . Smokeless tobacco: Never Used  Substance and Sexual Activity  . Alcohol use: Yes    Alcohol/week: 13.0 standard drinks    Types: 5 Glasses of wine, 4 Cans of beer, 4 Shots of liquor per week  . Drug use: Not on file  . Sexual activity: Yes    Birth control/protection: None  Other Topics Concern  . Not on file  Social History Narrative  . Not on file   Social Determinants of Health   Financial Resource Strain:   . Difficulty of Paying Living Expenses:   Food Insecurity:   . Worried About Running Out of Food in the Last Year:   . Ran Out of Food in the Last Year:   Transportation Needs:   . Lack of Transportation (Medical):   . Lack of Transportation (Non-Medical):   Physical Activity:   . Days of Exercise per Week:   . Minutes of Exercise per Session:   Stress:   . Feeling of Stress :   Social Connections:   . Frequency of Communication with Friends and Family:   . Frequency of Social Gatherings with Friends and Family:   . Attends Religious Services:   . Active Member of Clubs or Organizations:   . Attends Club or Organization Meetings:   . Marital Status:   Intimate Partner Violence:   . Fear of Current or Ex-Partner:   . Emotionally Abused:   . Physically Abused:   . Sexually Abused:     REVIEW OF SYSTEMS: Constitutional: See HPI. ENT:  No nose bleeds Pulm: Recent shortness of breath, not present today. CV:  No palpitations, no LE edema.  Chest pain, not angina. GU:  No hematuria, no frequency GI: See HPI. Heme:  Low b12 level in 07/2019, RX with weekly b12 x 4.    Transfusions: None Neuro:  No headaches, no peripheral tingling or numbness.  No syncope, no seizures.  Slight dizziness, not present today. Derm:  No itching, no rash or sores.  Endocrine:  No sweats or chills.   No polyuria or dysuria Immunization: Has not received COVID-19 vaccination Travel:  None beyond local counties in last few months.    PHYSICAL EXAM: Vital signs in last 24 hours: Vitals:     02/10/20 0425 02/10/20 0810  BP: (!) 118/90 119/72  Pulse: 96 99  Resp: 17 18  Temp: 98.5 F (36.9 C) 98 F (36.7 C)  SpO2: 98% 97%   Wt Readings from Last 3 Encounters:  02/10/20 (!) 125.2 kg  02/09/20 (!) 124.6 kg  07/20/19 134.3 kg    General: Pleasant, comfortable, obese.  Good historian. Head: No facial asymmetry or swelling.  No signs of head trauma. Eyes: No scleral icterus.  No conjunctival pallor. Ears: Not hard of hearing Nose: No congestion or discharge Mouth: Oral mucosa moist, pink, clear.  Good dental health with some fillings.  Tongue midline. Neck: No JVD, no masses, no thyromegaly. Lungs: Diminished at bases but clear bilaterally.  No labored breathing, no cough. Heart: RRR.  No MRG.  S1, S2 present. Abdomen: Obese, soft.  Tender in the epigastrium and bilateral upper quadrants.  No guarding or rebound.  Bowel sounds hypoactive no tinkling or tympanic bowel sounds.  Soft..   Rectal: Deferred. Musc/Skeltl: No joint redness, swelling or gross deformities. Extremities: No CCE. Neurologic: Alert.  Oriented x3.  Moves all four limbs, no weakness or deficits.  No tremors. Skin: No rashes, no sores, no deficits. Tattoos: Yes Nodes: No cervical adenopathy Psych: Pleasant, cooperative, calm.  Fluid speech.  Affect normal, not depressed, not anxious or agitated.  Intake/Output from previous day: No intake/output data recorded. Intake/Output this shift: No intake/output data recorded.  LAB RESULTS: Recent Labs    02/09/20 1226 02/10/20 0500  WBC 10.5 9.6  HGB 11.1* 10.3*  HCT 37.7 35.1*  PLT 396 384   BMET Lab Results  Component Value Date   NA 137 02/10/2020   NA 134 (L) 02/09/2020   K 3.6 02/10/2020   K 3.1 (L) 02/09/2020   CL 108 02/10/2020   CL 104  02/09/2020   CO2 22 02/10/2020   CO2 20 (L) 02/09/2020   GLUCOSE 141 (H) 02/10/2020   GLUCOSE 144 (H) 02/09/2020   BUN 9 02/10/2020   BUN 9 02/09/2020   CREATININE 0.68 02/10/2020   CREATININE 0.62 02/09/2020   CALCIUM 10.4 (H) 02/10/2020   CALCIUM 10.8 (H) 02/09/2020   LFT Recent Labs    02/09/20 1226 02/10/20 0500  PROT 7.7 7.0  ALBUMIN 3.4* 3.0*  AST 29 64*  ALT 55* 61*  ALKPHOS 1,066* 1,006*  BILITOT 1.0 1.2   PT/INR No results found for: INR, PROTIME Hepatitis Panel No results for input(s): HEPBSAG, HCVAB, HEPAIGM, HEPBIGM in the last 72 hours. C-Diff No components found for: CDIFF Lipase     Component Value Date/Time   LIPASE 113 (H) 02/09/2020 1226    Drugs of Abuse  No results found for: LABOPIA, COCAINSCRNUR, LABBENZ, AMPHETMU, THCU, LABBARB   RADIOLOGY STUDIES: CT Abdomen Pelvis W Contrast  Result Date: 02/09/2020 CLINICAL DATA:  History of pancreatitis. Abdominal pain. Nausea and vomiting. EXAM: CT ABDOMEN AND PELVIS WITH CONTRAST TECHNIQUE: Multidetector CT imaging of the abdomen and pelvis was performed using the standard protocol following bolus administration of intravenous contrast. CONTRAST:  100mL OMNIPAQUE IOHEXOL 300 MG/ML  SOLN COMPARISON:  None. FINDINGS: Lower chest: Elevation of the left hemidiaphragm. Atelectasis at the lung bases left worse than right. Hepatobiliary: Liver parenchyma appears normal. Previous cholecystectomy. Pancreas: Numerous cysts in the region of the pancreatic tail consistent with pseudocysts. The largest measures up to 10 cm in size. Numerous other smaller cysts. I do not see evidence of active pancreatitis such as swelling or retroperitoneal edema. Spleen: Normal Adrenals/Urinary Tract: No   primary adrenal pathology is seen. 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 Small nonobstructing renal calculi on both sides. No renal parenchymal lesion. No hydronephrosis. Bladder is normal. Stomach/Bowel: Previous surgery at the  gastroesophageal junction. Fluid-filled stomach. Dilated duodenum to the ligament of Treitz region, where the largest pseudo cyst could be resulting in obstruction. Distal to that, there are a few dilated loops of jejunum, though not as pronounced. There is been previous small bowel anastomotic site surgery in that region. More distal small bowel is entirely normal. No evidence of colon obstruction or pathology. Vascular/Lymphatic: Aorta and IVC are normal. No retroperitoneal adenopathy. Reproductive: Uterus shows a leiomyoma projecting dorsally from the fundus measuring 3.3 cm. No ovarian lesion. Other: No free fluid or air. Musculoskeletal: Healing or nonunited fracture at the inferior pubic ramus on the right. No spinal pathology is seen. Osteoarthritis of the sacroiliac joints is noted. Endplate Schmorl's nodes are seen within the lumbar spine. IMPRESSION: Numerous chronic or subacute appearing pseudo cysts in the region of the pancreatic tail. The largest measures 10 cm in diameter. This is in the region of the ligament of Treitz and appears to be exerting some mass effect upon the small bowel at that level, possibly with relative small bowel obstruction. Numerous other small pseudocysts in the region of the pancreatic tail and retroperitoneum. Previous surgery at the gastroesophageal junction region. Previous small bowel anastomotic surgery in the jejunal region. Distal small bowel appears normal. No suspicion of active/acute pancreatitis. Elevation of left hemidiaphragm. Basilar atelectasis left more than right. Electronically Signed   By: Mark  Shogry M.D.   On: 02/09/2020 20:36   DG Chest Port 1 View  Result Date: 02/09/2020 CLINICAL DATA:  Shortness of breath for several days EXAM: PORTABLE CHEST 1 VIEW COMPARISON:  None. FINDINGS: Cardiac shadow is mildly prominent but accentuated by the portable technique. Minimal left basilar atelectasis is noted. The left hemidiaphragm is elevated. No bony  abnormality is seen. IMPRESSION: Mild left basilar atelectasis. Electronically Signed   By: Mark  Lukens M.D.   On: 02/09/2020 19:34     IMPRESSION:   *  Abd pain, n/v.  Elevated alk phos, lesser elevation transaminases and Lipase.   Pancreatic pseudocysts per CT, one of which appears to be causing partial duodenal obsruction.  Hx ETOH pancreatitis w/o complications 2018.  Still consumes alcohol on a moderate level  *   S/p gastric bypass 2002 w succesful wt loss but pt remains obese, wt down 9 kg from Jan.    *   Hypokalemia, resolved.    *   Normocytic anemia.       PLAN:     *  Supportive care w IVF, pain and nausea mgt Agree w surgical PA that pt ok for ice chips.  Do not see need for NGT at present.   If vomiting worsens or pain becomes unmanageable, consider placement NG tube to low intermittent suction.  *   Surgical PA has seen the patient, no indication for surgical intervention at present.   Maquita Sandoval  02/10/2020, 8:25 AM Phone [phone]     

## 2020-02-10 NOTE — Consult Note (Signed)
[facility] Surgery Consult Note  Alexis Avila 1981-05-12  [id number].    Requesting MD: Nori Riis Chief Complaint/Reason for Consult: PSBO, pancreatitis  HPI:  Patient is a 39 year old female who presented to [facility] with abdominal pain and nausea for 5 days. She reports pain is located in epigastrium and is constant, has gradually worsened. She has a hx of pancreatitis back in 2018 but states this feels worse than back then. She has vomited and emesis was NBNB. She is nauseated now but medication is helping, spitting up small amounts but not vomiting large volumes. 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 Last alcoholic beverage was 2 weeks ago. Denies fever, chills, constipation, diarrhea, urinary symptoms. She does have some chest tightness and SOB, but feels it may be more related to abdominal pain. PMH otherwise significant for GERD. Allergic to PCNs. PSH includes gastric bypass which was done in [place] when she was around 39 years old. Last BM was yesterday. Denies sick contacts. Has not been vaccinated for COVID.   ROS: Review of Systems  Constitutional: Negative for chills and fever.  Respiratory: Positive for shortness of breath.   Cardiovascular: Negative for chest pain and palpitations.  Gastrointestinal: Positive for abdominal pain, nausea and vomiting. Negative for blood in stool, constipation, diarrhea and melena.  Genitourinary: Negative for dysuria, frequency and urgency.  All other systems reviewed and are negative.   Family History  Problem Relation Age of Onset  . COPD Sister   . Heart disease Sister     Past Medical History:  Diagnosis Date  . Allergy     Past Surgical History:  Procedure Laterality Date  . CHOLECYSTECTOMY  2013   crystalized gallbaldder  . GASTRIC BYPASS  2002    Social History:  reports that she has been smoking cigarettes. She has a 2.50 pack-year smoking history. She  has never used smokeless tobacco. She reports current alcohol use of about 13.0 standard drinks of alcohol per week. No history on file for drug use.  Allergies:  Allergies  Allergen Reactions  . Penicillins Hives    Facility-Administered Medications Prior to Admission  Medication Dose Route Frequency Provider Last Rate Last Admin  . [DISCONTINUED] ondansetron (ZOFRAN) injection 8 mg  8 mg Intramuscular Once Doristine Mango L, DO       Medications Prior to Admission  Medication Sig Dispense Refill  . nitrofurantoin (MACRODANTIN) 100 MG capsule Take 100 mg by mouth 2 (two) times daily.    . promethazine (PHENERGAN) 12.5 MG tablet Take 12.5 mg by mouth every 6 (six) hours as needed for nausea or vomiting.      Blood pressure 119/72, pulse 99, temperature 98 F (36.7 C), temperature source Oral, resp. rate 18, height 5\' 10"  (1.778 m), weight (!) 125.2 kg, last menstrual period 02/03/2020, SpO2 97 %. Physical Exam:  General: pleasant, WD, obese female who is laying in bed in NAD HEENT: Sclera are anicteric.  PERRL.  Ears and nose without any masses or lesions.  Mouth is pink and moist Heart: regular, rate, and rhythm.  Normal s1,s2. No obvious murmurs, gallops, or rubs noted.  Palpable radial and pedal pulses bilaterally Lungs: CTAB, no wheezes, rhonchi, or rales noted.  Respiratory effort nonlabored Abd: soft, TTP in epigastrium and LUQ, ND, +BS, no masses, hernias, or organomegaly MS: all 4 extremities are symmetrical with no cyanosis, clubbing, or edema. Skin: warm and dry with no masses, lesions, or rashes  Neuro: Cranial nerves 2-12 grossly intact, sensation grossly intact Psych: A&Ox3 with an appropriate affect.   Results for orders placed or performed during the hospital encounter of 02/09/20 (from the past 48 hour(s))  Lipase, blood     Status: Abnormal   Collection Time: 02/09/20 12:26 PM  Result Value Ref Range   Lipase 113 (H) 11 - 51 U/L    Comment: Performed at [facility], [place] [address], [place], [place] [address]  Comprehensive metabolic panel     Status: Abnormal   Collection Time: 02/09/20 12:26 PM  Result Value Ref Range   Sodium 134 (L) 135 - 145 mmol/L   Potassium 3.1 (L) 3.5 - 5.1 mmol/L   Chloride 104 98 - 111 mmol/L   CO2 20 (L) 22 - 32 mmol/L   Glucose, Bld 144 (H) 70 - 99 mg/dL    Comment: Glucose reference range applies only to samples taken after fasting for at least 8 hours.   BUN 9 6 - 20 mg/dL   Creatinine, Ser 0.62 0.44 - 1.00 mg/dL   Calcium 10.8 (H) 8.9 - 10.3 mg/dL   Total Protein 7.7 6.5 - 8.1 g/dL   Albumin 3.4 (L) 3.5 - 5.0 g/dL   AST 29 15 - 41 U/L   ALT 55 (H) 0 - 44 U/L   Alkaline Phosphatase 1,066 (H) 38 - 126 U/L   Total Bilirubin 1.0 0.3 - 1.2 mg/dL   GFR calc non Af Amer >60 >60 mL/min   GFR calc Af Amer >60 >60 mL/min   Anion gap 10 5 - 15    Comment: Performed at [facility], [place] [address], [place], [place] [address]  CBC     Status: Abnormal   Collection Time: 02/09/20 12:26 PM  Result Value Ref Range   WBC 10.5 4.0 - 10.5 K/uL   RBC 4.32 3.87 - 5.11 MIL/uL   Hemoglobin 11.1 (L) 12.0 - 15.0 g/dL   HCT 37.7 36 - 46 %   MCV 87.3 80.0 - 100.0 fL   MCH 25.7 (L) 26.0 - 34.0 pg   MCHC 29.4 (L) 30.0 - 36.0 g/dL   RDW 19.4 (H) 11.5 - 15.5 %   Platelets 396 150 - 400 K/uL   nRBC 0.0 0.0 - 0.2 %    Comment: Performed at [facility] [address], [place], [place] [address]  I-Stat beta hCG blood, ED     Status: None   Collection Time: 02/09/20 12:42 PM  Result Value Ref Range   I-stat hCG, quantitative <5.0 <5 mIU/mL   Comment 3            Comment:   GEST. AGE      CONC.  (mIU/mL)   <=1 WEEK        5 - 50     2 WEEKS       50 - 500     3 WEEKS       100 - 10,000     4 WEEKS     1,000 - 30,000        FEMALE AND NON-PREGNANT FEMALE:     LESS THAN 5 mIU/mL   Troponin I (High Sensitivity)     Status: None   Collection Time: 02/09/20  8:38 PM  Result Value Ref Range   Troponin I (High  Sensitivity) 3 <18 ng/L    Comment: (NOTE) Elevated high sensitivity troponin I (hsTnI) values and significant  changes  across serial measurements may suggest ACS but many other  chronic and acute conditions are known to elevate hsTnI results.  Refer to the "Links" section for chest pain algorithms and additional  guidance. Performed at [facility], [place] [address] [place], [place] [address]   SARS Coronavirus 2 by RT PCR (hospital order, performed in [facility] hospital lab) Nasopharyngeal Nasopharyngeal Swab     Status: None   Collection Time: 02/09/20  8:39 PM   Specimen: Nasopharyngeal Swab  Result Value Ref Range   SARS Coronavirus 2 NEGATIVE NEGATIVE    Comment: (NOTE) SARS-CoV-2 target nucleic acids are NOT DETECTED.  The SARS-CoV-2 RNA is generally detectable in upper and lower respiratory specimens during the acute phase of infection. The lowest concentration of SARS-CoV-2 viral copies this assay can detect is 250 copies / mL. A negative result does not preclude SARS-CoV-2 infection and should not be used as the sole basis for treatment or other patient management decisions.  A negative result may occur with improper specimen collection / handling, submission of specimen other than nasopharyngeal swab, presence of viral mutation(s) within the areas targeted by this assay, and inadequate number of viral copies (<250 copies / mL). A negative result must be combined with clinical observations, patient history, and epidemiological information.  Fact Sheet for Patients:   StrictlyIdeas.no  Fact Sheet for Healthcare Providers: BankingDealers.co.za  This test is not yet approved or  cleared by the [place] FDA and has been authorized for detection and/or diagnosis of SARS-CoV-2 by FDA under an Emergency Use Authorization (EUA).  This EUA will remain in effect (meaning this test can be used) for the duration of  the COVID-19 declaration under Section 564(b)(1) of the Act, 21 U.S.C. section 360bbb-3(b)(1), unless the authorization is terminated or revoked sooner.  Performed at [facility], [place] [address], [place], [place] [address]   Troponin I (High Sensitivity)     Status: None   Collection Time: 02/10/20  5:00 AM  Result Value Ref Range   Troponin I (High Sensitivity) 4 <18 ng/L    Comment: (NOTE) Elevated high sensitivity troponin I (hsTnI) values and significant  changes across serial measurements may suggest ACS but many other  chronic and acute conditions are known to elevate hsTnI results.  Refer to the "Links" section for chest pain algorithms and additional  guidance. Performed at [facility], [place] [address], [place], [place] [address]   Comprehensive metabolic panel     Status: Abnormal   Collection Time: 02/10/20  5:00 AM  Result Value Ref Range   Sodium 137 135 - 145 mmol/L   Potassium 3.6 3.5 - 5.1 mmol/L   Chloride 108 98 - 111 mmol/L   CO2 22 22 - 32 mmol/L   Glucose, Bld 141 (H) 70 - 99 mg/dL    Comment: Glucose reference range applies only to samples taken after fasting for at least 8 hours.   BUN 9 6 - 20 mg/dL   Creatinine, Ser 0.68 0.44 - 1.00 mg/dL   Calcium 10.4 (H) 8.9 - 10.3 mg/dL   Total Protein 7.0 6.5 - 8.1 g/dL   Albumin 3.0 (L) 3.5 - 5.0 g/dL   AST 64 (H) 15 - 41 U/L   ALT 61 (H) 0 - 44 U/L   Alkaline Phosphatase 1,006 (H) 38 - 126 U/L   Total Bilirubin 1.2 0.3 - 1.2 mg/dL   GFR calc non Af Amer >60 >60 mL/min   GFR calc  Af Amer >60 >60 mL/min   Anion gap 7 5 - 15    Comment: Performed at [facility] [address], [place], [place] [address]  CBC     Status: Abnormal   Collection Time: 02/10/20  5:00 AM  Result Value Ref Range   WBC 9.6 4.0 - 10.5 K/uL   RBC 4.05 3.87 - 5.11 MIL/uL   Hemoglobin 10.3 (L) 12.0 - 15.0 g/dL   HCT 35.1 (L) 36 - 46 %   MCV 86.7 80.0 - 100.0 fL   MCH 25.4 (L) 26.0 - 34.0 pg   MCHC 29.3 (L) 30.0  - 36.0 g/dL   RDW 19.5 (H) 11.5 - 15.5 %   Platelets 384 150 - 400 K/uL   nRBC 0.0 0.0 - 0.2 %    Comment: Performed at [facility], [place] [address] [place], [place] [address]  Vitamin B12     Status: None   Collection Time: 02/10/20  5:00 AM  Result Value Ref Range   Vitamin B-12 261 180 - 914 pg/mL    Comment: (NOTE) This assay is not validated for testing neonatal or myeloproliferative syndrome specimens for Vitamin B12 levels. Performed at [facility], [place] [address] [place], [place] [address]   Folate, serum, performed at [facility] lab     Status: None   Collection Time: 02/10/20  5:00 AM  Result Value Ref Range   Folate 8.9 >5.9 ng/mL    Comment: Performed at [facility], [place]  [address] [place], [place] [address]  Gamma GT     Status: Abnormal   Collection Time: 02/10/20  5:00 AM  Result Value Ref Range   GGT 116 (H) 7 - 50 U/L    Comment: Performed at [facility], [place] [address] [place], [place] [address]  Hemoglobin A1c     Status: Abnormal   Collection Time: 02/10/20  5:00 AM  Result Value Ref Range   Hgb A1c MFr Bld 4.4 (L) 4.8 - 5.6 %    Comment: (NOTE) Pre diabetes:          5.7%-6.4%  Diabetes:              >6.4%  Glycemic control for   <7.0% adults with diabetes    Mean Plasma Glucose 79.58 mg/dL    Comment: Performed at [facility] [address], [place], [place] [address]  Magnesium     Status: None   Collection Time: 02/10/20  5:00 AM  Result Value Ref Range   Magnesium 1.9 1.7 - 2.4 mg/dL    Comment: Performed at [facility], [place] [address] [place], [place] [address]  Urinalysis, Routine w reflex microscopic     Status: Abnormal   Collection Time: 02/10/20  7:39 AM  Result Value Ref Range   Color, Urine ORANGE (A) YELLOW    Comment: BIOCHEMICALS MAY BE AFFECTED BY COLOR   APPearance CLOUDY (A) CLEAR   Specific Gravity, Urine 1.010 1.005 - 1.030   pH 6.5 5.0 - 8.0   Glucose, UA NEGATIVE NEGATIVE  mg/dL   Hgb urine dipstick SMALL (A) NEGATIVE   Bilirubin Urine MODERATE (A) NEGATIVE   Ketones, ur NEGATIVE NEGATIVE mg/dL   Protein, ur 30 (A) NEGATIVE mg/dL   Nitrite POSITIVE (A) NEGATIVE   Leukocytes,Ua NEGATIVE NEGATIVE    Comment: Performed at [facility], [place] [address], [place], [place] [address]  Urinalysis, Microscopic (reflex)  Status: Abnormal   Collection Time: 02/10/20  7:39 AM  Result Value Ref Range   RBC / HPF 0-5 0 - 5 RBC/hpf   WBC, UA 0-5 0 - 5 WBC/hpf   Bacteria, UA MANY (A) NONE SEEN   Squamous Epithelial / LPF 0-5 0 - 5   Hyaline Casts, UA PRESENT     Comment: Performed at [facility], [place] [address], [place], [place] [address]   CT Abdomen Pelvis W Contrast  Result Date: 02/09/2020 CLINICAL DATA:  History of pancreatitis. Abdominal pain. Nausea and vomiting. EXAM: CT ABDOMEN AND PELVIS WITH CONTRAST TECHNIQUE: Multidetector CT imaging of the abdomen and pelvis was performed using the standard protocol following bolus administration of intravenous contrast. CONTRAST:  165mL OMNIPAQUE IOHEXOL 300 MG/ML  SOLN COMPARISON:  None. FINDINGS: Lower chest: Elevation of the left hemidiaphragm. Atelectasis at the lung bases left worse than right. Hepatobiliary: Liver parenchyma appears normal. Previous cholecystectomy. Pancreas: Numerous cysts in the region of the pancreatic tail consistent with pseudocysts. The largest measures up to 10 cm in size. Numerous other smaller cysts. I do not see evidence of active pancreatitis such as swelling or retroperitoneal edema. Spleen: Normal Adrenals/Urinary Tract: No primary adrenal pathology is seen. 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 Small nonobstructing renal calculi on both sides. No renal parenchymal lesion. No hydronephrosis. Bladder is normal. Stomach/Bowel: Previous surgery at the gastroesophageal junction. Fluid-filled stomach. 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 Distal to that, there are a few dilated loops of jejunum, though not as pronounced. There is been previous small bowel anastomotic site surgery in that region. More distal small bowel is entirely normal. No evidence of colon obstruction or pathology. Vascular/Lymphatic: Aorta and IVC are normal. No retroperitoneal adenopathy. Reproductive: Uterus shows a leiomyoma projecting dorsally from the fundus measuring 3.3 cm. No ovarian lesion. Other: No free fluid or air. Musculoskeletal: Healing or nonunited fracture at the inferior pubic ramus on the right. No spinal pathology is seen. Osteoarthritis of the sacroiliac joints is noted. Endplate Schmorl's nodes are seen within the lumbar spine. IMPRESSION: Numerous chronic or subacute appearing pseudo cysts in the region of the pancreatic tail. The largest measures 10 cm in diameter. This is in the region of the ligament of Treitz and appears to be exerting some mass effect upon the small bowel at that level, possibly with relative small bowel obstruction. Numerous other small pseudocysts in the region of the pancreatic tail and retroperitoneum. Previous surgery at the gastroesophageal junction region. Previous small bowel anastomotic surgery in the jejunal region. Distal small bowel appears normal. No suspicion of active/acute pancreatitis. Elevation of left hemidiaphragm. Basilar atelectasis left more than right. Electronically Signed   By: Nelson Chimes M.D.   On: 02/09/2020 20:36   DG Chest Port 1 View  Result Date: 02/09/2020 CLINICAL DATA:  Shortness of breath for several days EXAM: PORTABLE CHEST 1 VIEW COMPARISON:  None. FINDINGS: Cardiac shadow is mildly prominent but accentuated by the portable technique. Minimal left basilar atelectasis is noted. The left hemidiaphragm is elevated. No bony abnormality is seen. IMPRESSION: Mild left basilar atelectasis. Electronically Signed   By: Inez Catalina M.D.   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 Possible PSBO - GI to consult for management of pancreatitis with pseudocysts - I do not think patient requires NGT at  this time but if she starts vomiting more, would recommend placement - Possible that pseudocyst is compressing duodenum vs reactive ileus from pancreatitis - I think ileus is less likely however given that distal small bowel is all decompressed - Not sure if GI could offer any sort of decompressive intervention for pseudocyst, if unable and patient remains unable to tolerate PO intake may need to consider PICC and TPN  - from a surgery standpoint patient could have ice chips, but will defer to GI on diet management given pancreatitis  - I do not see an indication for acute surgical intervention at this time  FEN: NPO, IVF VTE: SCDs, ok to have chemical prophylaxis from a surgery standpoint ID: no current abx  Norm Parcel, [facility] Surgery 02/10/2020, 8:52 AM Please see Amion for pager number during day hours 7:00am-4:30pm

## 2020-02-11 ENCOUNTER — Encounter (HOSPITAL_COMMUNITY): Payer: Self-pay | Admitting: Gastroenterology

## 2020-02-11 ENCOUNTER — Other Ambulatory Visit: Payer: Self-pay | Admitting: Physician Assistant

## 2020-02-11 DIAGNOSIS — K56600 Partial intestinal obstruction, unspecified as to cause: Secondary | ICD-10-CM

## 2020-02-11 DIAGNOSIS — Z9884 Bariatric surgery status: Secondary | ICD-10-CM

## 2020-02-11 DIAGNOSIS — K289 Gastrojejunal ulcer, unspecified as acute or chronic, without hemorrhage or perforation: Secondary | ICD-10-CM

## 2020-02-11 DIAGNOSIS — K863 Pseudocyst of pancreas: Secondary | ICD-10-CM | POA: Diagnosis not present

## 2020-02-11 DIAGNOSIS — R7989 Other specified abnormal findings of blood chemistry: Secondary | ICD-10-CM

## 2020-02-11 LAB — CBC
HCT: 32.7 % — ABNORMAL LOW (ref 36.0–46.0)
Hemoglobin: 9.7 g/dL — ABNORMAL LOW (ref 12.0–15.0)
MCH: 25.7 pg — ABNORMAL LOW (ref 26.0–34.0)
MCHC: 29.7 g/dL — ABNORMAL LOW (ref 30.0–36.0)
MCV: 86.7 fL (ref 80.0–100.0)
Platelets: 389 10*3/uL (ref 150–400)
RBC: 3.77 MIL/uL — ABNORMAL LOW (ref 3.87–5.11)
RDW: 19.8 % — ABNORMAL HIGH (ref 11.5–15.5)
WBC: 10.1 10*3/uL (ref 4.0–10.5)
nRBC: 0 % (ref 0.0–0.2)

## 2020-02-11 LAB — COMPREHENSIVE METABOLIC PANEL
ALT: 71 U/L — ABNORMAL HIGH (ref 0–44)
AST: 118 U/L — ABNORMAL HIGH (ref 15–41)
Albumin: 2.7 g/dL — ABNORMAL LOW (ref 3.5–5.0)
Alkaline Phosphatase: 920 U/L — ABNORMAL HIGH (ref 38–126)
Anion gap: 10 (ref 5–15)
BUN: 6 mg/dL (ref 6–20)
CO2: 20 mmol/L — ABNORMAL LOW (ref 22–32)
Calcium: 9.9 mg/dL (ref 8.9–10.3)
Chloride: 109 mmol/L (ref 98–111)
Creatinine, Ser: 0.58 mg/dL (ref 0.44–1.00)
GFR calc Af Amer: >60 mL/min (ref >60)
GFR calc non Af Amer: >60 mL/min (ref >60)
Glucose, Bld: 120 mg/dL — ABNORMAL HIGH (ref 70–99)
Potassium: 3.4 mmol/L — ABNORMAL LOW (ref 3.5–5.1)
Sodium: 139 mmol/L (ref 135–145)
Total Bilirubin: 1.9 mg/dL — ABNORMAL HIGH (ref 0.3–1.2)
Total Protein: 6.5 g/dL (ref 6.5–8.1)

## 2020-02-11 LAB — HIV ANTIBODY (ROUTINE TESTING W REFLEX): HIV Screen 4th Generation wRfx: NONREACTIVE

## 2020-02-11 LAB — SURGICAL PATHOLOGY

## 2020-02-11 MED ORDER — POTASSIUM CHLORIDE 10 MEQ/100ML IV SOLN: 10.0000 meq | INTRAVENOUS | Status: DC

## 2020-02-11 MED ORDER — POLYETHYLENE GLYCOL 3350 17 G PO PACK
17.0000 g | PACK | Freq: Every day | ORAL | Status: DC
  Administered 2020-02-11 – 2020-02-13 (×3): 17 g via ORAL
  Filled 2020-02-11 (×3): qty 1

## 2020-02-11 MED ORDER — PROMETHAZINE HCL 25 MG/ML IJ SOLN
12.5000 mg | Freq: Four times a day (QID) | INTRAMUSCULAR | Status: DC | PRN
  Administered 2020-02-11 – 2020-02-14 (×4): 12.5 mg via INTRAVENOUS
  Filled 2020-02-11 (×4): qty 1

## 2020-02-11 MED ORDER — MORPHINE SULFATE (PF) 2 MG/ML IV SOLN: 1.0000 mg | INTRAVENOUS | Status: DC | PRN

## 2020-02-11 MED ORDER — TRAMADOL HCL 50 MG PO TABS: 50.0000 mg | ORAL_TABLET | Freq: Four times a day (QID) | ORAL | Status: DC | PRN

## 2020-02-11 MED ORDER — POTASSIUM CHLORIDE CRYS ER 20 MEQ PO TBCR
30.0000 meq | EXTENDED_RELEASE_TABLET | Freq: Two times a day (BID) | ORAL | Status: AC
  Administered 2020-02-11 (×2): 30 meq via ORAL
  Filled 2020-02-11 (×2): qty 1

## 2020-02-11 MED ORDER — HYDROCODONE-ACETAMINOPHEN 5-325 MG PO TABS
1.0000 | ORAL_TABLET | Freq: Four times a day (QID) | ORAL | Status: DC | PRN
  Administered 2020-02-11: 1 via ORAL
  Filled 2020-02-11: qty 1

## 2020-02-11 MED ORDER — ENOXAPARIN SODIUM 60 MG/0.6ML ~~LOC~~ SOLN
60.0000 mg | SUBCUTANEOUS | Status: DC
  Administered 2020-02-11 – 2020-02-13 (×2): 60 mg via SUBCUTANEOUS
  Filled 2020-02-11 (×3): qty 0.6

## 2020-02-11 MED ORDER — HYDROCODONE-ACETAMINOPHEN 7.5-325 MG PO TABS
1.0000 | ORAL_TABLET | Freq: Four times a day (QID) | ORAL | Status: DC | PRN
  Administered 2020-02-11 – 2020-02-14 (×8): 1 via ORAL
  Filled 2020-02-11 (×8): qty 1

## 2020-02-11 MED ORDER — VANCOMYCIN HCL IN DEXTROSE 1-5 GM/200ML-% IV SOLN
1000.0000 mg | INTRAVENOUS | Status: AC
  Administered 2020-02-12: 1000 mg via INTRAVENOUS
  Filled 2020-02-11: qty 200

## 2020-02-11 MED ORDER — TRAMADOL HCL 50 MG PO TABS
50.0000 mg | ORAL_TABLET | Freq: Once | ORAL | Status: AC
  Administered 2020-02-11: 50 mg via ORAL
  Filled 2020-02-11: qty 1

## 2020-02-11 NOTE — Consult Note (Signed)
Chief Complaint: Pancreatic pseudo cyst. Request is for pancreatic pseudo cyst drain placement  Referring Physician(s): Sandi Carne PA  Supervising Physician: Sandi Mariscal  Patient Status: [facility] - In-pt  History of Present Illness: Alexis Avila is a 39 y.o. 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 presented to this facility with abdominal pain nausea and vomiting X 5 days. 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 CT abd pelvis from 7.28.21 reads Numerous chronic or subacute appearing pseudo cysts in the region of the pancreatic tail. The largest measures 10 cm in diameter. This is in the region of the ligament of Treitz and appears to be exerting some mass effect upon the small bowel at that level, possibly with relative small bowel obstruction. Numerous other small pseudocysts in the region of the pancreatic tail and retroperitoneum Team os concerned that pseudo cyst is causing pressure on the afferent limb. Pathology from EGD performed on 7.30.21 shows one large marginal  Ulcer and a smaller marginal ulcer. requesting drain placement to the pancreatic pseudo cyst.   Past Medical History:  Diagnosis Date  . Allergy     Past Surgical History:  Procedure Laterality Date  . BIOPSY  02/10/2020   Procedure: BIOPSY;  Surgeon: Lavena Bullion, DO;  Location: [facility];  Service: Gastroenterology;;  . CHOLECYSTECTOMY  2013   crystalized gallbaldder  . ENTEROSCOPY N/A 02/10/2020   Procedure: ENTEROSCOPY;  Surgeon: Lavena Bullion, DO;  Location: [facility];  Service: Gastroenterology;  Laterality: N/A;  . GASTRIC BYPASS  2002    Allergies: Penicillins  Medications: Prior to Admission medications   Medication Sig Start Date End Date Taking? Authorizing Provider  nitrofurantoin (MACRODANTIN) 100 MG capsule Take 100 mg by mouth 2 (two) times daily.   Yes [provider]    promethazine (PHENERGAN) 12.5 MG tablet Take 12.5 mg by mouth every 6 (six) hours as needed for nausea or vomiting.   Yes [provider]     Family History  Problem Relation Age of Onset  . COPD Sister   . Heart disease Sister     Social History   Socioeconomic History  . Marital status: Single    Spouse name: Not on file  . Number of children: Not on file  . Years of education: Not on file  . Highest education level: Not on file  Occupational History  . Not on file  Tobacco Use  . Smoking status: Current Every Day Smoker    Packs/day: 0.50    Years: 5.00    Pack years: 2.50    Types: Cigarettes  . Smokeless tobacco: Never Used  Substance and Sexual Activity  . Alcohol use: Yes    Alcohol/week: 13.0 standard drinks    Types: 5 Glasses of wine, 4 Cans of beer, 4 Shots of liquor per week  . Drug use: Not on file  . Sexual activity: Yes    Birth control/protection: None  Other Topics Concern  . Not on file  Social History Narrative  . Not on file   Social Determinants of Health   Financial Resource Strain:   . Difficulty of Paying Living Expenses:   Food Insecurity:   . Worried About Charity fundraiser in the Last Year:   . Arboriculturist in the Last Year:   Transportation Needs:   . Film/video editor (Medical):   Marland Kitchen Lack of Transportation (  Non-Medical):   Physical Activity:   . Days of Exercise per Week:   . Minutes of Exercise per Session:   Stress:   . Feeling of Stress :   Social Connections:   . Frequency of Communication with Friends and Family:   . Frequency of Social Gatherings with Friends and Family:   . Attends Religious Services:   . Active Member of Clubs or Organizations:   . Attends Archivist Meetings:   Marland Kitchen Marital Status:     Review of Systems: A 12 point ROS discussed and pertinent positives are indicated in the HPI above.  All other systems are negative.  Review of Systems  Constitutional: Negative for fatigue  and fever.  HENT: Negative for congestion.   Respiratory: Negative for cough and shortness of breath.   Gastrointestinal: Positive for abdominal pain (left flank described as gnawing. Better with pain medication but still present) and nausea ( improved with nasuea medication.). Negative for diarrhea and vomiting.    Vital Signs: BP 120/72 (BP Location: Right Arm)   Pulse 96   Temp 98.1 F (36.7 C) (Oral)   Resp 18   Ht 5\' 10"  (1.778 m)   Wt (!) 276 lb (125.2 kg)   LMP 02/03/2020   SpO2 99%   BMI 39.60 kg/m   Physical Exam Vitals and nursing note reviewed.  Constitutional:      Appearance: She is well-developed.  HENT:     Head: Normocephalic and atraumatic.  Eyes:     Conjunctiva/sclera: Conjunctivae normal.  Cardiovascular:     Rate and Rhythm: Normal rate and regular rhythm.  Pulmonary:     Effort: Pulmonary effort is normal.     Breath sounds: Normal breath sounds.  Musculoskeletal:        General: Normal range of motion.     Cervical back: Normal range of motion.  Skin:    General: Skin is warm.  Neurological:     Mental Status: She is alert and oriented to person, place, and time.     Imaging: CT Abdomen Pelvis W Contrast  Result Date: 02/09/2020 CLINICAL DATA:  History of pancreatitis. Abdominal pain. Nausea and vomiting. EXAM: CT ABDOMEN AND PELVIS WITH CONTRAST TECHNIQUE: Multidetector CT imaging of the abdomen and pelvis was performed using the standard protocol following bolus administration of intravenous contrast. CONTRAST:  152mL OMNIPAQUE IOHEXOL 300 MG/ML  SOLN COMPARISON:  None. FINDINGS: Lower chest: Elevation of the left hemidiaphragm. Atelectasis at the lung bases left worse than right. Hepatobiliary: Liver parenchyma appears normal. Previous cholecystectomy. Pancreas: Numerous cysts in the region of the pancreatic tail consistent with pseudocysts. The largest measures up to 10 cm in size. Numerous other smaller cysts. I do not see evidence of active  pancreatitis such as swelling or retroperitoneal edema. Spleen: Normal Adrenals/Urinary Tract: No primary adrenal pathology is seen. 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 Small nonobstructing renal calculi on both sides. No renal parenchymal lesion. No hydronephrosis. Bladder is normal. Stomach/Bowel: Previous surgery at the gastroesophageal junction. Fluid-filled stomach. Dilated duodenum to the ligament of Treitz region, where the largest pseudo cyst could be resulting in obstruction. Distal to that, there are a few dilated loops of jejunum, though not as pronounced. There is been previous small bowel anastomotic site surgery in that region. More distal small bowel is entirely normal. No evidence of colon obstruction or pathology. Vascular/Lymphatic: Aorta and IVC are normal. No retroperitoneal adenopathy. Reproductive: Uterus shows a leiomyoma projecting dorsally from the fundus measuring 3.3  cm. No ovarian lesion. Other: No free fluid or air. Musculoskeletal: Healing or nonunited fracture at the inferior pubic ramus on the right. No spinal pathology is seen. Osteoarthritis of the sacroiliac joints is noted. Endplate Schmorl's nodes are seen within the lumbar spine. IMPRESSION: Numerous chronic or subacute appearing pseudo cysts in the region of the pancreatic tail. The largest measures 10 cm in diameter. This is in the region of the ligament of Treitz and appears to be exerting some mass effect upon the small bowel at that level, possibly with relative small bowel obstruction. Numerous other small pseudocysts in the region of the pancreatic tail and retroperitoneum. Previous surgery at the gastroesophageal junction region. Previous small bowel anastomotic surgery in the jejunal region. Distal small bowel appears normal. No suspicion of active/acute pancreatitis. Elevation of left hemidiaphragm. Basilar atelectasis left more than right. Electronically Signed   By: Nelson Chimes M.D.   On: 02/09/2020 20:36   DG  Chest Port 1 View  Result Date: 02/09/2020 CLINICAL DATA:  Shortness of breath for several days EXAM: PORTABLE CHEST 1 VIEW COMPARISON:  None. FINDINGS: Cardiac shadow is mildly prominent but accentuated by the portable technique. Minimal left basilar atelectasis is noted. The left hemidiaphragm is elevated. No bony abnormality is seen. IMPRESSION: Mild left basilar atelectasis. Electronically Signed   By: Inez Catalina M.D.   On: 02/09/2020 19:34    Labs:  CBC: Recent Labs    07/20/19 1640 02/09/20 1226 02/10/20 0500 02/11/20 0105  WBC 6.1 10.5 9.6 10.1  HGB 10.6* 11.1* 10.3* 9.7*  HCT 35.4 37.7 35.1* 32.7*  PLT 257 396 384 389    COAGS: No results for input(s): INR, APTT in the last 8760 hours.  BMP: Recent Labs    02/09/20 1226 02/10/20 0500 02/11/20 0105  NA 134* 137 139  K 3.1* 3.6 3.4*  CL 104 108 109  CO2 20* 22 20*  GLUCOSE 144* 141* 120*  BUN 9 9 6   CALCIUM 10.8* 10.4* 9.9  CREATININE 0.62 0.68 0.58  GFRNONAA >60 >60 >60  GFRAA >60 >60 >60    LIVER FUNCTION TESTS: Recent Labs    02/09/20 1226 02/10/20 0500 02/11/20 0105  BILITOT 1.0 1.2 1.9*  AST 29 64* 118*  ALT 55* 61* 71*  ALKPHOS 1,066* 1,006* 920*  PROT 7.7 7.0 6.5  ALBUMIN 3.4* 3.0* 2.7*   Assessment and Plan:  39 y.o, female inpatient. 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 presented to this facility with abdominal pain nausea and vomiting X 5 days. 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 CT abd pelvis from 7.28.21 reads Numerous chronic or subacute appearing pseudo cysts in the region of the pancreatic tail. The largest measures 10 cm in diameter. This is in the region of the ligament of Treitz and appears to be exerting some mass effect upon the small bowel at that level, possibly with relative small bowel obstruction. Numerous other small pseudocysts in the region of the pancreatic tail and  retroperitoneum Team os concerned that pseudo cyst is causing pressure on the afferent limb. Pathology from EGD performed on 7.30.21 shows one large marginal  Ulcer and a smaller marginal ulcer. requesting drain placement to the pancreatic pseudo cyst.   Patient is allergic to PCN.  AST 118, ALT 71 (down trending), Potassium 3.4, Total bilirubin 1.9 (up trending). All other labs are within acceptable parameters. Patient is on subcutaneous prophylactic dose  of lovenox last dose given on 7.30.21 @ 14:07. Patient is afebrile. Patient report a gnawing feeling to her left flank that is dulled but not fully resolved with pain medication. Nausea relieved with antiemetic.    IR consulted for possible drain placement in a pancreatic pseudo cyst. Case has been reviewed and procedure approved by Dr. Pascal Lux.  Patient tentatively scheduled for 7.31.21.  Team instructed to: Keep Patient to be NPO after midnight Hold prophylactic anticoagulation 24 hours prior to scheduled procedure.   IR will call patient when ready.   Risks and benefits discussed with the patient including bleeding, infection, damage to adjacent structures, bowel perforation/fistula connection, and sepsis.  All of the patient's questions were answered, patient is agreeable to proceed.  Consent signed and in chart.   Thank you for this interesting consult.  I greatly enjoyed meeting [facility] and look forward to participating in their care.  A copy of this report was sent to the requesting provider on this date.  Electronically Signed: Jacqualine Mau, NP 02/11/2020, 3:19 PM   I spent a total of 40 Minutes    in face to face in clinical consultation, greater than 50% of which was counseling/coordinating care for pancreatic pseudo cyst drain placement.

## 2020-02-11 NOTE — Progress Notes (Addendum)
1 Day Post-Op  Subjective: CC: Patient on CLD currently. Taking small sips of Gatorade/ice chips/water. Notes nausea around times of getting medication and with oral ingestion of liquids. She has some hypersalivation and sometimes burps with return of some fluid in her mouth that she spits out. Pain has now localized to the LUQ. Feels her skin over the LUQ is "hot". Passing flatus. No BM since 7/28.   Objective: Vital signs in last 24 hours: Temp:  [97.9 F (36.6 C)-98.8 F (37.1 C)] 98.1 F (36.7 C) (07/30 0410) Pulse Rate:  [96-105] 96 (07/30 0410) Resp:  [18-24] 18 (07/30 0410) BP: (120-155)/(71-97) 120/72 (07/30 0410) SpO2:  [94 %-99 %] 99 % (07/30 0410) Weight:  [125.2 kg] 125.2 kg (07/29 1333) Last BM Date: 02/09/20  Intake/Output from previous day: 07/29 0701 - 07/30 0700 In: 2306 [I.V.:2306] Out: -  Intake/Output this shift: Total I/O In: 240 [P.O.:240] Out: -   PE: Gen:  Alert, NAD, pleasant Pulm: rate and effort normal.  Abd: Soft, mild distension, epigastric>LUQ abdominal tenderness, +BS. No skin erythema.  Psych: A&Ox3  Skin: no rashes noted, warm and dry  Lab Results:  Recent Labs    02/10/20 0500 02/11/20 0105  WBC 9.6 10.1  HGB 10.3* 9.7*  HCT 35.1* 32.7*  PLT 384 389   BMET Recent Labs    02/10/20 0500 02/11/20 0105  NA 137 139  K 3.6 3.4*  CL 108 109  CO2 22 20*  GLUCOSE 141* 120*  BUN 9 6  CREATININE 0.68 0.58  CALCIUM 10.4* 9.9   PT/INR No results for input(s): LABPROT, INR in the last 72 hours. CMP     Component Value Date/Time   NA 139 02/11/2020 0105   K 3.4 (L) 02/11/2020 0105   CL 109 02/11/2020 0105   CO2 20 (L) 02/11/2020 0105   GLUCOSE 120 (H) 02/11/2020 0105   BUN 6 02/11/2020 0105   CREATININE 0.58 02/11/2020 0105   CALCIUM 9.9 02/11/2020 0105   PROT 6.5 02/11/2020 0105   ALBUMIN 2.7 (L) 02/11/2020 0105   AST 118 (H) 02/11/2020 0105   ALT 71 (H) 02/11/2020 0105   ALKPHOS 920 (H) 02/11/2020 0105   BILITOT  1.9 (H) 02/11/2020 0105   GFRNONAA >60 02/11/2020 0105   GFRAA >60 02/11/2020 0105   Lipase     Component Value Date/Time   LIPASE 113 (H) 02/09/2020 1226       Studies/Results: CT Abdomen Pelvis W Contrast  Result Date: 02/09/2020 CLINICAL DATA:  History of pancreatitis. Abdominal pain. Nausea and vomiting. EXAM: CT ABDOMEN AND PELVIS WITH CONTRAST TECHNIQUE: Multidetector CT imaging of the abdomen and pelvis was performed using the standard protocol following bolus administration of intravenous contrast. CONTRAST:  151mL OMNIPAQUE IOHEXOL 300 MG/ML  SOLN COMPARISON:  None. FINDINGS: Lower chest: Elevation of the left hemidiaphragm. Atelectasis at the lung bases left worse than right. Hepatobiliary: Liver parenchyma appears normal. Previous cholecystectomy. Pancreas: Numerous cysts in the region of the pancreatic tail consistent with pseudocysts. The largest measures up to 10 cm in size. Numerous other smaller cysts. I do not see evidence of active pancreatitis such as swelling or retroperitoneal edema. Spleen: Normal Adrenals/Urinary Tract: No primary adrenal pathology is seen. 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 Small nonobstructing renal calculi on both sides. No renal parenchymal lesion. No hydronephrosis. Bladder is normal. Stomach/Bowel: Previous surgery at the gastroesophageal junction. Fluid-filled stomach. 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 Distal to that, there are a few dilated loops of jejunum, though not as pronounced. There is been previous small bowel anastomotic site surgery in that region. More distal small bowel is entirely normal. No evidence of colon obstruction or pathology. Vascular/Lymphatic: Aorta and IVC are normal. No retroperitoneal adenopathy. Reproductive: Uterus shows a leiomyoma projecting dorsally from the fundus measuring 3.3 cm. No ovarian lesion. Other: No free fluid or air.  Musculoskeletal: Healing or nonunited fracture at the inferior pubic ramus on the right. No spinal pathology is seen. Osteoarthritis of the sacroiliac joints is noted. Endplate Schmorl's nodes are seen within the lumbar spine. IMPRESSION: Numerous chronic or subacute appearing pseudo cysts in the region of the pancreatic tail. The largest measures 10 cm in diameter. This is in the region of the ligament of Treitz and appears to be exerting some mass effect upon the small bowel at that level, possibly with relative small bowel obstruction. Numerous other small pseudocysts in the region of the pancreatic tail and retroperitoneum. Previous surgery at the gastroesophageal junction region. Previous small bowel anastomotic surgery in the jejunal region. Distal small bowel appears normal. No suspicion of active/acute pancreatitis. Elevation of left hemidiaphragm. Basilar atelectasis left more than right. Electronically Signed   By: Nelson Chimes M.D.   On: 02/09/2020 20:36   DG Chest Port 1 View  Result Date: 02/09/2020 CLINICAL DATA:  Shortness of breath for several days EXAM: PORTABLE CHEST 1 VIEW COMPARISON:  None. FINDINGS: Cardiac shadow is mildly prominent but accentuated by the portable technique. Minimal left basilar atelectasis is noted. The left hemidiaphragm is elevated. No bony abnormality is seen. IMPRESSION: Mild left basilar atelectasis. Electronically Signed   By: Inez Catalina M.D.   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 They were able to advanced the endoscope approximately 35 cm beyond the anastamosis without any significant areas of extraluminal compression. - No indication  for acute surgical intervention at this time - Diet per GI. If has further n/v, consider NPO/NGT. - Pancreatic pseudocyst management per GI - Keep K > 4 and Mg >2 for bowel function - Mobilize for bowel function  FEN: CLD, IVF VTE: SCDs, ok to have chemical prophylaxis from a surgery standpoint ID: no current abx   LOS: 1 day    Jillyn Ledger , [facility] 02/11/2020, 8:11 AM Please see Amion for pager number during day hours 7:00am-4:30pm

## 2020-02-11 NOTE — Anesthesia Postprocedure Evaluation (Signed)
Anesthesia Post Note  Patient: Alexis Avila  Procedure(s) Performed: ENTEROSCOPY (N/A ) BIOPSY     Patient location during evaluation: PACU Anesthesia Type: MAC Level of consciousness: awake and alert and oriented Pain management: pain level controlled Vital Signs Assessment: post-procedure vital signs reviewed and stable Respiratory status: spontaneous breathing, nonlabored ventilation and respiratory function stable Cardiovascular status: blood pressure returned to baseline Postop Assessment: no apparent nausea or vomiting Anesthetic complications: no   No complications documented.           Brennan Bailey

## 2020-02-11 NOTE — Progress Notes (Signed)
Family Medicine Teaching Service Daily Progress Note Intern Pager: [phone]  Patient name: Alexis Avila Medical record number: [id number] Date of birth: [birth year] Age: 39 y.o. Gender: female  Primary Care Provider: [name], MD Consultants: GI Code Status: FULL  Pt Overview and Major Events to Date:  Admitted 7/28  Assessment and Plan: Junnie Boltonis a 39 y.o.femalepresenting with 5-day history epigastric abdominal pain, found to havepartialSBO 2/2 pancreatic pseudocyst.. PMH is significant forGERD and pancreatitis.   Gastric ulcers Patient received endoscopy yesterday, 7/29. Esophagitis with no bleeding in the lower third of the esophagus detected. Small diverticulum in the gastric body adjacent to the Roux-en-Y anastomosis. Anastomosis was characterized by 2 marginal ulcers w/ clean base. Biopsies were taken from each ulcer. No evidence of significant pathology in the entire examined portion of the duodenum and jejunum. GI recommends Protonix 40mg  PO BID for at least 8 weeks. Sucralfate 1g QID. Depending on the response to therapy they may discuss whether or not pancreatic pseudocysts require further endoscopic intervention.  - Protonix 40 BID for 8 weeks - Sucralfate 1g ac/qh - Advance diet  Partial SBOpancreatic pseudocyst K+ 3.4, Na 139, BGL 120. AST 118, ALT 71, ALP 920. ALP trending down. WBC 10.1. Hgb 9.7 post procedure. Patient does not like Morphine she feels that it is only making her more nauseous. She continues to have abdominal pain. Her emesis continues to be small amount of stomach clear fluid. Patient reports that her normal BM are 3x daily, but she has not gone in at least 1 day. GI would like IR to see patient regarding percutaneous drainage of dominant pseudocyst. Surgery recommends considering NPO/NGT should n/v continues. At this time no indication for acute surgical intervention. -Vitals per floor protocol - mIVF -A.m. CMP/CBC -Follow-up GI and  surgeryrecs -Discontinue Morphine to 2 mg every 2 as needed for pain -  TrialTramadol one time for pain -Continue Zofranas neededfor nausea  - Continue Clear liquid diet -Lovenox for DVT prophylaxis - Scheduled Miralax  Elevated alkaline phosphatase  ALP 1,066> 920,AST 29>64>118, ALT 55>61>71.  Likely in the setting of known pancreatic pseudocystsand partial SBO that could be causing blockage in biliary system. - Monitor patient for constipation - Advance diet - miralax scheduled   Shortness of breath Patient does not endorse shortness of breath. CXR  was significant for mild left basilar atelectasis. The left hemidiaphragm is elevated. Patient sounded clear on exam and was satting 94-99% on RA.  - cont to monitor  Recent diagnosis of UTI- stable Patient reports no pain w/ urination. WBC 910.1. Patient remains afebrile. No pain w/ urination. There does not appear to be signs of active infection at this time. UA was positive for nitrites, protein, moderate bilirubin. Urine had small Hgb, cloudy and orange; however patient is currently menstruating.   Hypokalemia Mild Hyponatremia- improved K+ 3.4.  - AM CMP - Diet clear liquids - Will continue to monitor  Normocytic anemiahistory of B12 deficiency anemias/p gastric bypass Am Hgb 9.7 post procedure.Last hemoglobin in chart on 07/20/2019 was 10.6. B12 261, Folate 8.9.  -Transfusion threshold of 7 due to lack of CAD/PAD comorbidities -Follow-up morning CBC  Hyperglycemia AM BGL 120. HgbA1c 4.4   Borderline normal EKG Admission EKG showed NSR, borderline conduction delays, appears unchanged from previous. AM EKG was sinus tachy with QTc 446. There was signs of posterior and anterior infarct of undetermined age.    Hxpancreatitis  Heavy alcohol use In 2018, with hospitalization. Etiology of heavy alcohol use. She has since  cut back, now drinking twice a week with reported 3-4 drinks per episode.  Drink of choice white wine or vodka. Last drink 2 weeks ago. Most recent CIWA 0.  Labs as above. - monitor CIWAs without ativan - Educate patient on alcohol and gastric ulcers  GERD Currently no complaints of heartburn. -Consider Tums or Protonix as needed for symptoms    FEN/GI: Clear liquid diet PPx: Lovenox  Disposition: Home  Subjective:  Patient had no overnight events. She continues to have nausea and abdominal pain. She feels that the nausea may be causing her nausea. She is aware of the biopsies taken during EGD yesterday. Patient is slowly attempting to advance her diet.   Objective: Temp:  [97.9 F (36.6 C)-98.8 F (37.1 C)] 98.1 F (36.7 C) (07/30 0410) Pulse Rate:  [96-105] 96 (07/30 0410) Resp:  [18-24] 18 (07/30 0410) BP: (119-155)/(71-97) 120/72 (07/30 0410) SpO2:  [94 %-99 %] 99 % (07/30 0410) Weight:  [125.2 kg] 125.2 kg (07/29 1333) Physical Exam: General: NAD, sitting up in bed, boyfriend was in the room Cardiovascular: Regular rate and rhythm, no murmur detected Respiratory: Clear and equal bil, no extra work of breathing noted Abdomen:  LUQ pain to palpation, normoactive bowel sounds Extremities: Distal pulses intact  Laboratory: Recent Labs  Lab 02/09/20 1226 02/10/20 0500 02/11/20 0105  WBC 10.5 9.6 10.1  HGB 11.1* 10.3* 9.7*  HCT 37.7 35.1* 32.7*  PLT 396 384 389   Recent Labs  Lab 02/09/20 1226 02/10/20 0500 02/11/20 0105  NA 134* 137 139  K 3.1* 3.6 3.4*  CL 104 108 109  CO2 20* 22 20*  BUN 9 9 6   CREATININE 0.62 0.68 0.58  CALCIUM 10.8* 10.4* 9.9  PROT 7.7 7.0 6.5  BILITOT 1.0 1.2 1.9*  ALKPHOS 1,066* 1,006* 920*  ALT 55* 61* 71*  AST 29 64* 118*  GLUCOSE 144* 141* 120*      Imaging/Diagnostic Tests: None new  Freida Busman, MD 02/11/2020, 7:37 AM PGY-1, [facility] Intern pager: [phone], text pages welcome

## 2020-02-11 NOTE — Progress Notes (Addendum)
Daily Rounding Note  02/11/2020, 10:36 AM  LOS: 1 day   SUBJECTIVE:   Chief complaint:  Nausea and scant "spitting up", mostly clear phlegm material, as well as epigastric and LUQ pain ongoing.  Having to use zofran as often as available.  Periodic Morphine as well.  Not much change from previous.  No BM > 24 hours.     OBJECTIVE:         Vital signs in last 24 hours:    Temp:  [97.9 F (36.6 C)-98.8 F (37.1 C)] 98.1 F (36.7 C) (07/30 0410) Pulse Rate:  [96-105] 96 (07/30 0410) Resp:  [18-24] 18 (07/30 0410) BP: (120-155)/(71-97) 120/72 (07/30 0410) SpO2:  [94 %-99 %] 99 % (07/30 0410) Weight:  [125.2 kg] 125.2 kg (07/29 1333) Last BM Date: 02/09/20 Filed Weights   02/10/20 0425 02/10/20 1329 02/10/20 1333  Weight: (!) 125.2 kg (!) 125.2 kg (!) 125.2 kg   General: looks well, comfortable, alert   Heart: RRR Chest: clear bil.  No SOB or cough Abdomen: soft, epigastric tenderness into RUQ without G/R.  BS active, normal quality  Extremities: no CCE Neuro/Psych:  Alert, calm, pleasant.  Fluid speech   Intake/Output from previous day: 07/29 0701 - 07/30 0700 In: 2306 [I.V.:2306] Out: -   Intake/Output this shift: Total I/O In: 540 [P.O.:540] Out: -   Lab Results: Recent Labs    02/09/20 1226 02/10/20 0500 02/11/20 0105  WBC 10.5 9.6 10.1  HGB 11.1* 10.3* 9.7*  HCT 37.7 35.1* 32.7*  PLT 396 384 389   BMET Recent Labs    02/09/20 1226 02/10/20 0500 02/11/20 0105  NA 134* 137 139  K 3.1* 3.6 3.4*  CL 104 108 109  CO2 20* 22 20*  GLUCOSE 144* 141* 120*  BUN '9 9 6  ' CREATININE 0.62 0.68 0.58  CALCIUM 10.8* 10.4* 9.9   LFT Recent Labs    02/09/20 1226 02/10/20 0500 02/11/20 0105  PROT 7.7 7.0 6.5  ALBUMIN 3.4* 3.0* 2.7*  AST 29 64* 118*  ALT 55* 61* 71*  ALKPHOS 1,066* 1,006* 920*  BILITOT 1.0 1.2 1.9*   PT/INR No results for input(s): LABPROT, INR in the last 72 hours. Hepatitis  Panel No results for input(s): HEPBSAG, HCVAB, HEPAIGM, HEPBIGM in the last 72 hours.  Studies/Results: CT Abdomen Pelvis W Contrast  Result Date: 02/09/2020 CLINICAL DATA:  History of pancreatitis. Abdominal pain. Nausea and vomiting. EXAM: CT ABDOMEN AND PELVIS WITH CONTRAST TECHNIQUE: Multidetector CT imaging of the abdomen and pelvis was performed using the standard protocol following bolus administration of intravenous contrast. CONTRAST:  163m OMNIPAQUE IOHEXOL 300 MG/ML  SOLN COMPARISON:  None. FINDINGS: Lower chest: Elevation of the left hemidiaphragm. Atelectasis at the lung bases left worse than right. Hepatobiliary: Liver parenchyma appears normal. Previous cholecystectomy. Pancreas: Numerous cysts in the region of the pancreatic tail consistent with pseudocysts. The largest measures up to 10 cm in size. Numerous other smaller cysts. I do not see evidence of active pancreatitis such as swelling or retroperitoneal edema. Spleen: Normal Adrenals/Urinary Tract: No primary adrenal pathology is seen. 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 Small nonobstructing renal calculi on both sides. No renal parenchymal lesion. No hydronephrosis. Bladder is normal. Stomach/Bowel: Previous surgery at the gastroesophageal junction. Fluid-filled stomach. Dilated duodenum to the ligament of Treitz region, where the largest pseudo cyst could be resulting in obstruction. Distal to that, there are a few dilated loops of jejunum, though  not as pronounced. There is been previous small bowel anastomotic site surgery in that region. More distal small bowel is entirely normal. No evidence of colon obstruction or pathology. Vascular/Lymphatic: Aorta and IVC are normal. No retroperitoneal adenopathy. Reproductive: Uterus shows a leiomyoma projecting dorsally from the fundus measuring 3.3 cm. No ovarian lesion. Other: No free fluid or air. Musculoskeletal: Healing or nonunited fracture at the inferior pubic ramus on the right.  No spinal pathology is seen. Osteoarthritis of the sacroiliac joints is noted. Endplate Schmorl's nodes are seen within the lumbar spine. IMPRESSION: Numerous chronic or subacute appearing pseudo cysts in the region of the pancreatic tail. The largest measures 10 cm in diameter. This is in the region of the ligament of Treitz and appears to be exerting some mass effect upon the small bowel at that level, possibly with relative small bowel obstruction. Numerous other small pseudocysts in the region of the pancreatic tail and retroperitoneum. Previous surgery at the gastroesophageal junction region. Previous small bowel anastomotic surgery in the jejunal region. Distal small bowel appears normal. No suspicion of active/acute pancreatitis. Elevation of left hemidiaphragm. Basilar atelectasis left more than right. Electronically Signed   By: Nelson Chimes M.D.   On: 02/09/2020 20:36   DG Chest Port 1 View  Result Date: 02/09/2020 CLINICAL DATA:  Shortness of breath for several days EXAM: PORTABLE CHEST 1 VIEW COMPARISON:  None. FINDINGS: Cardiac shadow is mildly prominent but accentuated by the portable technique. Minimal left basilar atelectasis is noted. The left hemidiaphragm is elevated. No bony abnormality is seen. IMPRESSION: Mild left basilar atelectasis. Electronically Signed   By: Inez Catalina M.D.   On: 02/09/2020 19:34    Scheduled Meds:  pantoprazole  40 mg Oral BID   polyethylene glycol  17 g Oral Daily   potassium chloride  30 mEq Oral BID   sodium chloride flush  3 mL Intravenous Once   sucralfate  1 g Oral TID WC & HS   Continuous Infusions:  sodium chloride 165 mL/hr at 02/11/20 0513   PRN Meds:.ondansetron (ZOFRAN) IV   ASSESMENT:   *  Nausea, abdominal pain.   02/10/20 EGD: mild to moderated reflux esophagitis.  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 On Protonix 40 po bid and carafate AC,.   Pancreatic tail pseudocysts, one large and causing relative DOO on CT  LFTs w rising  t bili and alk phos, better transaminases.  *   Anemia.  Hgb 11.1 >> 9.7   PLAN   *   Continue supportive care.  Leave on clears for now.    *   Ask IR to see re perc drainage of dominant pseudocyst.      Azucena Freed  02/11/2020, 10:36 AM Phone [phone]

## 2020-02-11 NOTE — Plan of Care (Signed)
  Problem: Education: Goal: Knowledge of General Education information will improve Description: Including pain rating scale, medication(s)/side effects and non-pharmacologic comfort measures Outcome: Progressing   Problem: Health Behavior/Discharge Planning: Goal: Ability to manage health-related needs will improve Outcome: Progressing   Problem: Clinical Measurements: Goal: Ability to maintain clinical measurements within normal limits will improve Outcome: Progressing Goal: Respiratory complications will improve Outcome: Progressing   Problem: Activity: Goal: Risk for activity intolerance will decrease Outcome: Progressing   Problem: Nutrition: Goal: Adequate nutrition will be maintained Outcome: Progressing   Problem: Coping: Goal: Level of anxiety will decrease Outcome: Progressing   Problem: Pain Managment: Goal: General experience of comfort will improve Outcome: Progressing   Problem: Safety: Goal: Ability to remain free from injury will improve Outcome: Progressing   Problem: Skin Integrity: Goal: Risk for impaired skin integrity will decrease Outcome: Progressing   

## 2020-02-12 ENCOUNTER — Inpatient Hospital Stay (HOSPITAL_COMMUNITY): Payer: BC Managed Care – PPO

## 2020-02-12 LAB — CBC WITH DIFFERENTIAL/PLATELET
Abs Immature Granulocytes: 0.08 10*3/uL — ABNORMAL HIGH (ref 0.00–0.07)
Basophils Absolute: 0 10*3/uL (ref 0.0–0.1)
Basophils Relative: 0 %
Eosinophils Absolute: 0 10*3/uL (ref 0.0–0.5)
Eosinophils Relative: 0 %
HCT: 30.5 % — ABNORMAL LOW (ref 36.0–46.0)
Hemoglobin: 8.8 g/dL — ABNORMAL LOW (ref 12.0–15.0)
Immature Granulocytes: 1 %
Lymphocytes Relative: 21 %
Lymphs Abs: 1.9 10*3/uL (ref 0.7–4.0)
MCH: 25 pg — ABNORMAL LOW (ref 26.0–34.0)
MCHC: 28.9 g/dL — ABNORMAL LOW (ref 30.0–36.0)
MCV: 86.6 fL (ref 80.0–100.0)
Monocytes Absolute: 0.9 10*3/uL (ref 0.1–1.0)
Monocytes Relative: 10 %
Neutro Abs: 6.2 10*3/uL (ref 1.7–7.7)
Neutrophils Relative %: 68 %
Platelets: 340 10*3/uL (ref 150–400)
RBC: 3.52 MIL/uL — ABNORMAL LOW (ref 3.87–5.11)
RDW: 19.8 % — ABNORMAL HIGH (ref 11.5–15.5)
WBC: 9.2 10*3/uL (ref 4.0–10.5)
nRBC: 0.3 % — ABNORMAL HIGH (ref 0.0–0.2)

## 2020-02-12 LAB — COMPREHENSIVE METABOLIC PANEL
ALT: 80 U/L — ABNORMAL HIGH (ref 0–44)
AST: 106 U/L — ABNORMAL HIGH (ref 15–41)
Albumin: 2.4 g/dL — ABNORMAL LOW (ref 3.5–5.0)
Alkaline Phosphatase: 813 U/L — ABNORMAL HIGH (ref 38–126)
Anion gap: 8 (ref 5–15)
BUN: 5 mg/dL — ABNORMAL LOW (ref 6–20)
CO2: 20 mmol/L — ABNORMAL LOW (ref 22–32)
Calcium: 9.4 mg/dL (ref 8.9–10.3)
Chloride: 108 mmol/L (ref 98–111)
Creatinine, Ser: 0.61 mg/dL (ref 0.44–1.00)
GFR calc Af Amer: >60 mL/min (ref >60)
GFR calc non Af Amer: >60 mL/min (ref >60)
Glucose, Bld: 111 mg/dL — ABNORMAL HIGH (ref 70–99)
Potassium: 3.3 mmol/L — ABNORMAL LOW (ref 3.5–5.1)
Sodium: 136 mmol/L (ref 135–145)
Total Bilirubin: 2.8 mg/dL — ABNORMAL HIGH (ref 0.3–1.2)
Total Protein: 6.2 g/dL — ABNORMAL LOW (ref 6.5–8.1)

## 2020-02-12 LAB — AMYLASE, PLEURAL OR PERITONEAL FLUID: Amylase, Fluid: UNDETERMINED U/L

## 2020-02-12 LAB — MAGNESIUM: Magnesium: 1.7 mg/dL (ref 1.7–2.4)

## 2020-02-12 MED ORDER — ACETAMINOPHEN 325 MG PO TABS
650.0000 mg | ORAL_TABLET | Freq: Four times a day (QID) | ORAL | Status: DC | PRN
  Administered 2020-02-13 – 2020-02-14 (×2): 650 mg via ORAL
  Filled 2020-02-12 (×3): qty 2

## 2020-02-12 MED ORDER — LIDOCAINE VISCOUS HCL 2 % MT SOLN
15.0000 mL | Freq: Every day | OROMUCOSAL | Status: DC | PRN
  Filled 2020-02-12: qty 15

## 2020-02-12 MED ORDER — ACETAMINOPHEN 325 MG PO TABS: 650.0000 mg | ORAL_TABLET | Freq: Four times a day (QID) | ORAL | Status: DC | PRN

## 2020-02-12 MED ORDER — FENTANYL CITRATE (PF) 100 MCG/2ML IJ SOLN
INTRAMUSCULAR | Status: AC
  Filled 2020-02-12: qty 2

## 2020-02-12 MED ORDER — ACETAMINOPHEN 80 MG RE SUPP
80.0000 mg | Freq: Four times a day (QID) | RECTAL | Status: DC | PRN
  Filled 2020-02-12: qty 1

## 2020-02-12 MED ORDER — MIDAZOLAM HCL 2 MG/2ML IJ SOLN
INTRAMUSCULAR | Status: AC
  Filled 2020-02-12: qty 4

## 2020-02-12 MED ORDER — LIDOCAINE-EPINEPHRINE 1 %-1:100000 IJ SOLN
INTRAMUSCULAR | Status: AC
  Filled 2020-02-12: qty 1

## 2020-02-12 MED ORDER — ACETAMINOPHEN 80 MG RE SUPP: 80.0000 mg | Freq: Four times a day (QID) | RECTAL | Status: DC | PRN

## 2020-02-12 MED ORDER — ALUM & MAG HYDROXIDE-SIMETH 200-200-20 MG/5ML PO SUSP
30.0000 mL | Freq: Every day | ORAL | Status: DC | PRN
  Administered 2020-02-12: 30 mL via ORAL
  Filled 2020-02-12: qty 30

## 2020-02-12 MED ORDER — FENTANYL CITRATE (PF) 100 MCG/2ML IJ SOLN
INTRAMUSCULAR | Status: DC | PRN
  Administered 2020-02-12 (×2): 50 ug via INTRAVENOUS

## 2020-02-12 MED ORDER — POTASSIUM CHLORIDE CRYS ER 20 MEQ PO TBCR
30.0000 meq | EXTENDED_RELEASE_TABLET | Freq: Once | ORAL | Status: AC
  Administered 2020-02-12: 30 meq via ORAL
  Filled 2020-02-12: qty 1

## 2020-02-12 MED ORDER — MIDAZOLAM HCL 2 MG/2ML IJ SOLN
INTRAMUSCULAR | Status: DC | PRN
  Administered 2020-02-12 (×2): 1 mg via INTRAVENOUS

## 2020-02-12 MED ORDER — LIDOCAINE HCL (PF) 1 % IJ SOLN
INTRAMUSCULAR | Status: DC | PRN
  Administered 2020-02-12: 10 mL

## 2020-02-12 NOTE — Sedation Documentation (Signed)
250cc aspirated from pseudocyst

## 2020-02-12 NOTE — Progress Notes (Addendum)
Family Medicine Teaching Service Daily Progress Note Intern Pager: [phone]  Patient name: Alexis Avila Medical record number: [id number] Date of birth: 09/14/1980 Age: 39 y.o. Gender: female  Primary Care Provider: [name], MD Consultants: GI and surgery Code Status: FULL  Pt Overview and Major Events to Date:  Admitted 7/28  Assessment and Plan:  Alexis Avila a 39 y.o.femalepresenting with 5-day history epigastric abdominal pain, found to havepartialSBO 2/2 pancreatic pseudocyst.. PMH is significant forGERD and pancreatitis.   Gastric ulcers GI and surgery are unclear if the patient's pain is from her ulcers or her pseudocysts. Biopsies of ulcer returned:Inflammation but negative for H. Pylori.  - Continue Protonix 40 BID for 8 weeks - Continue Sucralfate 1g ac/qh - Advance diet as able  Partial SBOpancreatic pseudocyst, likely chronic GI spoke with the patient about her large pseudocyst which appears to be compressing her small bowel and causing afferent limb syndrome with dilation of GI structures upstream.GI discussed with patient potential approach to drainage to include complex endoscopic drainage which could require axios stent from gastric pouch to gastric remnant then another stent placed from gastric remnant into the cyst (cyst gastrostomy).OR percutaneous drainage of the percutaneous approach by IR. Surgery does not think the patient is a candidate for cystgastrostomy. Surgery recommends keepin K>4 and Mg >2 for bowel function. If patient continues to have n/v consider NPO/NGT per GI. AST [id number]. ALT 61>71>80. ALP 1006>920>813.  Patient was in significant pain overnight one time trial tramadol was unsuccessful. Patient received Norco 5 and Norco 7.5. WBC 9.2.  IR saw patient and are planning for drain placement in a pancreatic pseudo cyst today, 7/31. Hgb 8.8. -Vitals per floor protocol - mIVF -A.m. CMP/CBC -Follow-up GI and surgeryrecs - f/u IR  procedure - continue LFT trend -f/u Iron panel, per GI -Continue Zofranas neededfor nausea  - NPO a MD for procedure -Lovenox for DVT prophylaxis held for procedure, SCDs - mobilize for bowel  function - Scheduled Miralax  Elevated alkaline phosphatase  ALP1,066> 920>813,AST 29>64>118>106, ALT 55>61>71>80. Likely in the setting of known pancreatic pseudocystsand partial SBO that could be causing blockage in biliary system. - Monitor patient for constipation - miralax scheduled   Shortness of breath Patientdoes not endorseshortness of breath. CXR 7/28, was significant for mild left basilar atelectasis. The left hemidiaphragm is elevated. Patient sounded clear on exam and was satting 94-99% on RA.  - cont to monitor  Recent diagnosis of UTI- stable Patient reports no pain w/ urination. WBC 9.2. Patient remains afebrile. No pain w/ urination. There does not appear to be signs of active infection at this time.UA was positive for nitrites, protein, moderate bilirubin. Urine had small Hgb, cloudy and orange; however patient is currently menstruating.   Hypokalemia Mild Hyponatremia- improved K+ 3.3.  - Per GI keep K>4 and Mg>2 for Bowel function -AM CMP - Diet NPO pre procedure - Will continue to monitor  Normocytic anemiahistory of B12 deficiency anemias/p gastric bypass Am Hgb 8.8Last hemoglobin in chart on 07/20/2019 was 10.6.B12 261, Folate 8.9. -Transfusion threshold of 7 due to lack of CAD/PAD comorbidities -Follow-up morning CBC  Hyperglycemia AM BGL 111. HgbA1c 4.4  Borderline normal EKG Admission EKG showed NSR, borderline conduction delays, appears unchanged from previous.AM EKG was sinus tachy with QTc 446. There was signs of posterior and anterior infarct of undetermined age.    Hxpancreatitis  Heavy alcohol use In 2018, with hospitalization. Etiology of heavy alcohol use. She has since cut back, now drinking twice a week  with reported 3-4 drinks per episode. Drink of choice white wine or vodka. Last drink 2 weeks ago.Most recent CIWA 0. Labs as above. - monitor CIWAs without ativan - Educate patient on alcohol and gastric ulcers  GERD Currently no complaints of heartburn. -Protonix 40 BID for 8 weeks     FEN/GI: NPO @ [facility] PPx: Held for IR procedure + SCDs  Disposition: Home  Subjective:  Patient required pain management throughout the night: Norco 5>Norco 7.5.. Patient is aware of her endoscopy results and her impending IR for drainage of separate pseudocyst today. Patient continues to have nausea and pain, but is hopeful that the drainage will help decrease some pain.  She does report a stool this morning. No emesis overnight.   Objective: Temp:  [98.4 F (36.9 C)-99.3 F (37.4 C)] 99.3 F (37.4 C) (07/31 0502) Pulse Rate:  [104-105] 105 (07/31 0505) Resp:  [18-20] 20 (07/31 0502) BP: (122-128)/(70-80) 128/80 (07/31 0502) SpO2:  [94 %-98 %] 94 % (07/31 0502) Physical Exam: General: NAD, appropriate mood and affect Cardiovascular: Regular heart and rhythm, no murmur Respiratory: Clear and equal bil. No extra work of breathing noted Abdomen: LUQ abd pain, normoactive bowel sounds Extremities: Distal pulses intact  Laboratory: Recent Labs  Lab 02/10/20 0500 02/11/20 0105 02/12/20 0243  WBC 9.6 10.1 9.2  HGB 10.3* 9.7* 8.8*  HCT 35.1* 32.7* 30.5*  PLT 384 389 340   Recent Labs  Lab 02/10/20 0500 02/11/20 0105 02/12/20 0243  NA 137 139 136  K 3.6 3.4* 3.3*  CL 108 109 108  CO2 22 20* 20*  BUN 9 6 5*  CREATININE 0.68 0.58 0.61  CALCIUM 10.4* 9.9 9.4  PROT 7.0 6.5 6.2*  BILITOT 1.2 1.9* 2.8*  ALKPHOS 1,006* 920* 813*  ALT 61* 71* 80*  AST 64* 118* 106*  GLUCOSE 141* 120* 111*      Imaging/Diagnostic Tests:   Freida Busman, MD 02/12/2020, 7:45 AM PGY-1, [place] Intern pager: [phone], text pages welcome

## 2020-02-12 NOTE — Hospital Course (Addendum)
Alexis Avila is a 39 y.o. 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 PMH is significant for GERD and pancreatitis.    Sepsis, likely source bowel perforation Patient became tachycardic with a fever and concern for bowel perforation.  Due to being in post operative period, bariatric surgery was consulted by general surgery. The patient was subsequently started on a 6-day course of metronidazole and ceftriaxone.  Bariatric surgery performed exploratory laparotomy with drainage of intra-abdominal hematoma/abscess, insertion of G-tube and remnant stomach, partial resection/reconstruction of Roux limb. Labs and vitals signs improved with source control.   Gastric ulcers GI was consulted and performed an EGD due to unexplained epigastric abdominal pain, nausea & vomiting.  Patient was determined to have LA grade B reflux esophagitis with no bleeding.  Patient was S/P Roux-en-Y gastrojejunostomy with gastrojejunal anastomosis.  Anastomoses characterized by 2 marginal ulcers both with clean bases.  Biopsies were taken and were negative for H pylori.  Per GI,  patient was treated with Protonix 40 p.o. twice daily for at least 8 weeks and sucralfate 1 g p.o. 4 times daily.   Small bowel obstruction, complicated by bowel perforation and drainage of pseudocysts At admission, AST 29, ALT 55, ALP 1066. Troponin levels were negative, Lipase 113 and GGT 116. Patient's pain in the ED was managed with morphine, dilaudid and zofran for nausea.  CT on admission revealed numerous pseudocysts in the tail of the pancreas with the largest measuring up to 10 cm.  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  Pain continued and patient required Norco. IR was consulted for drainage of pseudocyst resulting in drainage of 250 cc bilious appearing aspirate. This sample was sent for amylase,  lipase, and bilirubin levels and for culture analysis. Sample results revealed bowel contents so bariatric surgery was consulted and performed an exploratory laparotomy with placement of a JP drain. G tube remained in place and healing via wound vac was placed and wound allowed to heal with secondary intention. G tube was used for contrasted study. CT Abdomen noted no free air. Patient received PICC as her G tube could not be used for TPN if neccessary.    Pitting Edema Patient placed on mIVF became hypervolemic and mIVF was discontinued as patient was transitioned successfully to oral intake. Lasix was added.  Metabolic disarray Phos, Mg and K were monitored and replaced in order to prevent refeeding syndrome.   Acute blood loss Patient Hgb trended down after Ex-lap and patient received total 2 units of pRBC during hospitalization.   Elevated alkaline phosphatase Liver panels were trended throughout hospitalization. AST 29>118>106. ALT 55>71>80. ALP 1066>920>813>>>337.   Shortness of breath CXR 7/28 was significant for mild left basilar atelectasis. The left hemodiaphragm was elevated. The patient sounded clear bilaterally on exam throughout hospitalization and was satting >90% on room air. Post ex-lap patient reported some increasing SOB. CXR 8/3 found low lung volumes  w/ bibasilar atelectasis and possible fissural fluid pseudotumor. On the left side. Repeat 8/6 CXR noted left basilar opacity that was likely atelectasis/ consolidation w/ pleural fluid. Repeat 8/9 was negative for pneumonia.  Bil DVT US confirmed no DVTs.   Borderline normal EKG Admission EKG showed NSR, borderline conduction delays, appears unchanged from previous.  AM EKG was sinus tachy with QTc 446. 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  Continued patient on Zofran.  GERD  Patient had history of GERD but no home medications.  Noted to have mild to moderate reflux esophagitis during EGD.   Placed on Protonix 40 twice daily for 8 weeks and Carafate 4 times daily.

## 2020-02-12 NOTE — Progress Notes (Signed)
     [facility] Progress Note  CC:  Nausea, abdominal pain  Subjective:  Feels much better since drainage of pseudocyst this morning.  Tolerating liquids better.  Pain much better.  Objective:  Vital signs in last 24 hours: Temp:  [98.1 F (36.7 C)-99.3 F (37.4 C)] 98.1 F (36.7 C) (07/31 1203) Pulse Rate:  [105-111] 105 (07/31 1203) Resp:  [20-28] 20 (07/31 1203) BP: (115-128)/(65-80) 123/70 (07/31 1203) SpO2:  [94 %-99 %] 99 % (07/31 1203) Last BM Date: 02/09/20 General:  Alert, Well-developed, in NAD Heart:  Slightly tachy but regular rhythm; no murmurs Pulm:  CTAB.  No increased WOB. Abdomen:  Soft, non-distended.  BS present.  Mild TTP.  Extremities:  Without edema. Neurologic:  Alert and oriented x 4;  grossly normal neurologically. Psych:  Alert and cooperative. Normal mood and affect.  Intake/Output from previous day: 07/30 0701 - 07/31 0700 In: 2190 [P.O.:540; I.V.:1650] Out: -   Lab Results: Recent Labs    02/10/20 0500 02/11/20 0105 02/12/20 0243  WBC 9.6 10.1 9.2  HGB 10.3* 9.7* 8.8*  HCT 35.1* 32.7* 30.5*  PLT 384 389 340   BMET Recent Labs    02/10/20 0500 02/11/20 0105 02/12/20 0243  NA 137 139 136  K 3.6 3.4* 3.3*  CL 108 109 108  CO2 22 20* 20*  GLUCOSE 141* 120* 111*  BUN 9 6 5*  CREATININE 0.68 0.58 0.61  CALCIUM 10.4* 9.9 9.4   LFT Recent Labs    02/12/20 0243  PROT 6.2*  ALBUMIN 2.4*  AST 106*  ALT 80*  ALKPHOS 813*  BILITOT 2.8*   Assessment / Plan: *Nausea, abdominal pain: 02/10/20 EGD: mild to moderated reflux esophagitis.  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  Biopsies showed only mild active inflammation, negative Hpylori.  On Protonix 40 po bid and carafate ACHS. *Pancreatic tail pseudocysts, one large and causing relative DOO on CT:  Drained by IR today with 250 cc bilious fluid removed. *LFTs w rising t bili and slight bump in ALT but AST and ALP down-trending today.  ? Related to intermittent  pressure from large pseudocyst.  Should see improvement now that pseudocyst was drained. *Anemia.  Hgb 11.1 >> 9.7>>8.8 grams today.  No sign of bleeding.  Monitor.  -Continue pantoprazole 40 mg BID and carafate ACHS. -Trend LFTs.   LOS: 2 days   Laban Emperor. Haiven Nardone  02/12/2020, 3:14 PM

## 2020-02-12 NOTE — Progress Notes (Signed)
Agree with GI plan for cyst drainage  No role for acute surgical intervention at this point  Will continue to follow along

## 2020-02-12 NOTE — Procedures (Signed)
Pre procedural Dx: Abdominal fluid collection Post procedural Dx: Same  Technically successful CT guided aspiration of 250 cc of bilious appearing fluid from fluid collection within the left mid hemi-abdomen.  Contrast injection performed via small bore catheter used for drainage of the collection demonstrates opacification of the adjacent loops of small bowel -> thus fluid collection within the upper abdomen may represent either a focally more dilated loop of proximal jejunum versus (favored), a contained perforation.  Aspirated samples sent for amylase, lipase and bilirubin levels and for culture analysis.    EBL: None Complications: None immediate  D/W Dr. Bryan Lemma at the time of procedure completion.   Ronny Bacon, MD Pager #: [phone]

## 2020-02-13 DIAGNOSIS — R7989 Other specified abnormal findings of blood chemistry: Secondary | ICD-10-CM

## 2020-02-13 LAB — CBC WITH DIFFERENTIAL/PLATELET
Abs Immature Granulocytes: 0.09 10*3/uL — ABNORMAL HIGH (ref 0.00–0.07)
Basophils Absolute: 0 10*3/uL (ref 0.0–0.1)
Basophils Relative: 0 %
Eosinophils Absolute: 0 10*3/uL (ref 0.0–0.5)
Eosinophils Relative: 0 %
HCT: 30.3 % — ABNORMAL LOW (ref 36.0–46.0)
Hemoglobin: 8.8 g/dL — ABNORMAL LOW (ref 12.0–15.0)
Immature Granulocytes: 1 %
Lymphocytes Relative: 24 %
Lymphs Abs: 1.8 10*3/uL (ref 0.7–4.0)
MCH: 25.5 pg — ABNORMAL LOW (ref 26.0–34.0)
MCHC: 29 g/dL — ABNORMAL LOW (ref 30.0–36.0)
MCV: 87.8 fL (ref 80.0–100.0)
Monocytes Absolute: 0.5 10*3/uL (ref 0.1–1.0)
Monocytes Relative: 7 %
Neutro Abs: 5 10*3/uL (ref 1.7–7.7)
Neutrophils Relative %: 68 %
Platelets: 280 10*3/uL (ref 150–400)
RBC: 3.45 MIL/uL — ABNORMAL LOW (ref 3.87–5.11)
RDW: 19.9 % — ABNORMAL HIGH (ref 11.5–15.5)
WBC: 7.5 10*3/uL (ref 4.0–10.5)
nRBC: 0.3 % — ABNORMAL HIGH (ref 0.0–0.2)

## 2020-02-13 LAB — COMPREHENSIVE METABOLIC PANEL
ALT: 61 U/L — ABNORMAL HIGH (ref 0–44)
AST: 42 U/L — ABNORMAL HIGH (ref 15–41)
Albumin: 2.3 g/dL — ABNORMAL LOW (ref 3.5–5.0)
Alkaline Phosphatase: 823 U/L — ABNORMAL HIGH (ref 38–126)
Anion gap: 6 (ref 5–15)
BUN: <5 mg/dL — ABNORMAL LOW (ref 6–20)
CO2: 19 mmol/L — ABNORMAL LOW (ref 22–32)
Calcium: 8.8 mg/dL — ABNORMAL LOW (ref 8.9–10.3)
Chloride: 108 mmol/L (ref 98–111)
Creatinine, Ser: 0.59 mg/dL (ref 0.44–1.00)
GFR calc Af Amer: >60 mL/min (ref >60)
GFR calc non Af Amer: >60 mL/min (ref >60)
Glucose, Bld: 102 mg/dL — ABNORMAL HIGH (ref 70–99)
Potassium: 3.6 mmol/L (ref 3.5–5.1)
Sodium: 133 mmol/L — ABNORMAL LOW (ref 135–145)
Total Bilirubin: 0.9 mg/dL (ref 0.3–1.2)
Total Protein: 5.6 g/dL — ABNORMAL LOW (ref 6.5–8.1)

## 2020-02-13 NOTE — Progress Notes (Signed)
CALL PAGER [phone] for any questions or notifications regarding this patient  FMTS Attending Note: Dorcas Mcmurray MD I have reviewed the chart and labs, discussed with the resident and agree with the assessment and plan as documented in the resident's separate note (which is in progress) 1. Abdominal pain: EGD showed large marginal ulcer and we are treating per GI recommendations with BID PPI / carafate. Initially it was thought she also had a large pancreatic pseudocyst, but per GI and IR, surgery notes it may be this abnormality is a small loop of small bowel( rather than pseudocyst)  which would potentially need Gtube that would vent to stomach remnant (hx gastric bypass surgery)

## 2020-02-13 NOTE — Progress Notes (Signed)
Alexis Avila  CC:  Nausea and abdominal pain  Subjective:  Feeling bad again today.  Pain started again last night and then started with nausea and vomiting again this AM.  Objective:  Vital signs in last 24 hours: Temp:  [98.1 F (36.7 C)-100.9 F (38.3 C)] 100.9 F (38.3 C) (08/01 0555) Pulse Rate:  [96-110] 96 (08/01 0555) Resp:  [18-20] 18 (08/01 0555) BP: (115-137)/(66-80) 115/78 (08/01 0555) SpO2:  [98 %-100 %] 100 % (08/01 0555) Last BM Date: 02/12/20 General:  Alert, Well-developed, in NAD but does appear uncomfortable Heart:  Regular rate and rhythm; no murmurs Pulm:  CTAB.  No increased WOB. Abdomen:  Soft, non-distended.  BS present.  Diffuse tender to palpation. Extremities:  Without edema. Neurologic:  Alert and oriented x 4;  grossly normal neurologically. Psych:  Alert and cooperative. Normal mood and affect.  Intake/Output from previous day: 07/31 0701 - 08/01 0700 In: 200 [IV Piggyback:200] Out: -   Lab Results: Recent Labs    02/11/20 0105 02/12/20 0243 02/13/20 0326  WBC 10.1 9.2 7.5  HGB 9.7* 8.8* 8.8*  HCT 32.7* 30.5* 30.3*  PLT 389 340 280   BMET Recent Labs    02/11/20 0105 02/12/20 0243 02/13/20 0326  NA 139 136 133*  K 3.4* 3.3* 3.6  CL 109 108 108  CO2 20* 20* 19*  GLUCOSE 120* 111* 102*  BUN 6 5* <5*  CREATININE 0.58 0.61 0.59  CALCIUM 9.9 9.4 8.8*   LFT Recent Labs    02/13/20 0326  PROT 5.6*  ALBUMIN 2.3*  AST 42*  ALT 61*  ALKPHOS 823*  BILITOT 0.9   CT IMAGE GUIDED DRAINAGE BY PERCUTANEOUS CATHETER  Result Date: 02/12/2020 INDICATION: History of gastric bypass, now with indeterminate fluid collection (potentially a pseudocyst versus a focally dilated loop of bowel versus a contained enteric perforation) within the left mid hemiabdomen resulting in obstruction of the afferent limb. As such, request made for CT-guided aspiration and/or drainage catheter placement. EXAM: CT IMAGE GUIDED  DRAINAGE BY PERCUTANEOUS CATHETER COMPARISON:  CT abdomen and pelvis-02/09/2020 MEDICATIONS: The patient is currently admitted to the hospital and receiving intravenous antibiotics. The antibiotics were administered within an appropriate time frame prior to the initiation of the procedure. ANESTHESIA/SEDATION: Moderate (conscious) sedation was employed during this procedure. A total of Versed 2 mg and Fentanyl 100 mcg was administered intravenously. Moderate Sedation Time: 18 minutes. The patient's level of consciousness and vital signs were monitored continuously by radiology nursing throughout the procedure under my direct supervision. CONTRAST:  Approximately 50 cc Isovue 300, administered enterically via the temporary Yueh sheath catheter. COMPLICATIONS: None immediate. PROCEDURE: Informed written consent was obtained from the patient after a discussion of the risks, benefits and alternatives to treatment. The patient was placed supine on the CT gantry and a pre procedural CT was performed re-demonstrating the known fluid collection within the left mid hemiabdomen with dominant component measuring approximately 10.3 x 7.4 cm (image 43, series 2). The procedure was planned. A timeout was performed prior to the initiation of the procedure. The supine was prepped and draped in the usual sterile fashion. The overlying soft tissues were anesthetized with 1% lidocaine with epinephrine. Appropriate trajectory was planned with the use of a 22 gauge spinal needle. Next, a Yueh sheath catheter needle was advanced into the indeterminate fluid collection and a short Amplatz wire was coiled. Appropriate position was confirmed with limited CT imaging. Approximately 250 cc of bilious appearing  fluid was aspirated from the collection. Postprocedural imaging demonstrates near complete resolution dominant collection within the left mid hemiabdomen. At this time, approximately 50 cc of Isovue-300 was administered via the Yueh  sheath catheter. Two delayed CT scans were obtained and the Yueh sheath catheter was removed. A dressing was applied. The patient tolerated the procedure well without immediate postprocedural complication. FINDINGS: Preprocedural imaging demonstrates grossly unchanged size and appearance of the indeterminate fluid collection within the left mid hemiabdomen measuring at least 10.3 x 7.4 cm (image 43, series 2). After accessing the indeterminate fluid collection with a Yueh sheath catheter needle, approximately 250 cc of bilious appearing fluid was aspirated from the collection. An aspirated sample was capped and sent to the laboratory for amylase, lipase and bilirubin levels as well as culture analysis. Next, approximately 50 cc of Isovue-300 was administered via the Yueh sheath catheter needle demonstrating passage of contrast into the adjacent loops of the afferent limb of the gastric bypass (series 6 and 7). Avila is also made of a small amount of serpiginous extraluminal contrast (representative images 1, 7 and 9), potentially secondary to an enteric perforation versus retraction of a side hole of the Yueh sheath catheter outside the intestinal lumen. IMPRESSION: 1. Successful CT guided aspiration of approximately 250 cc of bilious appearing fluid from indeterminate fluid collection with the left mid hemiabdomen. Samples sent for amylase, lipase and bilirubin levels as well as culture analysis. 2. Following aspiration and near complete evacuation, contrast injection demonstrates opacification of adjacent afferent limbs suggesting that the indeterminate fluid collection with the left mid hemiabdomen may represent either a focally dilated patulous loop of the afferent limb versus a contained perforation as a result of afferent limb obstruction, presumably secondary to adhesions. Ultimately, pending results of submitted aspiration samples as well as the patient's response to today's aspiration, the patient may  benefit from venting gastrostomy tube (potentially performed with CT guidance given lack of ability to performed gastric insufflation due to gastric bypass) as indicated. Above findings discussed at length with referring gastroenterologist, Dr. Bryan Lemma. Electronically Signed   By: Sandi Mariscal M.D.   On: 02/12/2020 14:52   Assessment / Plan: *Nausea, abdominal pain: 02/10/20 EGD: mild to moderated reflux esophagitis. 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  Biopsies showed only mild active inflammation, negative Hpylori.  On Protonix 40 po bid and carafate ACHS. *Pancreatic tail pseudocysts, one large and causing relative DOO on CT:  Drained by IR 7/31 with 250 cc bilious fluid removed.  Dr. Bryan Lemma discussed with IR that the fluid collection may actually be more distended small bowel (favored) vs contained small bowel perforation rather than pancreatic pseudocyst. Contrast was injected during the procedure with opacification of the adjacent loops of small bowel, again favoring significantly dilated small bowel loop. *Elevated LFTs:  Improved today.  ? Related to intermittent pressure from large fluid collection.  Should see improvement now that pseudocyst/fluid was drained. *Anemia: Hgb 11.1 >>9.7>>8.8 grams again today.  No sign of bleeding.  Monitor.  -Continue pantoprazole 40 mg BID and carafate ACHS. -Trend LFTs. -Follow-up on pending labs from IR procedure.  If this does represent dilated small bowel and excluded gastric remnant, could be a role for venting G-tube into the remnant stomach. -Continue antiemetics and pain control as needed.   LOS: 3 days   Alexis Avila. Alexis Avila  02/13/2020, 11:29 AM

## 2020-02-13 NOTE — Progress Notes (Signed)
Went to pt bedside to assess pt.  She complains of abdominal pain, nausea, and SOB that are the same today as when they recurred last night.  Pt given anti-emetic medications.  Endorses two episodes of vomiting today as well as multiple bowel movements.    On exam she appeared to be in mild to moderate pain. Decreased air movement on lower left lobe consistent with atelectasis seen on CXR previously.  Lungs otherwise without wheezes or crackles.  Decreased bowel sounds.  Heart tachycardic and regular.    Advised pt to inform her nurse if she needs [place] for anything.  No additional workup needed at this time based on exam.

## 2020-02-13 NOTE — Progress Notes (Signed)
Family Medicine Teaching Service Daily Progress Note Intern Pager: [phone]  Patient name: Alexis Avila Medical record number: [id number] Date of birth: 03-02-1981 Age: 39 y.o. Gender: female  Primary Care Provider: [name], MD Consultants: GI and surgery  Code Status: Full   Pt Overview and Major Events to Date:  Admitted 7/28  Assessment and Plan:  Alexis Boltonis a 39 y.o.femalepresenting with 5-day history epigastric abdominal pain, found to havepartialSBO 2/2 pancreatic pseudocyst. PMH is significant forGERD and pancreatitis.   Gastric ulcers EGD showed large marginal ulcer. Biopsies of ulcer returned:Inflammation but negative for H. Pylori. Initially thought to be large pancreatic pseudocyst but surgery notes it may be a small loop of small bowel instead that could potentially need a gtube.  - Continue Protonix 40 BID for 8 weeks  - Continue Sucralfate 1g ac/qh  - Advance diet as able  Partial SBOpancreatic pseudocyst, likely chronic S/p CT image guided drainage Successful CT guided aspiration of approximately 250 cc of bilious appearing fluid from indeterminate fluid collection with the left mid hemiabdomen. Samples sent for amylase, lipase and bilirubin levels as well as culture analysis. 2. Following aspiration and near complete evacuation, contrast injection demonstrates opacification of adjacent afferent limbs suggesting that the indeterminate fluid collection with the left mid hemiabdomen may represent either a focally dilated patulous loop of the afferent limb versus a contained perforation as a result of afferent limb obstruction, presumably secondary to adhesions. Ultimately, pending results of submitted aspiration samples as well as the patient's response to today's aspiration, the patient may benefit from venting gastrostomy tube (potentially performed with CT guidance given lack of ability to performed gastric insufflation due to gastric bypass) as  indicated.  -Vitals per floor protocol - A.m. CMP/CBC - continue LFT trend - f/u Iron panel, per GI - Continue Zofranas neededfor nausea  - Con't Lovenox for DVT prophylaxis  - mobilize for bowel function - Scheduled Miralax    Elevated alkaline phosphatase ALP1,066> [id number],AST 29>64>118>106>42, ALT 55>61>71>80>61. Likely in the setting of known pancreatic pseudocystsand partial SBO that could be causing blockage in biliary system. - Monitor patient for constipation - miralax scheduled  Shortness of breath Patientendorses shortness of breath when walking. CXR 7/28, was significant for mild left basilar atelectasis. The left hemidiaphragm is elevated. Patient sounded clear on exam and was satting 94-99% on RA.  - cont to monitor   Recent diagnosis of UTI- stable Patient reports no pain w/ urination. WBC 7.5. Patient remains afebrile.No pain w/ urination.There does not appear to be signs of active infection at this time.UA was positive for nitrites, protein, moderate bilirubin. Urine had small Hgb, cloudy and orange; however patient is currently menstruating.  Hypokalemia- improved Mild Hyponatremia  K+ 3.6.NA 133 - Per GI keep K>4 and Mg>2 for Bowel function -AM CMP - Will continue to monitor  Normocytic anemiahistory of B12 deficiency anemias/p gastric bypass Am Hgb 8.8Last hemoglobin in chart on 07/20/2019 was 10.6.B12 261, Folate 8.9. -Transfusion threshold of 7 due to lack of CAD/PAD comorbidities -Follow-up morning CBC  Hyperglycemia AM BGL 111. HgbA1c 4.4  Borderline normal EKG Admission EKG showed NSR, borderline conduction delays, appears unchanged from previous.AM EKG was sinus tachy with QTc 499. There was signs of inferior and anterolateral infarct of undetermined age.  Hxpancreatitis  Heavy alcohol use In 2018, with hospitalization. Etiology of heavy alcohol use. She has since cut back, now drinking twice a week with  reported 3-4 drinks per episode. Drink of choice white wine or vodka. Last drink  2 weeks ago.Most recent [place]. Labs as above. - monitor CIWAs without ativan - Educate patient on alcohol and gastric ulcers  GERD Currently no complaints of heartburn. -Protonix 40 BID for 8 weeks  Disposition: Home   Subjective:  Pt laying in bed endorsing 7-8/10 pain at rest, and 9 when she coughs. She also reports becoming short of breath when she walks. Denies chest pain or palpitations. States her sister is coming today and she will help her walk.   Objective: Temp:  [99.5 F (37.5 C)-100.9 F (38.3 C)] 99.5 F (37.5 C) (08/01 1455) Pulse Rate:  [96-108] 105 (08/01 1455) Resp:  [18-20] 20 (08/01 1455) BP: (115-128)/(72-80) 128/72 (08/01 1455) SpO2:  [94 %-100 %] 94 % (08/01 1455) Physical Exam: General: Laying in bed, in moderate pain Cardiovascular: RRR, no murmurs, rubs, or gallops Respiratory: CTA bilaterally. No increased WOB Abdomen: Soft, non distended. LUQ abdominal pain. Normoactive bowel sounds  Extremities: distal pulses 2+ bilaterally   Laboratory: Recent Labs  Lab 02/11/20 0105 02/12/20 0243 02/13/20 0326  WBC 10.1 9.2 7.5  HGB 9.7* 8.8* 8.8*  HCT 32.7* 30.5* 30.3*  PLT 389 340 280   Recent Labs  Lab 02/11/20 0105 02/12/20 0243 02/13/20 0326  NA 139 136 133*  K 3.4* 3.3* 3.6  CL 109 108 108  CO2 20* 20* 19*  BUN 6 5* <5*  CREATININE 0.58 0.61 0.59  CALCIUM 9.9 9.4 8.8*  PROT 6.5 6.2* 5.6*  BILITOT 1.9* 2.8* 0.9  ALKPHOS 920* 813* 823*  ALT 71* 80* 61*  AST 118* 106* 42*  GLUCOSE 120* 111* 102*    Imaging/Diagnostic Tests:  CT Abdomen Pelvis W Contrast Result Date: 02/09/2020 CLINICAL DATA:  History of pancreatitis. Abdominal pain. Nausea and vomiting.Marland Kitchen IMPRESSION: Numerous chronic or subacute appearing pseudo cysts in the region of the pancreatic tail. The largest measures 10 cm in diameter. This is in the region of the ligament of Treitz and  appears to be exerting some mass effect upon the small bowel at that level, possibly with relative small bowel obstruction. Numerous other small pseudocysts in the region of the pancreatic tail and retroperitoneum. Previous surgery at the gastroesophageal junction region. Previous small bowel anastomotic surgery in the jejunal region. Distal small bowel appears normal. No suspicion of active/acute pancreatitis. Elevation of left hemidiaphragm. Basilar atelectasis left more than right.  DG Chest Port 1 View Result Date: 02/09/2020 CLINICAL DATA:  Shortness of breath for several days EXAM: PORTABLE CHEST 1 VIEW COMPARISON:  None. FINDINGS: Cardiac shadow is mildly prominent but accentuated by the portable technique. Minimal left basilar atelectasis is noted. The left hemidiaphragm is elevated. No bony abnormality is seen. IMPRESSION: Mild left basilar atelectasis. Electronically Signed   By: Inez Catalina M.D.   On: 02/09/2020 19:34   CT IMAGE GUIDED DRAINAGE BY PERCUTANEOUS CATHETER Result Date: 02/12/2020 INDICATION: History of gastric bypass, now with indeterminate fluid collection (potentially a pseudocyst versus a focally dilated loop of bowel versus a contained enteric perforation) within the left mid hemiabdomen resulting in obstruction of the afferent limb.   IMPRESSION: 1. Successful CT guided aspiration of approximately 250 cc of bilious appearing fluid from indeterminate fluid collection with the left mid hemiabdomen. Samples sent for amylase, lipase and bilirubin levels as well as culture analysis. 2. Following aspiration and near complete evacuation, contrast injection demonstrates opacification of adjacent afferent limbs suggesting that the indeterminate fluid collection with the left mid hemiabdomen may represent either a focally dilated patulous loop of the afferent  limb versus a contained perforation as a result of afferent limb obstruction, presumably secondary to adhesions. Ultimately, pending  results of submitted aspiration samples as well as the patient's response to today's aspiration, the patient may benefit from venting gastrostomy tube (potentially performed with CT guidance given lack of ability to performed gastric insufflation due to gastric bypass) as indicated.    Alexis Stanley, DO 02/13/2020, 6:10 PM PGY-1, [place] Intern pager: [phone], text pages welcome

## 2020-02-14 ENCOUNTER — Inpatient Hospital Stay (HOSPITAL_COMMUNITY): Payer: BC Managed Care – PPO

## 2020-02-14 ENCOUNTER — Encounter (HOSPITAL_COMMUNITY)
Admission: EM | Disposition: A | Discharge status: Home Health | Payer: Self-pay | Source: Ambulatory Visit | Attending: Family Medicine

## 2020-02-14 ENCOUNTER — Inpatient Hospital Stay (HOSPITAL_COMMUNITY): Payer: BC Managed Care – PPO | Admitting: Certified Registered"

## 2020-02-14 ENCOUNTER — Encounter (HOSPITAL_COMMUNITY): Payer: Self-pay | Admitting: Family Medicine

## 2020-02-14 DIAGNOSIS — R109 Unspecified abdominal pain: Secondary | ICD-10-CM

## 2020-02-14 DIAGNOSIS — R1032 Left lower quadrant pain: Secondary | ICD-10-CM

## 2020-02-14 HISTORY — PX: LYSIS OF ADHESION: SHX5961

## 2020-02-14 HISTORY — PX: LAPAROSCOPIC REVISION OF ROUXENY WITH UPPER ENDOSCOPY: SHX5923

## 2020-02-14 HISTORY — PX: APPLICATION OF WOUND VAC: SHX5189

## 2020-02-14 HISTORY — PX: GASTROSTOMY: SHX5249

## 2020-02-14 HISTORY — PX: LAPAROTOMY: SHX154

## 2020-02-14 HISTORY — PX: DEBRIDEMENT OF ABDOMINAL WALL ABSCESS: SHX6396

## 2020-02-14 LAB — CBC WITH DIFFERENTIAL/PLATELET
Abs Immature Granulocytes: 0.2 10*3/uL — ABNORMAL HIGH (ref 0.00–0.07)
Basophils Absolute: 0 10*3/uL (ref 0.0–0.1)
Basophils Relative: 0 %
Eosinophils Absolute: 0 10*3/uL (ref 0.0–0.5)
Eosinophils Relative: 0 %
HCT: 32.4 % — ABNORMAL LOW (ref 36.0–46.0)
Hemoglobin: 9.6 g/dL — ABNORMAL LOW (ref 12.0–15.0)
Immature Granulocytes: 2 %
Lymphocytes Relative: 13 %
Lymphs Abs: 1.7 10*3/uL (ref 0.7–4.0)
MCH: 26 pg (ref 26.0–34.0)
MCHC: 29.6 g/dL — ABNORMAL LOW (ref 30.0–36.0)
MCV: 87.8 fL (ref 80.0–100.0)
Monocytes Absolute: 1.1 10*3/uL — ABNORMAL HIGH (ref 0.1–1.0)
Monocytes Relative: 8 %
Neutro Abs: 10.4 10*3/uL — ABNORMAL HIGH (ref 1.7–7.7)
Neutrophils Relative %: 77 %
Platelets: 293 10*3/uL (ref 150–400)
RBC: 3.69 MIL/uL — ABNORMAL LOW (ref 3.87–5.11)
RDW: 20.3 % — ABNORMAL HIGH (ref 11.5–15.5)
WBC: 13.4 10*3/uL — ABNORMAL HIGH (ref 4.0–10.5)
nRBC: 0.2 % (ref 0.0–0.2)

## 2020-02-14 LAB — BASIC METABOLIC PANEL
Anion gap: 6 (ref 5–15)
BUN: 5 mg/dL — ABNORMAL LOW (ref 6–20)
CO2: 17 mmol/L — ABNORMAL LOW (ref 22–32)
Calcium: 8.4 mg/dL — ABNORMAL LOW (ref 8.9–10.3)
Chloride: 111 mmol/L (ref 98–111)
Creatinine, Ser: 0.62 mg/dL (ref 0.44–1.00)
GFR calc Af Amer: >60 mL/min (ref >60)
GFR calc non Af Amer: >60 mL/min (ref >60)
Glucose, Bld: 131 mg/dL — ABNORMAL HIGH (ref 70–99)
Potassium: 3.6 mmol/L (ref 3.5–5.1)
Sodium: 134 mmol/L — ABNORMAL LOW (ref 135–145)

## 2020-02-14 LAB — URINE CULTURE

## 2020-02-14 LAB — SURGICAL PCR SCREEN
MRSA, PCR: NEGATIVE
Staphylococcus aureus: NEGATIVE

## 2020-02-14 LAB — ABO/RH: ABO/RH(D): A POS

## 2020-02-14 SURGERY — LAPAROTOMY, EXPLORATORY
Anesthesia: General | Site: Abdomen

## 2020-02-14 MED ORDER — SUGAMMADEX SODIUM 200 MG/2ML IV SOLN
INTRAVENOUS | Status: DC | PRN
  Administered 2020-02-14: 25 mg via INTRAVENOUS
  Administered 2020-02-14: 250 mg via INTRAVENOUS
  Administered 2020-02-14: 75 mg via INTRAVENOUS
  Administered 2020-02-14: 100 mg via INTRAVENOUS
  Administered 2020-02-14: 50 mg via INTRAVENOUS

## 2020-02-14 MED ORDER — PANTOPRAZOLE SODIUM 40 MG IV SOLR
40.0000 mg | Freq: Two times a day (BID) | INTRAVENOUS | Status: DC
  Administered 2020-02-14 – 2020-02-16 (×6): 40 mg via INTRAVENOUS
  Filled 2020-02-14 (×6): qty 40

## 2020-02-14 MED ORDER — MIDAZOLAM HCL 2 MG/2ML IJ SOLN
INTRAMUSCULAR | Status: AC
  Filled 2020-02-14: qty 2

## 2020-02-14 MED ORDER — HEMOSTATIC AGENTS (NO CHARGE) OPTIME
TOPICAL | Status: DC | PRN
  Administered 2020-02-14: 1 via TOPICAL

## 2020-02-14 MED ORDER — MIDAZOLAM HCL 5 MG/5ML IJ SOLN
INTRAMUSCULAR | Status: DC | PRN
  Administered 2020-02-14 (×2): 1 mg via INTRAVENOUS

## 2020-02-14 MED ORDER — LIDOCAINE 2% (20 MG/ML) 5 ML SYRINGE
INTRAMUSCULAR | Status: AC
  Filled 2020-02-14: qty 5

## 2020-02-14 MED ORDER — ONDANSETRON HCL 4 MG/2ML IJ SOLN: 4.0000 mg | Freq: Once | INTRAMUSCULAR | Status: DC | PRN

## 2020-02-14 MED ORDER — SUCCINYLCHOLINE CHLORIDE 200 MG/10ML IV SOSY
PREFILLED_SYRINGE | INTRAVENOUS | Status: AC
  Filled 2020-02-14: qty 10

## 2020-02-14 MED ORDER — ONDANSETRON HCL 4 MG/2ML IJ SOLN
INTRAMUSCULAR | Status: DC | PRN
  Administered 2020-02-14 (×2): 4 mg via INTRAVENOUS

## 2020-02-14 MED ORDER — ACETAMINOPHEN 10 MG/ML IV SOLN
INTRAVENOUS | Status: DC | PRN
  Administered 2020-02-14: 1000 mg via INTRAVENOUS

## 2020-02-14 MED ORDER — ROCURONIUM BROMIDE 10 MG/ML (PF) SYRINGE
PREFILLED_SYRINGE | INTRAVENOUS | Status: AC
  Filled 2020-02-14: qty 10

## 2020-02-14 MED ORDER — PHENYLEPHRINE 40 MCG/ML (10ML) SYRINGE FOR IV PUSH (FOR BLOOD PRESSURE SUPPORT)
PREFILLED_SYRINGE | INTRAVENOUS | Status: AC
  Filled 2020-02-14: qty 10

## 2020-02-14 MED ORDER — ACETAMINOPHEN 10 MG/ML IV SOLN
1000.0000 mg | Freq: Four times a day (QID) | INTRAVENOUS | Status: AC
  Administered 2020-02-14 – 2020-02-15 (×4): 1000 mg via INTRAVENOUS
  Filled 2020-02-14 (×5): qty 100

## 2020-02-14 MED ORDER — ACETAMINOPHEN 10 MG/ML IV SOLN
INTRAVENOUS | Status: AC
  Filled 2020-02-14: qty 100

## 2020-02-14 MED ORDER — DEXAMETHASONE SODIUM PHOSPHATE 10 MG/ML IJ SOLN
INTRAMUSCULAR | Status: DC | PRN
  Administered 2020-02-14: 6 mg via INTRAVENOUS

## 2020-02-14 MED ORDER — OXYCODONE HCL 5 MG/5ML PO SOLN: 5.0000 mg | Freq: Once | ORAL | Status: DC | PRN

## 2020-02-14 MED ORDER — 0.9 % SODIUM CHLORIDE (POUR BTL) OPTIME
TOPICAL | Status: DC | PRN
  Administered 2020-02-14 (×2): 1000 mL

## 2020-02-14 MED ORDER — SUGAMMADEX SODIUM 500 MG/5ML IV SOLN
INTRAVENOUS | Status: AC
  Filled 2020-02-14: qty 5

## 2020-02-14 MED ORDER — LIDOCAINE 2% (20 MG/ML) 5 ML SYRINGE
INTRAMUSCULAR | Status: DC | PRN
  Administered 2020-02-14: 100 mg via INTRAVENOUS

## 2020-02-14 MED ORDER — FENTANYL CITRATE (PF) 100 MCG/2ML IJ SOLN
25.0000 ug | INTRAMUSCULAR | Status: DC | PRN
  Administered 2020-02-14 (×2): 25 ug via INTRAVENOUS

## 2020-02-14 MED ORDER — PROPOFOL 10 MG/ML IV BOLUS
INTRAVENOUS | Status: DC | PRN
  Administered 2020-02-14: 200 mg via INTRAVENOUS

## 2020-02-14 MED ORDER — SODIUM CHLORIDE 0.9 % IV SOLN: INTRAVENOUS | Status: DC

## 2020-02-14 MED ORDER — LACTATED RINGERS IV SOLN: INTRAVENOUS | Status: DC

## 2020-02-14 MED ORDER — ONDANSETRON HCL 4 MG/2ML IJ SOLN
INTRAMUSCULAR | Status: AC
  Filled 2020-02-14: qty 2

## 2020-02-14 MED ORDER — ENOXAPARIN SODIUM 60 MG/0.6ML ~~LOC~~ SOLN
60.0000 mg | SUBCUTANEOUS | Status: DC
  Administered 2020-02-15 – 2020-02-16 (×2): 60 mg via SUBCUTANEOUS
  Filled 2020-02-14 (×2): qty 0.6

## 2020-02-14 MED ORDER — METRONIDAZOLE IN NACL 5-0.79 MG/ML-% IV SOLN
500.0000 mg | Freq: Three times a day (TID) | INTRAVENOUS | Status: AC
  Administered 2020-02-14 – 2020-02-19 (×16): 500 mg via INTRAVENOUS
  Filled 2020-02-14 (×16): qty 100

## 2020-02-14 MED ORDER — ROCURONIUM BROMIDE 10 MG/ML (PF) SYRINGE
PREFILLED_SYRINGE | INTRAVENOUS | Status: DC | PRN
  Administered 2020-02-14: 20 mg via INTRAVENOUS
  Administered 2020-02-14: 10 mg via INTRAVENOUS
  Administered 2020-02-14: 30 mg via INTRAVENOUS
  Administered 2020-02-14: 20 mg via INTRAVENOUS
  Administered 2020-02-14: 50 mg via INTRAVENOUS
  Administered 2020-02-14: 10 mg via INTRAVENOUS

## 2020-02-14 MED ORDER — FENTANYL CITRATE (PF) 250 MCG/5ML IJ SOLN
INTRAMUSCULAR | Status: AC
  Filled 2020-02-14: qty 5

## 2020-02-14 MED ORDER — DIPHENHYDRAMINE HCL 50 MG/ML IJ SOLN
INTRAMUSCULAR | Status: DC | PRN
  Administered 2020-02-14: 25 mg via INTRAVENOUS

## 2020-02-14 MED ORDER — CHLORHEXIDINE GLUCONATE 0.12 % MT SOLN
15.0000 mL | Freq: Once | OROMUCOSAL | Status: DC
  Filled 2020-02-14: qty 15

## 2020-02-14 MED ORDER — MORPHINE SULFATE (PF) 2 MG/ML IV SOLN
1.0000 mg | INTRAVENOUS | Status: DC | PRN
  Administered 2020-02-14 – 2020-02-18 (×22): 2 mg via INTRAVENOUS
  Filled 2020-02-14 (×24): qty 1

## 2020-02-14 MED ORDER — FENTANYL CITRATE (PF) 100 MCG/2ML IJ SOLN
INTRAMUSCULAR | Status: DC | PRN
  Administered 2020-02-14 (×2): 50 ug via INTRAVENOUS
  Administered 2020-02-14: 150 ug via INTRAVENOUS
  Administered 2020-02-14: 50 ug via INTRAVENOUS
  Administered 2020-02-14: 100 ug via INTRAVENOUS
  Administered 2020-02-14: 150 ug via INTRAVENOUS

## 2020-02-14 MED ORDER — PROPOFOL 10 MG/ML IV BOLUS
INTRAVENOUS | Status: AC
  Filled 2020-02-14: qty 20

## 2020-02-14 MED ORDER — LACTATED RINGERS IV SOLN: INTRAVENOUS | Status: DC | PRN

## 2020-02-14 MED ORDER — MORPHINE SULFATE (PF) 2 MG/ML IV SOLN
2.0000 mg | Freq: Once | INTRAVENOUS | Status: AC
  Administered 2020-02-14: 2 mg via INTRAVENOUS
  Filled 2020-02-14: qty 1

## 2020-02-14 MED ORDER — METRONIDAZOLE IN NACL 5-0.79 MG/ML-% IV SOLN
500.0000 mg | Freq: Once | INTRAVENOUS | Status: AC
  Administered 2020-02-14: 500 mg via INTRAVENOUS
  Filled 2020-02-14: qty 100

## 2020-02-14 MED ORDER — BUPIVACAINE HCL (PF) 0.25 % IJ SOLN
INTRAMUSCULAR | Status: AC
  Filled 2020-02-14: qty 30

## 2020-02-14 MED ORDER — SUCCINYLCHOLINE CHLORIDE 200 MG/10ML IV SOSY
PREFILLED_SYRINGE | INTRAVENOUS | Status: DC | PRN
  Administered 2020-02-14: 120 mg via INTRAVENOUS

## 2020-02-14 MED ORDER — FENTANYL CITRATE (PF) 100 MCG/2ML IJ SOLN
INTRAMUSCULAR | Status: AC
  Administered 2020-02-14: 50 ug via INTRAVENOUS
  Filled 2020-02-14: qty 2

## 2020-02-14 MED ORDER — METHOCARBAMOL 1000 MG/10ML IJ SOLN
500.0000 mg | Freq: Three times a day (TID) | INTRAVENOUS | Status: DC | PRN
  Administered 2020-02-14: 500 mg via INTRAVENOUS
  Filled 2020-02-14 (×2): qty 5

## 2020-02-14 MED ORDER — BUPIVACAINE HCL (PF) 0.25 % IJ SOLN: INTRAMUSCULAR | Status: DC | PRN

## 2020-02-14 MED ORDER — BUPIVACAINE LIPOSOME 1.3 % IJ SUSP
INTRAMUSCULAR | Status: DC | PRN
  Administered 2020-02-14: 20 mL

## 2020-02-14 MED ORDER — ALBUMIN HUMAN 5 % IV SOLN
INTRAVENOUS | Status: DC | PRN
Start: 2020-02-14 — End: 2020-02-14

## 2020-02-14 MED ORDER — BUPIVACAINE LIPOSOME 1.3 % IJ SUSP
20.0000 mL | Freq: Once | INTRAMUSCULAR | Status: DC
  Filled 2020-02-14: qty 20

## 2020-02-14 MED ORDER — SODIUM CHLORIDE 0.9 % IV SOLN
2.0000 g | Freq: Every day | INTRAVENOUS | Status: AC
  Administered 2020-02-14 – 2020-02-19 (×7): 2 g via INTRAVENOUS
  Filled 2020-02-14 (×4): qty 20
  Filled 2020-02-14 (×2): qty 2
  Filled 2020-02-14: qty 20
  Filled 2020-02-14: qty 2

## 2020-02-14 MED ORDER — MORPHINE SULFATE (PF) 2 MG/ML IV SOLN
INTRAVENOUS | Status: AC
  Administered 2020-02-14: 2 mg via INTRAVENOUS
  Filled 2020-02-14: qty 1

## 2020-02-14 MED ORDER — DEXAMETHASONE SODIUM PHOSPHATE 10 MG/ML IJ SOLN
INTRAMUSCULAR | Status: AC
  Filled 2020-02-14: qty 1

## 2020-02-14 MED ORDER — OXYCODONE HCL 5 MG PO TABS: 5.0000 mg | ORAL_TABLET | Freq: Once | ORAL | Status: DC | PRN

## 2020-02-14 MED ORDER — DIPHENHYDRAMINE HCL 50 MG/ML IJ SOLN
INTRAMUSCULAR | Status: AC
  Filled 2020-02-14: qty 1

## 2020-02-14 MED ORDER — DEXMEDETOMIDINE (PRECEDEX) IN NS 20 MCG/5ML (4 MCG/ML) IV SYRINGE
PREFILLED_SYRINGE | INTRAVENOUS | Status: DC | PRN
  Administered 2020-02-14: 20 ug via INTRAVENOUS

## 2020-02-14 SURGICAL SUPPLY — 68 items
ADH SKN CLS APL DERMABOND .7 (GAUZE/BANDAGES/DRESSINGS)
APL PRP STRL LF DISP 70% ISPRP (MISCELLANEOUS) ×2
BAG DRN RND TRDRP ANRFLXCHMBR (UROLOGICAL SUPPLIES) ×2
BAG URINE DRAIN 2000ML AR STRL (UROLOGICAL SUPPLIES) ×4 IMPLANT
BIOPATCH RED 1 DISK 7.0 (GAUZE/BANDAGES/DRESSINGS) ×3 IMPLANT
BIOPATCH RED 1IN DISK 7.0MM (GAUZE/BANDAGES/DRESSINGS) ×1
BLADE CLIPPER SURG (BLADE) IMPLANT
CANISTER SUCT 3000ML PPV (MISCELLANEOUS) ×12 IMPLANT
CANISTER WOUND CARE 500ML ATS (WOUND CARE) ×4 IMPLANT
CHLORAPREP W/TINT 26 (MISCELLANEOUS) ×4 IMPLANT
COVER SURGICAL LIGHT HANDLE (MISCELLANEOUS) ×4 IMPLANT
CUTTER ECHEON FLEX ENDO 45 340 (ENDOMECHANICALS) ×4 IMPLANT
DERMABOND ADVANCED (GAUZE/BANDAGES/DRESSINGS)
DERMABOND ADVANCED .7 DNX12 (GAUZE/BANDAGES/DRESSINGS) IMPLANT
DRAIN CHANNEL 19F RND (DRAIN) ×4 IMPLANT
DRAPE LAPAROSCOPIC ABDOMINAL (DRAPES) ×4 IMPLANT
DRAPE WARM FLUID 44X44 (DRAPES) ×4 IMPLANT
DRSG OPSITE POSTOP 4X10 (GAUZE/BANDAGES/DRESSINGS) IMPLANT
DRSG OPSITE POSTOP 4X8 (GAUZE/BANDAGES/DRESSINGS) IMPLANT
DRSG TEGADERM 4X4.75 (GAUZE/BANDAGES/DRESSINGS) ×4 IMPLANT
DRSG VAC ATS LRG SENSATRAC (GAUZE/BANDAGES/DRESSINGS) ×4 IMPLANT
ELECT CAUTERY BLADE 6.4 (BLADE) ×4 IMPLANT
ELECT REM PT RETURN 9FT ADLT (ELECTROSURGICAL) ×4
ELECTRODE REM PT RTRN 9FT ADLT (ELECTROSURGICAL) ×2 IMPLANT
EVACUATOR SILICONE 100CC (DRAIN) ×4 IMPLANT
GLOVE BIO SURGEON STRL SZ8 (GLOVE) ×4 IMPLANT
GLOVE BIOGEL M STRL SZ7.5 (GLOVE) ×4 IMPLANT
GLOVE BIOGEL PI IND STRL 8 (GLOVE) ×2 IMPLANT
GLOVE BIOGEL PI INDICATOR 8 (GLOVE) ×2
GLOVE INDICATOR 8.0 STRL GRN (GLOVE) ×8 IMPLANT
GLOVE SURG SYN 7.5  E (GLOVE) ×2
GLOVE SURG SYN 7.5 E (GLOVE) ×2 IMPLANT
GOWN STRL REUS W/ TWL LRG LVL3 (GOWN DISPOSABLE) ×6 IMPLANT
GOWN STRL REUS W/ TWL XL LVL3 (GOWN DISPOSABLE) ×2 IMPLANT
GOWN STRL REUS W/TWL 2XL LVL3 (GOWN DISPOSABLE) ×4 IMPLANT
GOWN STRL REUS W/TWL LRG LVL3 (GOWN DISPOSABLE) ×12
GOWN STRL REUS W/TWL XL LVL3 (GOWN DISPOSABLE) ×4
HANDLE SUCTION POOLE (INSTRUMENTS) ×4 IMPLANT
HEMOSTAT SNOW SURGICEL 2X4 (HEMOSTASIS) ×4 IMPLANT
KIT BASIN OR (CUSTOM PROCEDURE TRAY) ×4 IMPLANT
KIT TURNOVER KIT B (KITS) ×4 IMPLANT
LIGASURE IMPACT 36 18CM CVD LR (INSTRUMENTS) ×4 IMPLANT
NS IRRIG 1000ML POUR BTL (IV SOLUTION) ×8 IMPLANT
PACK GENERAL/GYN (CUSTOM PROCEDURE TRAY) ×4 IMPLANT
PENCIL SMOKE EVACUATOR (MISCELLANEOUS) ×4 IMPLANT
SET TUBE SMOKE EVAC HIGH FLOW (TUBING) ×4 IMPLANT
SPECIMEN JAR LARGE (MISCELLANEOUS) IMPLANT
SPONGE LAP 18X18 RF (DISPOSABLE) ×16 IMPLANT
STAPLE RELOAD 45MM BLUE (STAPLE) ×12 IMPLANT
STAPLER VISISTAT 35W (STAPLE) ×4 IMPLANT
SUCTION POOLE HANDLE (INSTRUMENTS) ×8
SUT ETHILON 2 0 FS 18 (SUTURE) ×8 IMPLANT
SUT MNCRL AB 4-0 PS2 18 (SUTURE) IMPLANT
SUT PDS AB 1 TP1 96 (SUTURE) ×8 IMPLANT
SUT SILK 2 0 SH CR/8 (SUTURE) ×4 IMPLANT
SUT SILK 2 0 TIES 10X30 (SUTURE) ×4 IMPLANT
SUT SILK 3 0 SH CR/8 (SUTURE) ×12 IMPLANT
SUT SILK 3 0 TIES 10X30 (SUTURE) ×4 IMPLANT
SUT VIC AB 3-0 SH 18 (SUTURE) ×8 IMPLANT
SUT VIC AB 3-0 SH 8-18 (SUTURE) ×8 IMPLANT
SYR BULB EAR ULCER 3OZ GRN STR (SYRINGE) ×4 IMPLANT
TOWEL GREEN STERILE (TOWEL DISPOSABLE) ×4 IMPLANT
TOWEL GREEN STERILE FF (TOWEL DISPOSABLE) ×4 IMPLANT
TRAY FOLEY MTR SLVR 16FR STAT (SET/KITS/TRAYS/PACK) IMPLANT
TRAY LAPAROSCOPIC MC (CUSTOM PROCEDURE TRAY) ×4 IMPLANT
TROCAR XCEL NON-BLD 5MMX100MML (ENDOMECHANICALS) ×4 IMPLANT
TUBE GASTROSTOMY 18F (CATHETERS) ×4 IMPLANT
YANKAUER SUCT BULB TIP NO VENT (SUCTIONS) IMPLANT

## 2020-02-14 NOTE — Op Note (Signed)
Alexis Avila, [place] MEDICAL RECORD FS:[id number] ACCOUNT [id number] DATE OF BIRTH:27-Mar-1981 FACILITY: [facility] LOCATION: [id number] PHYSICIAN:Alexis Avila Ronnie Derby, MD  OPERATIVE REPORT  DATE OF PROCEDURE:  02/14/2020  PREOPERATIVE DIAGNOSES:   1.  Bowel obstruction and possible perforation. 2.  History of open gastric bypass.  POSTOPERATIVE DIAGNOSES: 1.  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 2.  Left abdominal/retroperitoneal hematoma/abscess with possible perforation. 3.  History of open retrocolic Roux-en-Y gastric bypass. 4.  Serosal tear of Roux limb  PROCEDURE: 1.  Exploratory laparotomy. 2.  Lysis of adhesions for more than 2 hours. 3.  Drainage of intra-abdominal/retroperitoneal LUQ hematoma/abscess. 4.  Partial resection of Roux limb. 5.  Insertion of 18-French gastrotomy tube in the gastric remnant.  SURGEON:  Alexis Avila. Alexis Pulling, MD  ASSISTANT:   1.  Dr. Alphonsa Overall who was there for the majority of the procedure. (assistants were needed due to the extreme dense adhesions throughout abdomen and assist in mobilization of tissue) 2.  Alexis Avila.  ANESTHESIA:  General.  ESTIMATED BLOOD LOSS:  500 mL.   SPECIMENS:  Section of small bowel, local Exparel.  INDICATIONS:  The patient is a 39 year old female who underwent an open gastric bypass many years ago around 2002 in [place], who was admitted late last week with abdominal pain, nausea and vomiting.  It primarily started with nausea and vomiting of  phlegm.  She had some epigastric pain as well.  She had some elevated alkaline phosphatase and a mildly elevated lipase.  A CT scan was read as potentially chronic peripancreatic pseudocyst and dilated duodenum to the ligament of Treitz region, as well  as some distal small bowel.  Her gastric remnant was also dilated.  She was admitted and underwent GI medicine consultation and surgical consultation.  She also underwent an upper endoscopy which showed  some superficial marginal ulcers.  She was started  on b.i.d. Protonix as well as Carafate.  Because she had ongoing pain and there was concern for pancreatic pseudocyst, she underwent aspiration of what appeared to be a pancreatic pseudocyst fluid collection on 07/31.  They were able to aspirate 250 mL of bilious-appearing  fluid.  They then injected the sheath catheter and it opacified the biliopancreatic limb.  There was passage of contrast into the adjacent loops of the limb of the bypass, the biliary pancreatic limb.  There was a small amount of  extraluminal contrast.  On Sunday night/Monday morning, she became febrile and tachycardic and her white blood cell count increased.  I reviewed her imaging and was concerned that she did not have a large pancreatic pseudocyst and that was in  fact her dilated biliary pancreatic limb, which possibly had an obstruction at her jejunojejunostomy.  The biliopancreatic limb was dilated all the way up to her gastric remnant and down to the jejunojejunostomy.  I was concerned that she had a leak of  enteric contents or bilious content since she had had a percutaneous aspiration in that area.  Because of that and her change in clinical exam and vital signs, I recommended a diagnostic laparoscopy with a high probability of conversion to open.  We  talked about there may be numerous adhesions in her abdomen from her open bypass, potentially resulting in enterotomy or serosal tears, potential need for bowel resection, potential need for drain and/or a gastric tube in her gastric remnant.  We did  discuss that there was a possibility we may not be able to find the area  of perforation due to her anatomy and adhesions and may just have to widely drain the area.  We discussed risk of hernia, blood clot formation, perioperative cardiac and pulmonary  events, prolonged hospitalization and abscess formation.  She had been started on broad spectrum IV antibiotics  overnight.  DESCRIPTION OF PROCEDURE:  She was taken to OR #[facility] at [facility], placed supine on the operating room table.  Her right arm was tucked.  Sequential compression devices had been placed.  She had had a laparoscopic cholecystectomy that they were  able to do laparoscopic cholecystectomy in 2013 she told me, but she said that they encountered adhesions on the left side, but they were able to proceed laparoscopically.  I felt we could at least perform a laparoscopy to see how bad her adhesions were.   A surgical timeout had been performed.  Her abdomen had been prepped and draped in the usual standard surgical fashion.  A small incision was made in the right subcostal margin just below her right edge of the rib cage.  Then, using a 30-degree mm  laparoscope with a 5 mm trocar, I advanced it through all layers of the abdominal wall and entered, just went in through the peritoneum . Pneumoperitoneum was established.  I was able to visualize the intraabdominal cavity, but there were dense  adhesions.  There was also some bleeding, so therefore, I decided to convert to an open procedure.  She had an old scar in her upper midline that had a somewhat keloid appearance to it.  I elliptically excised that sharply with a scalpel, then divided  the subcutaneous tissue with electrocautery.  The fascia was encountered, along with her old fascial sutures.  Kochers were placed on the fascia and I entered it.  I was able to get my finger underneath the fascia and continued to open it toward the  umbilicus.  I bluntly took down some omental adhesions with my finger and removed old fascial sutures as I came across.  I then started taking down the omental adhesions to the anterior abdominal wall, which took approximately 30 minutes.  At this point,  Dr. Lucia Gaskins had joined me in the OR.  I inspected the right upper quadrant where the trocar had been placed.  There was a small violation of the hepatic capsule in  the right lobe of the liver.  This was cauterized and a piece of surgical SNoW was placed  on it.  The omentum was adhered all the way out to the lateral edge of the left abdominal wall.  I ended up extending the incision both cephalad as well as further below the umbilicus eventually.  We were able to place a Bookwalter retractor.  We then  spent another hour and a half lysing adhesions.  There were dense interloop adhesions, as well as soft, filmy interloop adhesions.  We had eventually identified the lay of her anatomy.  She had a retrocolic Roux-en-Y gastric bypass.  The section of the  Roux limb that came out infracolic or through the transverse colon mesentery was densely adhered to the biliopancreatic limb.  In freeing that, a long serosal tear was made to the Roux limb about 10 cm from where it came out through the transverse colon  mesentery.  There was significant dilation of her biliopancreatic limb.  We identified the common channel and the common chanel downstream.  We ran it all the way to her TI and that was of normal caliber.  The biliopancreatic limb was just extremely dilated and  there appeared to be a firm mass if you will in the left midabdomen,  in the retroperitoneum.  At this point, we decided just because of the dense adhesions that we probably should make an enterotomy in the gastric remnant in order to decompress  the biliary pancreatic limb to perhaps make it easier to mobilize the biliopancreatic limb.  We took down omentum off of the top of the gastric remnant, identified the Roux limb coming retrocolic into the upper abdomen over the gastric remnant so she had  a retrocolic Roux limb.  We identified the gastric remnant which was quite distended.  A 2-0 silk pursestring suture was placed.  I then made a decompressive gastrotomy.  There was 1300 mL of bilious contents that came out.  We then went below  and into the left lower quadrant.  There was still this firm mass if you will  in the left midabdomen, almost in the retroperitoneum near where the kidney would be.  I was able to finger fracture and get into this area and it was clearly a large hematoma  that was sort of serosanguineous, but dark.  There was no frank bile in this area.  I did not feel that it was intraluminal.  It felt like I was in an abscess cavity per se, but there was no frank pus per se.  At this point, I had mobilized the  biliopancreatic limb and we had come across a dense adhesive band that appeared to be the source of obstruction.  It was decompressed downstream from this and of normal caliber.  The biliopancreatic adhesive band was probably about 5 cm just proximal to  the jejunojejunostomy.  Therefore, since this section was decompressed, I felt we had found the source of obstruction.  Both my partner and I looked carefully at the more proximal biliopancreatic limb that was near where this fluid-filled cavity was.  We  felt that if we tried to mobilize any more that there would be a significant risk of devascularization and/or significant enterotomy to her BP limb.  Since there was no frank drainage of bilious material in this location, we felt that further  mobilization would be high risk, potentially resulting in a severe injury, making it very challenging to reconstruct that area.  So therefore, we decided to leave a drain in that location since this would be essentially a nonfunctional limb in the sense  that it would not have enteric contents going through it and we would leave a G-tube in her remnant where it could be decompressed and if there was in fact even a small fistula or hole, it could potentially seal with time.  We irrigated the abdomen with  2 L of saline.  A small incision was made in the left lateral abdominal wall and a 19 round Blake drain was brought into the abdomen and placed in the left pericolic gutter, extending into that fluid-filled cavity.  We then went about looking at the  Roux  limb.  As stated before, there was probably about a 5 cm long serosal tear.  It was not full thickness.  However, it was on the lateral aspect of the bowel wall near the mesenteric side.  We initially placed some 3-0 Vicryl sutures, but it ended up  resulting in the area being folded onto itself.  We were concerned for the potential stricture or blockage due to the way that it was closed, but  there was really no other way to close it, so therefore we took out those sutures, then I decided to do a  limited resection of her Roux limb.  There was enough mobilization/length proximally as well as distally.  Enterotomies were made with Bovie.  Then, I placed an Echelon 45 mm endomechanical GIA stapler through each of the enterotomies and fired to  create a common channel.  The bowel on either side was excised with another fire of the surgical stapler.  The common enterotomy was then closed with multiple interrupted 3-0 silk sutures.  The staple lines on each side were then imbricated with  interrupted 3-0 silk sutures.  There was not a mesenteric defect that needed to be closed.  The anastomosis was patent.  At this point, Dr. Brantley Stage had joined me in the operating room for the reinforcement of the end staple lines with silk suture.  We  then went up top and made a secondary pursestring suture with 2-0 silk suture in the gastric remnant.  I then brought an 18-French gastrotomy tube in through the left upper quadrant/abdominal wall.  We tested the balloon and made sure it worked.  The  gastrotomy tube was advanced through the gastrotomy that had been placed.  The 2 pursestring sutures were tied down and the balloon was insufflated.  We were able to bring the gastric remnant up to the abdominal wall and I placed 3 Stamm sutures of 2-0  silk suture between the peritoneum and the gastric remnant near the G-tube, ensuring that the suture did not go full thickness and hit the balloon and the feeding tube.  We then  closed the fascia with a running #1 looped PDS, one from above, one from  below, and tied centrally.  I then placed a wound VAC in typical fashion.  That was more than 10 square centimeters and secured it with plastic covers until we had a good seal and there was no air leak.  The G-tube had been secured to the skin with 2  nylon sutures and the surgical drain had been previously secured to the skin with a 2-0 nylon.  The trocar site had been closed with a skin staple.  The patient was extubated and taken to the recovery room in stable condition.  She tolerated the  procedure well.  All counts were correct x2.  VN/NUANCE  D:02/14/2020 T:02/14/2020 JOB:[id number]

## 2020-02-14 NOTE — Brief Op Note (Addendum)
02/14/2020  4:32 PM  PATIENT:  Alexis Avila  39 y.o. female  PRE-OPERATIVE DIAGNOSIS:  Bowel Obstruction possible perferation. History of open gastric Bypass.  POST-OPERATIVE DIAGNOSIS:  Obstruction of biliopancreatic limb due to dense adhesion, Left abdominal hematoma/abscess, possible perferation. History of open retrocolic antegastric roux en y gastric Bypass, serosal tear of roux limb  PROCEDURE:  Procedure(s): EXPLORATORY LAPAROTOMY (N/A) INSERTION OF GASTROSTOMY TUBE into Gastric Remnant  (N/A) LYSIS OF ADHESION >2 HOURS (N/A) DRAINAGE OF INTRAABDOMINAL ABSCESS (N/A) APPLICATION OF WOUND VAC (N/A) PARTIAL RESECTION OF ROUX LIMB (N/A)  SURGEON:  Surgeon(s) and Role:    Greer Pickerel, MD - Primary    * Cornett, Marcello Moores, MD - Assisting    * Alphonsa Overall, MD - Assisting (primary assistant)  PHYSICIAN ASSISTANT:   ASSISTANTS: Drs Lucia Gaskins and Cornett   ANESTHESIA:   general  EBL:  500 mL   BLOOD ADMINISTERED:none  DRAINS: Urinary Catheter (Foley)   LOCAL MEDICATIONS USED:  OTHER exparel  SPECIMEN:  Source of Specimen:  section of small bowel  DISPOSITION OF SPECIMEN:  PATHOLOGY  COUNTS:  YES  TOURNIQUET:  * No tourniquets in log *  DICTATION: Viviann Spare Dictation [phone]  PLAN OF CARE: Admit to inpatient   PATIENT DISPOSITION:  PACU - hemodynamically stable.   Delay start of Pharmacological VTE agent (>24hrs) due to surgical blood loss or risk of bleeding: no  Leighton Ruff. [name], MD, FACS General, Bariatric, & Minimally Invasive Surgery [facility], [place]

## 2020-02-14 NOTE — Transfer of Care (Signed)
Immediate Anesthesia Transfer of Care Note  Patient: Alexis Avila  Procedure(s) Performed: EXPLORATORY LAPAROTOMY (N/A Abdomen) INSERTION OF GASTROSTOMY TUBE (N/A Abdomen) LYSIS OF ADHESION >2 HOURS (N/A Abdomen) DRAINAGE OF INTRAABDOMINAL ABSCESS (N/A Abdomen) APPLICATION OF WOUND VAC (N/A Abdomen) PARTIAL RESECTION OF ROUX LIMB (N/A Abdomen)  Patient Location: PACU  Anesthesia Type:General  Level of Consciousness: awake, alert , oriented and patient cooperative  Airway & Oxygen Therapy: Patient Spontanous Breathing  Post-op Assessment: Report given to RN and Post -op Vital signs reviewed and stable  Post vital signs: Reviewed and stable  Last Vitals:  Vitals Value Taken Time  BP 130/76 02/14/20 1622  Temp    Pulse 123 02/14/20 1625  Resp 31 02/14/20 1625  SpO2 94 % 02/14/20 1625  Vitals shown include unvalidated device data.  Last Pain:  Vitals:   02/14/20 1000  TempSrc: Oral  PainSc:       Patients Stated Pain Goal: 2 (41/03/01 3143)  Complications: No complications documented.

## 2020-02-14 NOTE — Progress Notes (Signed)
Received page from nurse stating that patients boyfriend would like to speak with a doctor regarding an update on patient. I went to see the patient along with Dr. Alba Cory. He was primarily wondering why patients face was swollen and frustrated that he had not heard from the surgeon regarding her recent procedure. We explained that it is possible the swelling could be from fluids during the surgery. We further explained that we were not involved in the surgery directly, that we are the primary team managing and coordinating care. He voiced understanding. He did not have any further questions for [place] but would still like to speak with the surgeon.

## 2020-02-14 NOTE — Anesthesia Postprocedure Evaluation (Signed)
Anesthesia Post Note  Patient: Alexis Avila  Procedure(s) Performed: EXPLORATORY LAPAROTOMY (N/A Abdomen) INSERTION OF GASTROSTOMY TUBE (N/A Abdomen) LYSIS OF ADHESION >2 HOURS (N/A Abdomen) DRAINAGE OF INTRAABDOMINAL ABSCESS (N/A Abdomen) APPLICATION OF WOUND VAC (N/A Abdomen) PARTIAL RESECTION OF ROUX LIMB (N/A Abdomen)     Patient location during evaluation: PACU Anesthesia Type: General Level of consciousness: awake and alert Pain management: pain level controlled Vital Signs Assessment: post-procedure vital signs reviewed and stable Respiratory status: spontaneous breathing, nonlabored ventilation, respiratory function stable and patient connected to nasal cannula oxygen Cardiovascular status: blood pressure returned to baseline and stable Postop Assessment: no apparent nausea or vomiting Anesthetic complications: no   No complications documented.  Last Vitals:  Vitals:   02/14/20 1820 02/14/20 1856  BP: 113/68 112/73  Pulse:  (!) 118  Resp:  (!) 21  Temp: 36.9 C 36.9 C  SpO2:  95%    Last Pain:  Vitals:   02/14/20 1955  TempSrc:   PainSc: 8                  Melrose Kearse DAVID

## 2020-02-14 NOTE — Progress Notes (Signed)
RN attempted to update pt's boyfriend, Izell [place], via telephone, per pt's request. Unsuccessful at this time.

## 2020-02-14 NOTE — Progress Notes (Signed)
CALL PAGER [phone] for any questions or notifications regarding this patient  FMTS Attending Note: Alexis Mcmurray MD Received info from surgery who we called this morning as patient was febrile overnight. Surgery thinks small bowel may have been perfed during IR. In thenight when she spiked fever we drew blood cx, urine cx and started CTX and flagyl in middle of night. Appreciate surgery quick re-eval this morning. Patient appears non-toxic at this time.

## 2020-02-14 NOTE — Progress Notes (Addendum)
Family Medicine Teaching Service Daily Progress Note Intern Pager: [phone]  Patient name: Alexis Avila Medical record number: [id number] Date of birth: 12-13-1980 Age: 39 y.o. Gender: female  Primary Care Provider: [name], MD Consultants: GI and surgery  Code Status: Full  Pt Overview and Major Events to Date:  Admitted 7/28  Assessment and Plan: Alexis Boltonis a 39 y.o.femalepresenting with 5-day history epigastric abdominal pain, found to havepartialSBO  Apparently secondary to  pancreatic pseudocyst.Also found to have marginal ulcer on EGD. PMH is significant forGERD, pancreatitis and prior Roux n Y surgery for besity.   Sepsis, likely source bowel with possible perforation- acute, worsening Patient meets criteria for sepsis now with tachycardia, fever, and a source. Patient's bowel was lpossibly perforated with IR aspiration of what was believed to be a pseudocyst per GI and surgery; area aspirated  now believed to potentially  be small bowel, per GI and surgery notes and discussion. Patient was started on Metronidazole and Ceftriaxone overnight due to fever, tachycardia, and tachypnea. Patient was placed on ceftriaxone rather than zosyn due to concern for cross-reaction with PCN allergy. - patient upgraded to Progressive care - Continue metronidazole and ceftriaxone - Going to surgery scheduled 12:10 -appreciate surgery quick response for re-evaluation this AM.  Gastric ulcers- acute, improving EGD showed large marginal ulcer. Biopsies of ulcer returned:Inflammation but negative for H. Pylori. -ContinueProtonix 40 BID for 8 weeks  -ContinueSucralfate 1g ac/qh  - per surgery, NPO   Small bowel loop, acute- worsening S/p drainage of "pseudocyst".  Aspiration revealed bilious output.  Initially thought to be large pancreatic pseudocyst; Dr [name] from bariatrics evaluated patient and believes it to be small loop of small bowel instead that could potentially  need a gtube. Patient will go to surgery today.  Was febrile overnight. Patient continues to have left upper quadrant pain.  She reports it feels "numb".  Overnight patient reported emesis after drinking ginger ale. Patient endorses daily BM.  Will go to surgery w/ Dr. [name], today. - Dr. [name] surgery f/u post-op -Vitals per floor protocol - A.m. CMP/CBC - continue LFT trend - f/u Iron panel, per GI - Continue Zofranas neededfor nausea  - Con't Lovenox for DVT prophylaxis, but hold per surgery for procedure today - mobilize for bowel function - Scheduled Miralax   -Per GI recommendation Keep K > 4 and Mg > 2 for bowel function - Continue abx: ceftriaxone day #1, Metronidazole day #1 - Continue Norco 7.5 scheduled   Elevated alkaline phosphatase ALP1,066> 920>813>823,AST 29>64>118>106>42, ALT 55>61>71>80>61. Patient endorses daily BM. - miralax scheduled  Shortness of breath- acute, worsening Patientendorses shortness of breath when walking. CXR7/28,was significant for mild left basilar atelectasis.  There is decreased air movement on the lower left lobe. - cont to monitor. Symptoms are likely related to increasing abdominal pain and "splinting" of deep inspiration. No cough. No SOB at rest. -Consider repeat CXR post-op if no improvement or if clinical exam changes  Recent diagnosis of UTI- resolved Patient reports no pain w/ urination. WBC 7.5. Patient remains afebrile.No pain w/ urination.There does not appear to be signs of active infection at this time.UA at admission was positive for nitrites, protein, moderate bilirubin. Urine at admission had small Hgb, cloudy and orange; however patient is currently menstruating.  Hypokalemia- improved Mild Hyponatremia   Most recent K+ 3.6.NA 134. Patient is having regular BM. - Per GI keep K>4 and Mg>2 for Bowel function, due to regular BM will not replenish K+ to 4 at this time. -  AM CMP - Will continue to  monitor  Normocytic anemiahistory of B12 deficiency anemias/p gastric bypass Am Hgb9.6.Last hemoglobin in chart, prior to hospitalization was on 07/20/2019 was 10.6.B12 261, Folate 8.9. -Transfusion threshold of 7 due to lack of CAD/PAD comorbidities -Follow-up morning CBC  Hyperglycemia AM BGL 131. 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 Etiology of heavy alcohol use. She has since cut back, now drinking twice a week with reported 3-4 drinks per episode. Drink of choice white wine or vodka. Last drink 2 weeks ago.Most recent CIWA=0. Labs as above. - monitor CIWAs without ativan - Educate patient on alcohol use and adverse effects including effect on gastric ulcers  GERD Currently no complaints of heartburn. -Protonix40 BID for 8 weeks   FEN/GI: NPO, Sips w/ meds PPx: Lovenox  Disposition: Home  Subjective:  Overnight patient was febrile w/ abdominal pain, nasuea, and SOB. Provided anti-emetic medications and started on metro and ceftriaxone . Was seen bedside by overnight team. Decreased air movement was noted on the LLL. Patient was also noted to have decreased bowel sounds with tachycardia and regular rhythm. Vicodin did not alleviate pain.  Patient endorsed shortness of breath on ambulation to the bathroom.  Patient continues to have abdominal pain but no chest pain.  Patient reports abdominal pain radiates in all directions.  Patient reports feeling a bit numb in the left lower quadrant up to left upper quadrant.  Objective: Temp:  [99.5 F (37.5 C)-102.9 F (39.4 C)] 102.9 F (39.4 C) (08/02 0601) Pulse Rate:  [105-117] 117 (08/02 0601) Resp:  [18-20] 18 (08/02 0601) BP: (112-135)/(61-76) 126/76 (08/02 0601) SpO2:  [94 %-100 %] 96 % (08/02 0601) Physical Exam: General: NAD, resting in bed, appropriate mood and affect Cardiovascular: Tachycardic regular  rhythm, no murmur Respiratory: Decreased air movement noted in left lower lobe, otherwise clear lung fields throughout, no extra work of breathing noted while sitting Abdomen: Pain to palpation of left upper quadrant, tender to palpation left lower quadrant.  Normoactive bowel sounds Extremities: Distal pulses intact no edema noted  Laboratory: Recent Labs  Lab 02/12/20 0243 02/13/20 0326 02/14/20 0105  WBC 9.2 7.5 13.4*  HGB 8.8* 8.8* 9.6*  HCT 30.5* 30.3* 32.4*  PLT 340 280 293   Recent Labs  Lab 02/11/20 0105 02/11/20 0105 02/12/20 0243 02/13/20 0326 02/14/20 0105  NA 139   < > 136 133* 134*  K 3.4*   < > 3.3* 3.6 3.6  CL 109   < > 108 108 111  CO2 20*   < > 20* 19* 17*  BUN 6   < > 5* <5* 5*  CREATININE 0.58   < > 0.61 0.59 0.62  CALCIUM 9.9   < > 9.4 8.8* 8.4*  PROT 6.5  --  6.2* 5.6*  --   BILITOT 1.9*  --  2.8* 0.9  --   ALKPHOS 920*  --  813* 823*  --   ALT 71*  --  80* 61*  --   AST 118*  --  106* 42*  --   GLUCOSE 120*   < > 111* 102* 131*   < > = values in this interval not displayed.      Imaging/Diagnostic Tests: No results found. past 24 hours  Freida Busman, MD 02/14/2020, 6:43 AM PGY-1, [facility] Intern pager: [phone], text pages welcome  [facility] [phone] for any  questions or notifications regarding this patient  FMTS Attending Note: Dorcas Mcmurray MD I have examined patient, reviewed chart including consultants notes, labs and discussed with resident and team. Agree with assessment and plan as above.  1. Sepsis: acute. Source most likely bowel with possible perforation of small bowel after IR drainage of what was thought to be pancreatic pseudocyst. The drainage was suspicious for bile content concerning for small bowel rather than pseudocyst origin. Initially patient felt much improved after procedure but during the night she had fever, increasing pain, tachycardia. Urine and blood culture obtained, started on broad spectrum  antibiotics empirically. Transferred to higher level of care and we notified both surgery and GI of her acute change. Surgery saw her early this morning and have now taken her to the OR for further intervention. 2. Respiratory: SOB: Minimal changes seen on initial CXR thought tyo be telectasis. Overnight she was having some SOB with exertion(walking) and was noted to be taking somewhat shallow breaths, probably from increased abdominal pain. Exam had slight decreased breath sounds in lower left posterior lung field.   We had planned to repeat a 2 view CXR but surgery was able to get OR time before CXR could be performed. Will re-evaluate after return from surgery. She has no cough so I suspect this is atelectasis with some worsening symptoms from abdominal pain.

## 2020-02-14 NOTE — Progress Notes (Addendum)
Pt noted with temp 101.3. Also pt c/o abdominal pain, pt states Vicodin did not help.Notified on call Dr Nancy Fetter via text page.  1700- Dr Jeannine Kitten called back and received new orders.

## 2020-02-14 NOTE — Progress Notes (Signed)
   02/14/20 1957  Assess: MEWS Score  Temp 98.6 F (37 C)  BP 119/77  Pulse Rate (!) 118  ECG Heart Rate (!) 119  Resp (!) 30  Level of Consciousness Alert  SpO2 96 %  O2 Device Room Air  Assess: MEWS Score  MEWS Temp 0  MEWS Systolic 0  MEWS Pulse 2  MEWS RR 2  MEWS LOC 0  MEWS Score 4  MEWS Score Color Red  Assess: if the MEWS score is Yellow or Red  Were vital signs taken at a resting state? Yes  Focused Assessment Change from prior assessment (see assessment flowsheet)  Early Detection of Sepsis Score *See Row Information* High  MEWS guidelines implemented *See Row Information* Yes  Treat  MEWS Interventions Administered scheduled meds/treatments;Administered prn meds/treatments  Take Vital Signs  Increase Vital Sign Frequency  Red: Q 1hr X 4 then Q 4hr X 4, if remains red, continue Q 4hrs  Escalate  MEWS: Escalate Red: discuss with charge nurse/RN and provider, consider discussing with RRT  Notify: Charge Nurse/RN  Name of Charge Nurse/RN Notified Deborah, RN  Date Charge Nurse/RN Notified 02/14/20  Time Charge Nurse/RN Notified 2000  Document  Patient Outcome Other (Comment) (Continue to monitor)  Progress note created (see row info) Yes   Elaina Hoops, RN

## 2020-02-14 NOTE — Progress Notes (Addendum)
   02/13/20 2157  Assess: MEWS Score  Temp 100 F (37.8 C)  BP (!) 112/61  Pulse Rate (!) 115  Resp 20  SpO2 98 %  O2 Device Room Air  Assess: MEWS Score  MEWS Temp 0  MEWS Systolic 0  MEWS Pulse 2  MEWS RR 0  MEWS LOC 0  MEWS Score 2  MEWS Score Color Yellow  Assess: if the MEWS score is Yellow or Red  Were vital signs taken at a resting state? Yes  Focused Assessment Change from prior assessment (see assessment flowsheet)  Early Detection of Sepsis Score *See Row Information* Low  MEWS guidelines implemented *See Row Information* Yes  Treat  MEWS Interventions Administered prn meds/treatments  Pain Scale 0-10  Pain Score 8  Pain Type Acute pain  Pain Location Abdomen  Pain Orientation Left;Lower  Pain Descriptors / Indicators Aching;Cramping  Pain Frequency Constant  Pain Onset On-going  Patients Stated Pain Goal 2  Pain Intervention(s) Medication (See eMAR)  Take Vital Signs  Increase Vital Sign Frequency  Yellow: Q 2hr X 2 then Q 4hr X 2, if remains yellow, continue Q 4hrs  Escalate  MEWS: Escalate Yellow: discuss with charge nurse/RN and consider discussing with provider and RRT  Notify: Charge Nurse/RN  Name of Charge Nurse/RN Notified  [place] Parma Ragaas,RN)  Date Charge Nurse/RN Notified 02/13/20  Time Charge Nurse/RN Notified 2200  Notify: Provider  Provider Name/Title Dr. Nancy Fetter  Date Provider Notified 02/13/20  Time Provider Notified 2200  Notification Type Page  Notification Reason Other (Comment) (Yellow MEWS d/t elevated pulse)  Response No new orders  Date of Provider Response 02/13/20  Time of Provider Response 2212  Document  Progress note created (see row info) Yes

## 2020-02-14 NOTE — Progress Notes (Signed)
Attempted to call significant other x2  Called 6n and 6 e and nurses couldn't locate any family/friend  Leighton Ruff. [name], MD, FACS General, Bariatric, & Minimally Invasive Surgery [facility], [place]

## 2020-02-14 NOTE — Progress Notes (Signed)
   02/14/20 0601  Assess: MEWS Score  Temp (!) 102.9 F (39.4 C)  BP 126/76  Pulse Rate (!) 117  Resp 18  SpO2 96 %  O2 Device Room Air  Assess: MEWS Score  MEWS Temp 2  MEWS Systolic 0  MEWS Pulse 2  MEWS RR 0  MEWS LOC 0  MEWS Score 4  MEWS Score Color Red  Assess: if the MEWS score is Yellow or Red  Were vital signs taken at a resting state? Yes  Focused Assessment Change from prior assessment (see assessment flowsheet)  Early Detection of Sepsis Score *See Row Information* Medium  MEWS guidelines implemented *See Row Information* Yes  Treat  MEWS Interventions Escalated (See documentation below);Administered prn meds/treatments  Take Vital Signs  Increase Vital Sign Frequency  Red: Q 1hr X 4 then Q 4hr X 4, if remains red, continue Q 4hrs  Escalate  MEWS: Escalate Red: discuss with charge nurse/RN and provider, consider discussing with RRT  Notify: Charge Nurse/RN  Name of Charge Nurse/RN Notified Nellia Ragaas,RN  Date Charge Nurse/RN Notified 02/14/20  Time Charge Nurse/RN Notified 0601  Notify: Provider  Provider Name/Title Dr Irish Elders  Date Provider Notified 02/14/20  Time Provider Notified [phone]  Notification Type Page  Notification Reason Other (Comment) (RED MEWS d/t temp 102.9 and elevated pulse 117)  Response See new orders;Other (Comment) (MD stated will be seeing pt at bedside)  Date of Provider Response 02/14/20  Time of Provider Response [phone]  Notify: Rapid Response  Name of Rapid Response RN Notified Mindy Hopper,RN  Date Rapid Response Notified 02/14/20  Time Rapid Response Notified 0609  Document  Progress note created (see row info) Yes  0607-Pt on RED MEWS d/t temp 102.9 and elevated pulse-117. Dr. Irish Elders notified via text page and received call back at [phone]. Dr. Irish Elders stated they will see pt at bedside.Earlier Pt received Vicodin 7.5/325mg (w/c has Tylenol) at 0518, I asked Dr. Irish Elders if I can give the PRN Tylenol 650mg  and he stated I can give it.

## 2020-02-14 NOTE — Anesthesia Preprocedure Evaluation (Addendum)
Anesthesia Evaluation  Patient identified by MRN, date of birth, ID band Patient awake    Reviewed: Allergy & Precautions, NPO status , Patient's Chart, lab work & pertinent test results  Airway Mallampati: II  TM Distance: >3 FB Neck ROM: Full    Dental  (+) Teeth Intact, Dental Advisory Given   Pulmonary Current Smoker and Patient abstained from smoking.,    breath sounds clear to auscultation       Cardiovascular  Rhythm:Regular Rate:Normal     Neuro/Psych    GI/Hepatic   Endo/Other    Renal/GU      Musculoskeletal   Abdominal (+) + obese,   Peds  Hematology   Anesthesia Other Findings   Reproductive/Obstetrics                            Anesthesia Physical Anesthesia Plan  ASA: III and emergent  Anesthesia Plan: General   Post-op Pain Management:    Induction: Cricoid pressure planned and Rapid sequence  PONV Risk Score and Plan: Dexamethasone and Ondansetron  Airway Management Planned: Oral ETT  Additional Equipment:   Intra-op Plan:   Post-operative Plan: Extubation in OR  Informed Consent: I have reviewed the patients History and Physical, chart, labs and discussed the procedure including the risks, benefits and alternatives for the proposed anesthesia with the patient or authorized representative who has indicated his/her understanding and acceptance.     Dental advisory given  Plan Discussed with: CRNA and Anesthesiologist  Anesthesia Plan Comments:        Anesthesia Quick Evaluation

## 2020-02-14 NOTE — Progress Notes (Signed)
Pt arrived to unit with boyfriend at bedside who seemed very upset that no one had attempted to call him and stated her face looked very swollen. RN paged MD and also explained that the notes said MD attempted to call him twice. Family medicine paged and stated they would come speak with patient and visitor.

## 2020-02-14 NOTE — Anesthesia Procedure Notes (Signed)
Procedure Name: Intubation Date/Time: 02/14/2020 11:50 AM Performed by: Orlie Dakin, CRNA Pre-anesthesia Checklist: Patient identified, Emergency Drugs available, Suction available and Patient being monitored Patient Re-evaluated:Patient Re-evaluated prior to induction Oxygen Delivery Method: Circle system utilized Preoxygenation: Pre-oxygenation with 100% oxygen Induction Type: IV induction, Rapid sequence and Cricoid Pressure applied Laryngoscope Size: Mac and 4 Grade View: Grade I Tube type: Oral Tube size: 7.0 mm Number of attempts: 1 Airway Equipment and Method: Stylet Placement Confirmation: ETT inserted through vocal cords under direct vision,  positive ETCO2 and breath sounds checked- equal and bilateral Secured at: 23 cm Tube secured with: Tape Dental Injury: Teeth and Oropharynx as per pre-operative assessment  Comments: 4x4s bite block used.

## 2020-02-14 NOTE — Progress Notes (Addendum)
4 Days Post-Op  Subjective: CC: Patient with LUQ pain that worsened overnight to a 9/10. She reports it feels "numb". N/v yesterday with clear liquids and dry heaves this morning. Did pass some flatus yesterday. No BM reported by patient. Pain has become more moderate this morning.   Objective: Vital signs in last 24 hours: Temp:  [99.2 F (37.3 C)-102.9 F (39.4 C)] 99.3 F (37.4 C) (08/02 0800) Pulse Rate:  [105-117] 111 (08/02 0800) Resp:  [18-20] 20 (08/02 0800) BP: (112-135)/(61-76) 122/67 (08/02 0800) SpO2:  [94 %-100 %] 97 % (08/02 0800) Last BM Date: 02/13/20  Intake/Output from previous day: 08/01 0701 - 08/02 0700 In: 1376.3 [P.O.:60; I.V.:1116.3; IV Piggyback:200] Out: -  Intake/Output this shift: No intake/output data recorded.  PE: Gen:  Alert, NAD, pleasant Pulm: Normal rate and effort  Heart: Tachycardic  Abd: Soft with moderate distension, tenderness of the epigastrium > LUQ with some voluntary guarding. No rigidity or rebound. Hypoactive bowel sounds Psych: A&Ox3 Skin: no rashes noted, warm and dry   Lab Results:  Recent Labs    02/13/20 0326 02/14/20 0105  WBC 7.5 13.4*  HGB 8.8* 9.6*  HCT 30.3* 32.4*  PLT 280 293   BMET Recent Labs    02/13/20 0326 02/14/20 0105  NA 133* 134*  K 3.6 3.6  CL 108 111  CO2 19* 17*  GLUCOSE 102* 131*  BUN <5* 5*  CREATININE 0.59 0.62  CALCIUM 8.8* 8.4*   PT/INR No results for input(s): LABPROT, INR in the last 72 hours. CMP     Component Value Date/Time   NA 134 (L) 02/14/2020 0105   K 3.6 02/14/2020 0105   CL 111 02/14/2020 0105   CO2 17 (L) 02/14/2020 0105   GLUCOSE 131 (H) 02/14/2020 0105   BUN 5 (L) 02/14/2020 0105   CREATININE 0.62 02/14/2020 0105   CALCIUM 8.4 (L) 02/14/2020 0105   PROT 5.6 (L) 02/13/2020 0326   ALBUMIN 2.3 (L) 02/13/2020 0326   AST 42 (H) 02/13/2020 0326   ALT 61 (H) 02/13/2020 0326   ALKPHOS 823 (H) 02/13/2020 0326   BILITOT 0.9 02/13/2020 0326   GFRNONAA >60  02/14/2020 0105   GFRAA >60 02/14/2020 0105   Lipase     Component Value Date/Time   LIPASE 113 (H) 02/09/2020 1226       Studies/Results: CT IMAGE GUIDED DRAINAGE BY PERCUTANEOUS CATHETER  Result Date: 02/12/2020 INDICATION: History of gastric bypass, now with indeterminate fluid collection (potentially a pseudocyst versus a focally dilated loop of bowel versus a contained enteric perforation) within the left mid hemiabdomen resulting in obstruction of the afferent limb. As such, request made for CT-guided aspiration and/or drainage catheter placement. EXAM: CT IMAGE GUIDED DRAINAGE BY PERCUTANEOUS CATHETER COMPARISON:  CT abdomen and pelvis-02/09/2020 MEDICATIONS: The patient is currently admitted to the hospital and receiving intravenous antibiotics. The antibiotics were administered within an appropriate time frame prior to the initiation of the procedure. ANESTHESIA/SEDATION: Moderate (conscious) sedation was employed during this procedure. A total of Versed 2 mg and Fentanyl 100 mcg was administered intravenously. Moderate Sedation Time: 18 minutes. The patient's level of consciousness and vital signs were monitored continuously by radiology nursing throughout the procedure under my direct supervision. CONTRAST:  Approximately 50 cc Isovue 300, administered enterically via the temporary Yueh sheath catheter. COMPLICATIONS: None immediate. PROCEDURE: Informed written consent was obtained from the patient after a discussion of the risks, benefits and alternatives to treatment. The patient was placed supine on the  CT gantry and a pre procedural CT was performed re-demonstrating the known fluid collection within the left mid hemiabdomen with dominant component measuring approximately 10.3 x 7.4 cm (image 43, series 2). The procedure was planned. A timeout was performed prior to the initiation of the procedure. The supine was prepped and draped in the usual sterile fashion. The overlying soft tissues  were anesthetized with 1% lidocaine with epinephrine. Appropriate trajectory was planned with the use of a 22 gauge spinal needle. Next, a Yueh sheath catheter needle was advanced into the indeterminate fluid collection and a short Amplatz wire was coiled. Appropriate position was confirmed with limited CT imaging. Approximately 250 cc of bilious appearing fluid was aspirated from the collection. Postprocedural imaging demonstrates near complete resolution dominant collection within the left mid hemiabdomen. At this time, approximately 50 cc of Isovue-300 was administered via the Yueh sheath catheter. Two delayed CT scans were obtained and the Yueh sheath catheter was removed. A dressing was applied. The patient tolerated the procedure well without immediate postprocedural complication. FINDINGS: Preprocedural imaging demonstrates grossly unchanged size and appearance of the indeterminate fluid collection within the left mid hemiabdomen measuring at least 10.3 x 7.4 cm (image 43, series 2). After accessing the indeterminate fluid collection with a Yueh sheath catheter needle, approximately 250 cc of bilious appearing fluid was aspirated from the collection. An aspirated sample was capped and sent to the laboratory for amylase, lipase and bilirubin levels as well as culture analysis. Next, approximately 50 cc of Isovue-300 was administered via the Yueh sheath catheter needle demonstrating passage of contrast into the adjacent loops of the afferent limb of the gastric bypass (series 6 and 7). Note is also made of a small amount of serpiginous extraluminal contrast (representative images 1, 7 and 9), potentially secondary to an enteric perforation versus retraction of a side hole of the Yueh sheath catheter outside the intestinal lumen. IMPRESSION: 1. Successful CT guided aspiration of approximately 250 cc of bilious appearing fluid from indeterminate fluid collection with the left mid hemiabdomen. Samples sent for  amylase, lipase and bilirubin levels as well as culture analysis. 2. Following aspiration and near complete evacuation, contrast injection demonstrates opacification of adjacent afferent limbs suggesting that the indeterminate fluid collection with the left mid hemiabdomen may represent either a focally dilated patulous loop of the afferent limb versus a contained perforation as a result of afferent limb obstruction, presumably secondary to adhesions. Ultimately, pending results of submitted aspiration samples as well as the patient's response to today's aspiration, the patient may benefit from venting gastrostomy tube (potentially performed with CT guidance given lack of ability to performed gastric insufflation due to gastric bypass) as indicated. Above findings discussed at length with referring gastroenterologist, Dr. Bryan Lemma. Electronically Signed   By: Sandi Mariscal M.D.   On: 02/12/2020 14:52    Anti-infectives: Anti-infectives (From admission, onward)   Start     Dose/Rate Route Frequency Ordered Stop   02/14/20 0800  metroNIDAZOLE (FLAGYL) IVPB 500 mg     Discontinue     500 mg 100 mL/hr over 60 Minutes Intravenous Every 8 hours 02/14/20 0131     02/14/20 0230  cefTRIAXone (ROCEPHIN) 2 g in sodium chloride 0.9 % 100 mL IVPB     Discontinue     2 g 200 mL/hr over 30 Minutes Intravenous Daily at bedtime 02/14/20 0130     02/14/20 0130  metroNIDAZOLE (FLAGYL) IVPB 500 mg        500 mg 100 mL/hr over  60 Minutes Intravenous  Once 02/14/20 0121 02/14/20 0235   02/12/20 0800  vancomycin (VANCOCIN) IVPB 1000 mg/200 mL premix        1,000 mg 200 mL/hr over 60 Minutes Intravenous To Radiology 02/11/20 1619 02/12/20 1105       Assessment/Plan GERD  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 Underwent IR aspiration 7/31, that revealed bilious output. Pancreatic pseudocyst was likely distended small bowel actually  that was aspirated - There is mention of possibly placing a venting gastrostomy tube. Please hold on doing this. Will review CTs with radiology and Dr. [name] with updated recs.  - Febrile overnight. LUQ pain, n/v. Make NPO. 1V abd film - Cont abx  - PPI BID (crushed if taking in PO, otherwise IV) and Carafate - Keep K > 4 and Mg > 2 for bowel function.  - Mobilize for bowel function   FEN: NPO, IVF  VTE: SCDs, Lovenox (60mg  q24 hours currently) ID: Rocephin/Flagyl 8/2 >>. WBC 13.4. T max 102.9 overnight.    LOS: 4 days    Jillyn Ledger , [facility] 02/14/2020, 8:16 AM Please see Amion for pager number during day hours 7:00am-4:30pm

## 2020-02-14 NOTE — Progress Notes (Signed)
   02/14/20 1856  Assess: MEWS Score  Temp 98.4 F (36.9 C)  BP 112/73  Pulse Rate (!) 118  ECG Heart Rate (!) 118  Resp (!) 21  Level of Consciousness Alert  SpO2 95 %  O2 Device Room Air  Patient Activity (if Appropriate) In bed  Assess: MEWS Score  MEWS Temp 0  MEWS Systolic 0  MEWS Pulse 2  MEWS RR 1  MEWS LOC 0  MEWS Score 3  MEWS Score Color Yellow  Assess: if the MEWS score is Yellow or Red  Were vital signs taken at a resting state? Yes  Focused Assessment No change from prior assessment  Early Detection of Sepsis Score *See Row Information* High  MEWS guidelines implemented *See Row Information* Yes  Treat  MEWS Interventions Administered scheduled meds/treatments  Take Vital Signs  Increase Vital Sign Frequency  Yellow: Q 2hr X 2 then Q 4hr X 2, if remains yellow, continue Q 4hrs  Escalate  MEWS: Escalate Yellow: discuss with charge nurse/RN and consider discussing with provider and RRT  Notify: Charge Nurse/RN  Name of Charge Nurse/RN Notified Kristen, RN  Date Charge Nurse/RN Notified 02/14/20  Time Charge Nurse/RN Notified 1900  Document  Patient Outcome Other (Comment)  Progress note created (see row info) Yes  Pt arrived to unit from PACU. Yellow MEWS protocol implemented.

## 2020-02-14 NOTE — Progress Notes (Signed)
Pre-op report called and given to Sharyn Lull, RN in Short Stay. All questions answered to satisfaction. OR personnel to transport pt.

## 2020-02-14 NOTE — Plan of Care (Signed)
  Problem: Education: Goal: Knowledge of General Education information will improve Description: Including pain rating scale, medication(s)/side effects and non-pharmacologic comfort measures Outcome: Progressing   Problem: Health Behavior/Discharge Planning: Goal: Ability to manage health-related needs will improve Outcome: Progressing   Problem: Clinical Measurements: Goal: Ability to maintain clinical measurements within normal limits will improve Outcome: Progressing   Problem: Activity: Goal: Risk for activity intolerance will decrease Outcome: Progressing   Problem: Elimination: Goal: Will not experience complications related to bowel motility Outcome: Progressing Goal: Will not experience complications related to urinary retention Outcome: Progressing   Problem: Pain Managment: Goal: General experience of comfort will improve Outcome: Progressing   Problem: Safety: Goal: Ability to remain free from injury will improve Outcome: Progressing   Problem: Skin Integrity: Goal: Risk for impaired skin integrity will decrease Outcome: Progressing   

## 2020-02-15 ENCOUNTER — Inpatient Hospital Stay (HOSPITAL_COMMUNITY): Payer: BC Managed Care – PPO

## 2020-02-15 ENCOUNTER — Encounter (HOSPITAL_COMMUNITY): Payer: Self-pay | Admitting: General Surgery

## 2020-02-15 DIAGNOSIS — R1084 Generalized abdominal pain: Secondary | ICD-10-CM

## 2020-02-15 DIAGNOSIS — Z9889 Other specified postprocedural states: Secondary | ICD-10-CM

## 2020-02-15 LAB — CBC WITH DIFFERENTIAL/PLATELET
Abs Immature Granulocytes: 0.32 10*3/uL — ABNORMAL HIGH (ref 0.00–0.07)
Abs Immature Granulocytes: 0.73 10*3/uL — ABNORMAL HIGH (ref 0.00–0.07)
Basophils Absolute: 0 10*3/uL (ref 0.0–0.1)
Basophils Absolute: 0.1 10*3/uL (ref 0.0–0.1)
Basophils Relative: 0 %
Basophils Relative: 0 %
Eosinophils Absolute: 0 10*3/uL (ref 0.0–0.5)
Eosinophils Absolute: 0 10*3/uL (ref 0.0–0.5)
Eosinophils Relative: 0 %
Eosinophils Relative: 0 %
HCT: 27.7 % — ABNORMAL LOW (ref 36.0–46.0)
HCT: 28.5 % — ABNORMAL LOW (ref 36.0–46.0)
Hemoglobin: 8.3 g/dL — ABNORMAL LOW (ref 12.0–15.0)
Hemoglobin: 8.6 g/dL — ABNORMAL LOW (ref 12.0–15.0)
Immature Granulocytes: 2 %
Immature Granulocytes: 3 %
Lymphocytes Relative: 10 %
Lymphocytes Relative: 12 %
Lymphs Abs: 1.6 10*3/uL (ref 0.7–4.0)
Lymphs Abs: 2.6 10*3/uL (ref 0.7–4.0)
MCH: 25.3 pg — ABNORMAL LOW (ref 26.0–34.0)
MCH: 25.7 pg — ABNORMAL LOW (ref 26.0–34.0)
MCHC: 30 g/dL (ref 30.0–36.0)
MCHC: 30.2 g/dL (ref 30.0–36.0)
MCV: 84.5 fL (ref 80.0–100.0)
MCV: 85.1 fL (ref 80.0–100.0)
Monocytes Absolute: 1.1 10*3/uL — ABNORMAL HIGH (ref 0.1–1.0)
Monocytes Absolute: 1.5 10*3/uL — ABNORMAL HIGH (ref 0.1–1.0)
Monocytes Relative: 7 %
Monocytes Relative: 7 %
Neutro Abs: 13.9 10*3/uL — ABNORMAL HIGH (ref 1.7–7.7)
Neutro Abs: 17.3 10*3/uL — ABNORMAL HIGH (ref 1.7–7.7)
Neutrophils Relative %: 81 %
Neutrophils Relative %: 78 %
Platelets: 292 10*3/uL (ref 150–400)
Platelets: 297 10*3/uL (ref 150–400)
RBC: 3.28 MIL/uL — ABNORMAL LOW (ref 3.87–5.11)
RBC: 3.35 MIL/uL — ABNORMAL LOW (ref 3.87–5.11)
RDW: 20.1 % — ABNORMAL HIGH (ref 11.5–15.5)
RDW: 20.5 % — ABNORMAL HIGH (ref 11.5–15.5)
WBC: 16.9 10*3/uL — ABNORMAL HIGH (ref 4.0–10.5)
WBC: 22.2 10*3/uL — ABNORMAL HIGH (ref 4.0–10.5)
nRBC: 0.6 % — ABNORMAL HIGH (ref 0.0–0.2)
nRBC: 0.9 % — ABNORMAL HIGH (ref 0.0–0.2)

## 2020-02-15 LAB — URINALYSIS, ROUTINE W REFLEX MICROSCOPIC
Bilirubin Urine: NEGATIVE
Glucose, UA: NEGATIVE mg/dL
Hgb urine dipstick: NEGATIVE
Ketones, ur: NEGATIVE mg/dL
Nitrite: NEGATIVE
Protein, ur: 30 mg/dL — AB
Specific Gravity, Urine: 1.032 — ABNORMAL HIGH (ref 1.005–1.030)
pH: 5 (ref 5.0–8.0)

## 2020-02-15 LAB — BASIC METABOLIC PANEL
Anion gap: 6 (ref 5–15)
BUN: 7 mg/dL (ref 6–20)
CO2: 20 mmol/L — ABNORMAL LOW (ref 22–32)
Calcium: 8 mg/dL — ABNORMAL LOW (ref 8.9–10.3)
Chloride: 108 mmol/L (ref 98–111)
Creatinine, Ser: 0.63 mg/dL (ref 0.44–1.00)
GFR calc Af Amer: >60 mL/min (ref >60)
GFR calc non Af Amer: >60 mL/min (ref >60)
Glucose, Bld: 137 mg/dL — ABNORMAL HIGH (ref 70–99)
Potassium: 3.7 mmol/L (ref 3.5–5.1)
Sodium: 134 mmol/L — ABNORMAL LOW (ref 135–145)

## 2020-02-15 MED ORDER — GUAIFENESIN-DM 100-10 MG/5ML PO SYRP
5.0000 mL | ORAL_SOLUTION | ORAL | Status: DC | PRN
  Administered 2020-02-15 (×3): 5 mL via ORAL
  Filled 2020-02-15 (×4): qty 5

## 2020-02-15 MED ORDER — CHLORHEXIDINE GLUCONATE CLOTH 2 % EX PADS
6.0000 | MEDICATED_PAD | Freq: Every day | CUTANEOUS | Status: DC
  Administered 2020-02-15 – 2020-02-23 (×9): 6 via TOPICAL

## 2020-02-15 NOTE — Progress Notes (Signed)
Went to bedside to assess patient. Patient was amidst transferring from chair to bed. She reports feeling a little better today, still having nausea but no vomiting.  She appeared to be in mild pain. Wound vac, JP, and G tube in place.  No concerns at this time. Advised patient to inform her nurse if she needs [place] for anything.

## 2020-02-15 NOTE — Progress Notes (Signed)
Patient ID: Alexis Avila, female   DOB: [birth year], 39 y.o.   MRN: [id number]   Acute Care Surgery Service Progress Note:    Chief Complaint/Subjective: Sore Pain is controlled  No IS in room   Objective: Vital signs in last 24 hours: Temp:  [98.1 F (36.7 C)-100.5 F (38.1 C)] 98.3 F (36.8 C) (08/03 1241) Pulse Rate:  [114-121] 120 (08/03 1241) Resp:  [20-33] 20 (08/03 1241) BP: (112-127)/(64-77) 114/72 (08/03 1241) SpO2:  [94 %-99 %] 99 % (08/03 1241) Last BM Date: 02/14/20  Intake/Output from previous day: 08/02 0701 - 08/03 0700 In: 2500 [I.V.:2000; IV Piggyback:500] Out: 3460 [Urine:735; Drains:575; Blood:500] Intake/Output this shift: Total I/O In: -  Out: 1670 [Urine:725; Drains:945]  Lungs: cta, nonlabored  Cardiovascular: reg  Abd: obese, soft, wound vac in place, JP - serosang G tube - bilious  Extremities: no edema, +SCDs  Neuro: alert, nonfocal  Lab Results: CBC  Recent Labs    02/14/20 0105 02/15/20 0514  WBC 13.4* 16.9*  HGB 9.6* 8.3*  HCT 32.4* 27.7*  PLT 293 292   BMET Recent Labs    02/14/20 0105 02/15/20 0514  NA 134* 134*  K 3.6 3.7  CL 111 108  CO2 17* 20*  GLUCOSE 131* 137*  BUN 5* 7  CREATININE 0.62 0.63  CALCIUM 8.4* 8.0*   LFT Hepatic Function Latest Ref Rng & Units 02/13/2020 02/12/2020 02/11/2020  Total Protein 6.5 - 8.1 g/dL 5.6(L) 6.2(L) 6.5  Albumin 3.5 - 5.0 g/dL 2.3(L) 2.4(L) 2.7(L)  AST 15 - 41 U/L 42(H) 106(H) 118(H)  ALT 0 - 44 U/L 61(H) 80(H) 71(H)  Alk Phosphatase 38 - 126 U/L 823(H) 813(H) 920(H)  Total Bilirubin 0.3 - 1.2 mg/dL 0.9 2.8(H) 1.9(H)   PT/INR No results for input(s): LABPROT, INR in the last 72 hours. ABG No results for input(s): PHART, HCO3 in the last 72 hours.  Invalid input(s): PCO2, PO2  Studies/Results:  Anti-infectives: Anti-infectives (From admission, onward)   Start     Dose/Rate Route Frequency Ordered Stop   02/14/20 0800  metroNIDAZOLE (FLAGYL) IVPB 500 mg     Discontinue      500 mg 100 mL/hr over 60 Minutes Intravenous Every 8 hours 02/14/20 0131     02/14/20 0230  cefTRIAXone (ROCEPHIN) 2 g in sodium chloride 0.9 % 100 mL IVPB     Discontinue     2 g 200 mL/hr over 30 Minutes Intravenous Daily at bedtime 02/14/20 0130     02/14/20 0130  metroNIDAZOLE (FLAGYL) IVPB 500 mg        500 mg 100 mL/hr over 60 Minutes Intravenous  Once 02/14/20 0121 02/14/20 0235   02/12/20 0800  vancomycin (VANCOCIN) IVPB 1000 mg/200 mL premix        1,000 mg 200 mL/hr over 60 Minutes Intravenous To Radiology 02/11/20 1619 02/12/20 1105      Medications: Scheduled Meds: . Chlorhexidine Gluconate Cloth  6 each Topical Daily  . enoxaparin (LOVENOX) injection  60 mg Subcutaneous Q24H  . pantoprazole (PROTONIX) IV  40 mg Intravenous Q12H   Continuous Infusions: . sodium chloride 125 mL/hr at 02/15/20 0855  . acetaminophen 1,000 mg (02/15/20 1559)  . cefTRIAXone (ROCEPHIN)  IV 2 g (02/14/20 2254)  . methocarbamol (ROBAXIN) IV 500 mg (02/14/20 1839)  . metronidazole 500 mg (02/15/20 1604)   PRN Meds:.fentaNYL, guaiFENesin-dextromethorphan, lidocaine (PF), methocarbamol (ROBAXIN) IV, midazolam, morphine injection, ondansetron (ZOFRAN) IV, promethazine  Assessment/Plan: Patient Active Problem List   Diagnosis Date Noted  .  Abdominal pain   . Elevated LFTs   . Marginal ulcers   . History of Roux-en-Y gastric bypass   . Intractable nausea and vomiting 02/09/2020  . Hypokalemia   . Pseudocyst of pancreas   . SBO (small bowel obstruction) ([place])   . History of pancreatitis 07/20/2019  . Status post gastric bypass for obesity 07/20/2019  . Anemia, B12 deficiency 07/20/2019  . Current smoker 07/20/2019  . Chronic back pain 07/20/2019  . Ovarian cyst 07/20/2019  . GERD (gastroesophageal reflux disease) 07/20/2019  . Morbid obesity ([place]) 07/20/2019   s/p Procedure(s): EXPLORATORY LAPAROTOMY INSERTION OF GASTROSTOMY TUBE LYSIS OF ADHESION >2 HOURS DRAINAGE OF  INTRAABDOMINAL ABSCESS APPLICATION OF WOUND VAC PARTIAL RESECTION OF ROUX LIMB 02/14/2020  S/p exp lap, lysis of adhesions >2hrs, drainage of intra-abdominal hematoma/abscess, insertion of G tube in remnant stomach, partial resection/reconstruction of roux limb 8/2 Dr [name]  GI- can have some ice chips. Need to wait 36hrs for some clears due to proximal resection of roux limb. Cant use g tube for feeds since status of BP limb is unclear. Plan to do contrasted study thru G tube in a few days to make sure no leak from BP limb, g tube to gravity  Renal - Cr ok, uop ok. Dc foley ID - cont iv abx x 4 days FEN - cont mivf, lytes ok, can have some ice chips/sips of water Pulm -no IS in room, needs IS and OOB VTE prophylaxis - scds, lovenox Wound - wound vac  Marginal ulcer - cont bid IV protonix; when resume oral intake - will add carafate back, protonix will need to be crushed - not taken whole   Discussed intraop findings with pt and annotated diagram Described my efforts to locate boyfriend yesterday  Disposition:  LOS: 5 days    Leighton Ruff. [name], MD, FACS General, Bariatric, & Minimally Invasive Surgery [phone] [facility]

## 2020-02-15 NOTE — Progress Notes (Signed)
OR findings from yesterday noted.  We will sign off for now.  I am around all week, weekend for any new questions or concerns.   Owens Loffler, MD [facility] Pager [phone]

## 2020-02-15 NOTE — Progress Notes (Signed)
Pt provided with incentive spirometer, education given on use and frequency, up in chair without difficulty, states pain less intense since getting up.

## 2020-02-15 NOTE — Progress Notes (Signed)
Interim Note  Spoke w/ Dr. [name] concerning recommendations. Per phone call, Patient will remain NPO except sips w/ Meds. There is a possibility that patient will go to radiology later this week in which G tube will be used for contrast intake and read. There is no plan to use G tube for enteral nutrition at this time. Patient will be kept on Empiric abx for 4 days.

## 2020-02-15 NOTE — Progress Notes (Signed)
Family Medicine Teaching Service Daily Progress Note Intern Pager: [phone]  Patient name: Alexis Avila Medical record number: [id number] Date of birth: 1980-11-28 Age: 39 y.o. Gender: female  Primary Care Provider: [name], MD Consultants: GI and surgery Code Status: FULL  Pt Overview and Major Events to Date:  Admitted 7/28 Ex- Lap 8/2   Assessment and Plan: Alexis Boltonis a 39 y.o.femalepresenting with 5-day history epigastric abdominal pain, found to havepartialSBO  Apparently secondary to  pancreatic pseudocyst.Also found to have marginal ulcer on EGD. PMH is significant forGERD, pancreatitis and prior Roux n Y surgery for besity.   Sepsis, likely source bowel with possible perforation- acute, worsening Patient meets criteria for sepsis continues to have tachycardia, daily fever, and a source. Patient's bowel was possibly perforated with IR aspiration of what was believed to be a pseudocyst per GI and surgery; area aspirated  now believed to potentially  be small bowel, per GI and surgery notes and discussion.  Patient underwent ex lap and per surgeon there is a possible hematoma/abscess located in the abdomen that was drained. Patient will continue Metronidazole and Ceftriaxone due to fever, tachycardia, and tachypnea. Patient was placed on ceftriaxone rather than zosyn due to concern for cross-reaction with PCN allergy.  WBC 16.9. Patient Ucx came back w/ multiple species present and recommended recollection. -  Progressive care - Continue metronidazole and ceftriaxone -F/U postop surgery recommendations -f/u UA, Urine gram stain, and repeat Ucx  Gastric ulcers- acute, improving EGD showed large marginal ulcer. Biopsies of ulcer returned:Inflammation but negative for H. Pylori. -ContinueProtonix 40 BID for 8 weeks  -ContinueSucralfate 1g ac/qh  - per surgery, NPO   Small bowel loop, acute- worsening Patient underwent surgical procedure 8/2.  Dr.  [name] also surgeon performed exploratory laparotomy, lysis of adhesions, drainage of intra-abdominal hematoma/abscess, partial resection of Roux limb, insertion of 18 French gastrotomy tube in the gastric remnant.  KUB 8/2, found no bowel obstruction or free air.  Moderate stool in colon.  WBC 16.9 with elevated neutrophils, Hgb 8.3.  K+ 3.7, Na 134. GI signing off today 8/3, they are available should they needed to be contact, per note.  - f/u surgery recomendations -Vitals per floor protocol -A.m. CMP/CBC -Continue Zofranas neededfor nausea  -Con'tLovenoxfor DVT prophylaxis,  - Scheduled Miralax  -Per GI recommendation Keep K >4 and Mg >2 for bowel function - Continue abx: ceftriaxone day #2, Metronidazole day #2 -  Continue Morphine 2mg  q 2hrs PRN     Elevated alkaline phosphatase ALP1,066> 920>813>823,AST 29>64>118>106>42, ALT 55>61>71>80>61. Patient endorsed daily BM's prior to ex-lap. - miralax scheduled -Continue to monitor postop  Shortness of breath- acute, worsening Patientendorses shortness of breath when walking. CXR7/28,was significant for mild left basilar atelectasis.  There is decreased air movement on the lower left lobe.   - cont to monitor. Symptoms are likely related to increasing abdominal pain and "splinting" of deep inspiration. No cough. No SOB at rest. -  Start Robitussin DM   Recent diagnosis of UTI- resolved UCx prior to ex lap, 8/2 had multiple species present, suggest recollection.  Patient has been urinating well does not report increased frequency or pain with urination while hospitalized. Foley inserted and is having red-orange output. - UA ordered w/ gram stain and culture  Hypokalemia- improved Mild Hyponatremia   Most recent K+ 3.7.NA 134.  Patient endorsed regular BM prior to ex lap. - Per GI keep K>4 and Mg>2 for Bowel function, due to regular BM will not replenish K+ to 4  at this time. -AM CMP - Will continue to  monitor  Normocytic anemiahistory of B12 deficiency anemias/p gastric bypass Am Hgb8.3. We will repeat @ 1600.Last hemoglobin in chart, prior to hospitalization was on 07/20/2019 was 10.6.B12 261, Folate 8.9. -Transfusion threshold of 7 due to lack of CAD/PAD comorbidities -Follow-up morning CBC  Hyperglycemia AM BGL 137. 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 Etiology of heavy alcohol use. She has since cut back, now drinking twice a week with reported 3-4 drinks per episode. Drink of choice white wine or vodka. Last drink 2 weeks ago.Most recent CIWA=0. Labs as above. -Discontinue CIWAs - Educate patient on alcohol use and adverse effects including effect on gastric ulcers  GERD Currently no complaints of heartburn. -Protonix40 BID for 8 weeks    FEN/GI: N.p.o. PPx: Lovenox  Disposition: Home  Subjective:  Patient is resting in bed.  She does report some SOB and would like to take sips of water.   Objective: Temp:  [98.1 F (36.7 C)-100.5 F (38.1 C)] 98.4 F (36.9 C) (08/03 0423) Pulse Rate:  [108-121] 114 (08/03 0423) Resp:  [18-33] 29 (08/03 0423) BP: (112-130)/(62-77) 112/71 (08/03 0423) SpO2:  [94 %-98 %] 95 % (08/03 0423) Physical Exam: General: Patient resting in bed, NAD Cardiovascular: tachyardiac regular rhythm, no murmur Respiratory: Clear and equal bilaterally on apical and lateral lobes, no increased work of breathing noted on room air Abdomen: Wound VAC on open excision, gastrotomy tube draining bilious fluid Extremities: Bilateral distal pulses intact with SCDs on GU: Foley draining urine.  Red-orange in color Laboratory: Recent Labs  Lab 02/13/20 0326 02/14/20 0105 02/15/20 0514  WBC 7.5 13.4* 16.9*  HGB 8.8* 9.6* 8.3*  HCT 30.3* 32.4* 27.7*  PLT 280 293 292   Recent Labs  Lab 02/11/20 0105 02/11/20 0105 02/12/20 0243  02/12/20 0243 02/13/20 0326 02/14/20 0105 02/15/20 0514  NA 139   < > 136   < > 133* 134* 134*  K 3.4*   < > 3.3*   < > 3.6 3.6 3.7  CL 109   < > 108   < > 108 111 108  CO2 20*   < > 20*   < > 19* 17* 20*  BUN 6   < > 5*   < > <5* 5* 7  CREATININE 0.58   < > 0.61   < > 0.59 0.62 0.63  CALCIUM 9.9   < > 9.4   < > 8.8* 8.4* 8.0*  PROT 6.5  --  6.2*  --  5.6*  --   --   BILITOT 1.9*  --  2.8*  --  0.9  --   --   ALKPHOS 920*  --  813*  --  823*  --   --   ALT 71*  --  80*  --  61*  --   --   AST 118*  --  106*  --  42*  --   --   GLUCOSE 120*   < > 111*   < > 102* 131* 137*   < > = values in this interval not displayed.      Imaging/Diagnostic Tests: DG Abd Portable 1V  Result Date: 02/14/2020 CLINICAL DATA:  Abdominal pain with emesis EXAM: PORTABLE ABDOMEN - 1 VIEW COMPARISON:  February 09, 2020 FINDINGS: There is moderate stool in the colon. There is no bowel dilatation or air-fluid  level to suggest bowel obstruction. No free air. There are surgical clips in the right abdomen. IMPRESSION: No bowel obstruction or free air.  Moderate stool in colon. Electronically Signed   By: Lowella Grip III M.D.   On: 02/14/2020 09:15    Freida Busman, MD 02/15/2020, 6:53 AM PGY-1, [place] Intern pager: [phone], text pages welcome

## 2020-02-16 DIAGNOSIS — R0600 Dyspnea, unspecified: Secondary | ICD-10-CM

## 2020-02-16 LAB — CBC WITH DIFFERENTIAL/PLATELET
Abs Immature Granulocytes: 1.01 10*3/uL — ABNORMAL HIGH (ref 0.00–0.07)
Basophils Absolute: 0.1 10*3/uL (ref 0.0–0.1)
Basophils Relative: 0 %
Eosinophils Absolute: 0 10*3/uL (ref 0.0–0.5)
Eosinophils Relative: 0 %
HCT: 24.8 % — ABNORMAL LOW (ref 36.0–46.0)
Hemoglobin: 7.7 g/dL — ABNORMAL LOW (ref 12.0–15.0)
Immature Granulocytes: 5 %
Lymphocytes Relative: 13 %
Lymphs Abs: 2.7 10*3/uL (ref 0.7–4.0)
MCH: 26 pg (ref 26.0–34.0)
MCHC: 31 g/dL (ref 30.0–36.0)
MCV: 83.8 fL (ref 80.0–100.0)
Monocytes Absolute: 1.5 10*3/uL — ABNORMAL HIGH (ref 0.1–1.0)
Monocytes Relative: 7 %
Neutro Abs: 15.2 10*3/uL — ABNORMAL HIGH (ref 1.7–7.7)
Neutrophils Relative %: 75 %
Platelets: 265 10*3/uL (ref 150–400)
RBC: 2.96 MIL/uL — ABNORMAL LOW (ref 3.87–5.11)
RDW: 20 % — ABNORMAL HIGH (ref 11.5–15.5)
WBC: 20.5 10*3/uL — ABNORMAL HIGH (ref 4.0–10.5)
nRBC: 1.2 % — ABNORMAL HIGH (ref 0.0–0.2)

## 2020-02-16 LAB — HEMOGLOBIN AND HEMATOCRIT, BLOOD
HCT: 24.9 % — ABNORMAL LOW (ref 36.0–46.0)
Hemoglobin: 7.4 g/dL — ABNORMAL LOW (ref 12.0–15.0)

## 2020-02-16 LAB — COMPREHENSIVE METABOLIC PANEL
ALT: 26 U/L (ref 0–44)
AST: 19 U/L (ref 15–41)
Albumin: 1.8 g/dL — ABNORMAL LOW (ref 3.5–5.0)
Alkaline Phosphatase: 429 U/L — ABNORMAL HIGH (ref 38–126)
Anion gap: 8 (ref 5–15)
BUN: 5 mg/dL — ABNORMAL LOW (ref 6–20)
CO2: 21 mmol/L — ABNORMAL LOW (ref 22–32)
Calcium: 7.8 mg/dL — ABNORMAL LOW (ref 8.9–10.3)
Chloride: 106 mmol/L (ref 98–111)
Creatinine, Ser: 0.68 mg/dL (ref 0.44–1.00)
GFR calc Af Amer: >60 mL/min (ref >60)
GFR calc non Af Amer: >60 mL/min (ref >60)
Glucose, Bld: 111 mg/dL — ABNORMAL HIGH (ref 70–99)
Potassium: 3.5 mmol/L (ref 3.5–5.1)
Sodium: 135 mmol/L (ref 135–145)
Total Bilirubin: 1.4 mg/dL — ABNORMAL HIGH (ref 0.3–1.2)
Total Protein: 5.2 g/dL — ABNORMAL LOW (ref 6.5–8.1)

## 2020-02-16 LAB — LACTATE DEHYDROGENASE: LDH: 227 U/L — ABNORMAL HIGH (ref 98–192)

## 2020-02-16 MED ORDER — ACETAMINOPHEN 10 MG/ML IV SOLN
1000.0000 mg | Freq: Four times a day (QID) | INTRAVENOUS | Status: AC
  Administered 2020-02-16 – 2020-02-17 (×4): 1000 mg via INTRAVENOUS
  Filled 2020-02-16 (×4): qty 100

## 2020-02-16 NOTE — Consult Note (Signed)
[facility] Nurse Consult Note: Patient receiving care in [place].  Alferd Apa, PA, at bedside for NPWT abdominal dressing change. Reason for Consult: NPWT to abdominal wound Wound type: surgical Pressure Injury POA: Yes/No/NA Measurement: 24 cm x 9 cm x 7 cm Wound bed: 100% pink, clean Drainage (amount, consistency, odor) no odor, serosanginous in cannister Periwound: intact Dressing procedure/placement/frequency: Patient medicated with IV med prior to removal of foam/placement of new foam. One piece of black foam stapled into wound, PA removed staples, foam removed.  One piece of black foam placed, immediate seal obtained. I have requested 3 large foam dressing kits. Val Riles, RN, MSN, CWOCN, CNS-BC, pager [phone]

## 2020-02-16 NOTE — Progress Notes (Signed)
2 Days Post-Op  Subjective: CC: Abdominal pain Feels better than yesterday. Still having constant pain in her LUQ and to the right of her midline wound. Occasional nausea yesterday but none this am. No emesis. Some flatus this am. No BM. OOB to bedside commode and chair this morning. Has not ambulated in halls. Foley out and voiding without difficulty.   Objective: Vital signs in last 24 hours: Temp:  [98 F (36.7 C)-99 F (37.2 C)] 99 F (37.2 C) (08/04 0550) Pulse Rate:  [112-122] 112 (08/04 0550) Resp:  [20-26] 26 (08/03 1943) BP: (114-140)/(65-77) 123/65 (08/04 0550) SpO2:  [98 %-99 %] 98 % (08/04 0550) Last BM Date: 02/14/20  Intake/Output from previous day: 08/03 0701 - 08/04 0700 In: 4697.3 [I.V.:3542.3; IV Piggyback:300] Out: 3150 [Urine:1625; Drains:1525] Intake/Output this shift: No intake/output data recorded.  PE: Gen:  Alert, NAD, pleasant Card: Tachycardic with regular rhythm  Pulm:  CTAB, no W/R/R, effort normal. Pulling 1000 on IS Abd: Soft, mild distension, tenderness of the LUQ and to the right of midline wound. Slightly hypoactive BS. Midline wound with wound vac in place. Changed with WOCN today. Please see picture below. Large fat globules with healthy appearing granulation tissue on the sides and base, without signs of dehiscence or infection. Wound vac drainage bloody. JP in place with Serous output in cannister and thick purulent material in tube that was stripped. G-tube to gravity with bilious output in bag.  Ext:  No LE edema  Psych: A&Ox3  Skin: no rashes noted, warm and dry    Lab Results:  Recent Labs    02/15/20 1613 02/16/20 0457  WBC 22.2* 20.5*  HGB 8.6* 7.7*  HCT 28.5* 24.8*  PLT 297 265   BMET Recent Labs    02/15/20 0514 02/16/20 0457  NA 134* 135  K 3.7 3.5  CL 108 106  CO2 20* 21*  GLUCOSE 137* 111*  BUN 7 5*  CREATININE 0.63 0.68  CALCIUM 8.0* 7.8*   PT/INR No results for input(s): LABPROT, INR in the last 72  hours. CMP     Component Value Date/Time   NA 135 02/16/2020 0457   K 3.5 02/16/2020 0457   CL 106 02/16/2020 0457   CO2 21 (L) 02/16/2020 0457   GLUCOSE 111 (H) 02/16/2020 0457   BUN 5 (L) 02/16/2020 0457   CREATININE 0.68 02/16/2020 0457   CALCIUM 7.8 (L) 02/16/2020 0457   PROT 5.2 (L) 02/16/2020 0457   ALBUMIN 1.8 (L) 02/16/2020 0457   AST 19 02/16/2020 0457   ALT 26 02/16/2020 0457   ALKPHOS 429 (H) 02/16/2020 0457   BILITOT 1.4 (H) 02/16/2020 0457   GFRNONAA >60 02/16/2020 0457   GFRAA >60 02/16/2020 0457   Lipase     Component Value Date/Time   LIPASE 113 (H) 02/09/2020 1226       Studies/Results: DG Chest 2 View  Result Date: 02/15/2020 CLINICAL DATA:  Shortness of breath. EXAM: CHEST - 2 VIEW COMPARISON:  Chest x-ray 02/09/2020. FINDINGS: Mediastinum and hilar structures normal. Heart size normal. Low lung volumes with bibasilar atelectasis/infiltrates. Rounded density noted over the left base. This could represent atelectasis or a fissural fluid pseudotumor. To exclude mass lesion follow-up exam suggested to demonstrate resolution. No pneumothorax. IMPRESSION: 1.  Low lung volumes with bibasilar atelectasis/infiltrate. 2. Rounded density noted over the left base. This could represent atelectasis and or fissural fluid pseudotumor. To exclude mass lesion follow-up exam suggested to demonstrate resolution. Electronically Signed   By:  Alston   On: 02/15/2020 09:36   DG Abd Portable 1V  Result Date: 02/14/2020 CLINICAL DATA:  Abdominal pain with emesis EXAM: PORTABLE ABDOMEN - 1 VIEW COMPARISON:  February 09, 2020 FINDINGS: There is moderate stool in the colon. There is no bowel dilatation or air-fluid level to suggest bowel obstruction. No free air. There are surgical clips in the right abdomen. IMPRESSION: No bowel obstruction or free air.  Moderate stool in colon. Electronically Signed   By: Lowella Grip III M.D.   On: 02/14/2020 09:15     Anti-infectives: Anti-infectives (From admission, onward)   Start     Dose/Rate Route Frequency Ordered Stop   02/14/20 0800  metroNIDAZOLE (FLAGYL) IVPB 500 mg     Discontinue     500 mg 100 mL/hr over 60 Minutes Intravenous Every 8 hours 02/14/20 0131     02/14/20 0230  cefTRIAXone (ROCEPHIN) 2 g in sodium chloride 0.9 % 100 mL IVPB     Discontinue     2 g 200 mL/hr over 30 Minutes Intravenous Daily at bedtime 02/14/20 0130     02/14/20 0130  metroNIDAZOLE (FLAGYL) IVPB 500 mg        500 mg 100 mL/hr over 60 Minutes Intravenous  Once 02/14/20 0121 02/14/20 0235   02/12/20 0800  vancomycin (VANCOCIN) IVPB 1000 mg/200 mL premix        1,000 mg 200 mL/hr over 60 Minutes Intravenous To Radiology 02/11/20 1619 02/12/20 1105       Assessment/Plan Marginal ulcer - cont bid IV protonix; when resume oral intake and tolerating - will add carafate back, protonix will need to be crushed - not taken whole. Will reassess tomorrow.  ABL Anemia -  hgb 7.7 from 8.6. Repeat labs in AM. Monitor. 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 Left abdominal wall hematoma/abscess with possible perforation.  Serosal tear, Roux limb - S/p exp lap, lysis of adhesions >2hrs, drainage of intra-abdominal hematoma/abscess, insertion of G tube in remnant stomach, partial resection/reconstruction of roux limb 8/2 Dr [name] - POD #2 - Will give bariatric clear liquids today, discussed to take this slow - Maintain JP - Maintain G-tube in remnant stomach to gravity. Cannot use g tube for feeds since status of BP limb is unclear. Plan to do contrasted study thru G tube in a few days to make sure no leak from BP limb. - Cont abx  - Pulm toilet, pulling 1000. 10x/hour - mobilize, PT ordered  - WOCN for wound vac change M/W/F  FEN - Bariactic clears, IVF, K 3.5 (goal > 4) ID - Rocephin/Flagyl. Cont iv  abx x 4 days post op. Afebrile. WBC 20.5 VTE prophylaxis - scds, lovenox (on 60mg  sub QD currently) Foley - out   LOS: 6 days    Jillyn Ledger , [facility] Surgery 02/16/2020, 7:48 AM Please see Amion for pager number during day hours 7:00am-4:30pm

## 2020-02-16 NOTE — Evaluation (Signed)
Physical Therapy Evaluation Patient Details Name: Alexis Avila MRN: [id number] DOB: [birth year] Today's Date: 02/16/2020   History of Present Illness  39 yo female with history of gastric bypass surgery was admitted after having inability to eat, SOB and had UTI.  Went home and returned with dizziness, weakness and gastric pain.  Has partial SBO, low electrolytes, elevated BS, and was seen on 7/29 for enteroscopy, with sepsis symptoms afterward.  Returned to OR for suspected perforation of SB loop, had laparotomy with G-tube on 02/14/20, lysis of adhesions and drainage of IAA done.  Referred to PT.    Clinical Impression  Pt was seen for initiation of mobility and will be carried on acute therapy list to increase her control and balance with gait.  Her efforts to walk were hindered mainly by open skin and wound vac on abd.  Follow for goals of PT as written.    Follow Up Recommendations Home health PT    Equipment Recommendations  None recommended by PT    Recommendations for Other Services       Precautions / Restrictions Precautions Precautions: Fall Precaution Comments: monitor her vitals Restrictions Weight Bearing Restrictions: No  Wound Vac     Mobility  Bed Mobility               General bed mobility comments: up in chair when PT arrived  Transfers Overall transfer level: Needs assistance Equipment used: 1 person hand held assist Transfers: Sit to/from Stand Sit to Stand: Independent            Ambulation/Gait Ambulation/Gait assistance: Min guard Gait Distance (Feet): 200 Feet Assistive device: 1 person hand held assist Gait Pattern/deviations: Step-through pattern;Wide base of support Gait velocity: reduced   General Gait Details: Pt walked with IV pole, slow paced with protective posture   Stairs            Wheelchair Mobility    Modified Rankin (Stroke Patients Only)       Balance Overall balance assessment: Needs  assistance Sitting-balance support: Feet supported Sitting balance-Leahy Scale: Fair       Standing balance-Leahy Scale: Fair                               Pertinent Vitals/Pain Pain Assessment: No/denies pain    Home Living Family/patient expects to be discharged to:: Private residence Living Arrangements: Spouse/significant other;Parent Available Help at Discharge: Family;Available 24 hours/day Type of Home: House Home Access: Level entry Entrance Stairs-Rails: None   Home Layout: One level Home Equipment: None      Prior Function Level of Independence: Independent               Hand Dominance   Dominant Hand: Right    Extremity/Trunk Assessment        Lower Extremity Assessment Lower Extremity Assessment: Overall WFL for tasks assessed    Cervical / Trunk Assessment Cervical / Trunk Assessment: Normal  Communication   Communication: No difficulties  Cognition Arousal/Alertness: Awake/alert Behavior During Therapy: WFL for tasks assessed/performed Overall Cognitive Status: Within Functional Limits for tasks assessed                                        General Comments General comments (skin integrity, edema, etc.): pt was able to demonstrate controlled pace but is working hard to avoid  abd stress    Exercises     Assessment/Plan    PT Assessment Patient needs continued PT services  PT Problem List Decreased range of motion;Decreased activity tolerance;Decreased balance;Decreased mobility;Decreased knowledge of use of DME;Decreased safety awareness;Decreased skin integrity;Pain       PT Treatment Interventions DME instruction;Gait training;Functional mobility training;Therapeutic activities;Therapeutic exercise;Balance training;Neuromuscular re-education;Patient/family education    PT Goals (Current goals can be found in the Care Plan section)  Acute Rehab PT Goals Patient Stated Goal: to get home PT Goal  Formulation: With patient/family Time For Goal Achievement: 03/01/20    Frequency Min 3X/week   Barriers to discharge   home with family to assist    Co-evaluation               AM-PAC PT "6 Clicks" Mobility  Outcome Measure Help needed turning from your back to your side while in a flat bed without using bedrails?: None Help needed moving from lying on your back to sitting on the side of a flat bed without using bedrails?: A Little Help needed moving to and from a bed to a chair (including a wheelchair)?: A Little Help needed standing up from a chair using your arms (e.g., wheelchair or bedside chair)?: A Little Help needed to walk in hospital room?: A Little Help needed climbing 3-5 steps with a railing? : A Lot 6 Click Score: 18    End of Session   Activity Tolerance: Patient limited by fatigue;Treatment limited secondary to medical complications (Comment) Patient left: in chair;with call bell/phone within reach Nurse Communication: Mobility status PT Visit Diagnosis: Unsteadiness on feet (R26.81);Other abnormalities of gait and mobility (R26.89)    Time: 1720-1746 PT Time Calculation (min) (ACUTE ONLY): 26 min   Charges:   PT Evaluation $PT Eval Moderate Complexity: 1 Mod PT Treatments $Gait Training: 8-22 mins       Ramond Dial 02/16/2020, 11:47 PM  Mee Hives, PT MS Acute Rehab Dept. Number: [place]

## 2020-02-16 NOTE — Care Management (Signed)
[id number] 02-16-20 Case Manager reached out to [facility] in regards to wound and possible wound VAC for home. Orders for wound VAC has been placed on the shadow chart if the plan is for home with wound VAC once stable. Case Manager reached out to liaison [place] with [id number] to assist if the plan is for home with a wound vac. No consult has been received at this time for the [facility] team. Case Manager will continue to follow for additional transition of care needs. Covering Case Manager will follow and if order for [facility] is written it will need to be faxed to [place] at [phone].  Bethena Roys, RN,BSN Case Manager

## 2020-02-16 NOTE — Progress Notes (Signed)
Family Medicine Teaching Service Daily Progress Note Intern Pager: [phone]  Patient name: Alexis Avila Medical record number: [id number] Date of birth: Dec 29, 1980 Age: 39 y.o. Gender: female  Primary Care Provider: [name], MD Consultants: GI and surgery Code Status: FULL  Pt Overview and Major Events to Date:  Admitted 7/28 Ex- Lap 8/2  Assessment and Plan:  Alexis Avila a 39 y.o.femalepresenting with 5-day history epigastric abdominal pain, found to havepartialSBOApparently secondary topancreatic pseudocyst.Also found to have marginal ulcer on EGD.PMH is significant forGERD,pancreatitisand prior Roux n Y surgery for besity.   Sepsis,likelysource bowelwith possibleperforation- acute, worsening Patient meets criteriafor sepsis continues to have tachycardia,  and a possible source. Patient's bowel was possiblyperforated with IR aspiration of what was believed to be a pseudocyst per GI and surgery; area aspiratednow believed to potentiallybe small bowel, per GI and surgery notes and discussion.  Patient underwent ex lap and per surgeon there is a possible hematoma/abscess located in the abdomen that was drained.Patient will continue Metronidazole and Ceftriaxone due to fever, tachycardia, and tachypnea. Patient was placed on ceftriaxone rather than zosyn due to concern for cross-reaction with PCN allergy.  WBC 20.9. Patient repeat UA had moderate leukocytes, proteinuria, and few bacteria. LDH elevated at 227. -  Progressivecare - Continue metronidazole and ceftriaxone -F/U postop surgery recommendations -f/u Urine gram stain, and repeat Ucx  Gastric ulcers- acute, improving EGD showed large marginal ulcer. Biopsies of ulcer returned:Inflammation but negative for H. Pylori. -ContinueProtonix 40 BID for 8 weeks  -ContinueSucralfate 1g ac/qh  -per surgery,NPO   Small bowel loop possible perforation,acute- worsening Patient underwent  surgical procedure 8/2.  Dr. [name] also surgeon performed exploratory laparotomy, lysis of adhesions, drainage of intra-abdominal hematoma/abscess, partial resection of Roux limb, insertion of 18 French gastrotomy tube in the gastric remnant.  KUB 8/2, found no bowel obstruction or free air.  Moderate stool in colon.  WBC 16.9 with elevated neutrophils, Hgb 7.7.  K+ 3.5, Na 135. Surgery recommends allowing patient to ambulate in the halls, allows bariatric clears for diet, NO PO meds, transfuse if <7. LDH found to be 227. - f/u surgery recomendations -Vitals per floor protocol -A.m. CMP/CBC -Continue Zofranas neededfor nausea  -Con'tLovenoxfor DVT prophylaxis,  - Scheduled Miralax -Per GI recommendationKeep K >4 and Mg >2 for bowel function -Continueabx: ceftriaxoneday #3/4, Metronidazoleday #3/4 -  Continue Morphine 2mg  q 2hrs PRN -f/u H&H - Foley removed    Elevated alkaline phosphatase ALP1,066> 920>813>823>429,AST 29>64>118>106>42>19, ALT 55>61>71>80>61>26. Patient endorsed daily BM's prior to ex-lap. - miralax scheduled -Continue to monitor postop  Shortness of breath- acute, worsening Patientendorses shortness of breath when walking. CXR7/28,was significant for mild left basilar atelectasis.There is decreased air movement on the lower left lobe.   - cont to monitor. Symptoms are likely related to increasing abdominal pain and "splinting" of deep inspiration. No cough. No SOB at rest. -  Continue Robitussin DM   Recent diagnosis of UTI-resolved UCx prior to ex lap, 8/2 had multiple species present, suggest recollection.  Patient has been urinating well does not report increased frequency or pain with urination while hospitalized. Foley inserted and is having red-orange output. - UA ordered w/ gram stain and culture - Foley removed  Hypokalemia- improved Mild Hyponatremia Most recentK+ 3.5.NA 135.  Patient endorsed regular BM prior to ex  lap. - Per GI keep K>4 and Mg>2 for Bowel function, due to regular BM will not replenish K+ to 4 at this time. -AM CMP - Will continue to monitor - elevate legs but  make sure bag is bile bag is down for gravity drain  Normocytic anemiahistory of B12 deficiency anemias/p gastric bypass Am Hgb7.7. We will repeat @ 1400.Last hemoglobin in chart, prior to hospitalization wason 07/20/2019 was 10.6.B12 261, Folate 8.9. -Transfusion threshold of 7 due to lack of CAD/PAD comorbidities -Follow-up morning CBC  Hyperglycemia AM BGL 111. 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 Etiology of heavy alcohol use. She has since cut back, now drinking twice a week with reported 3-4 drinks per episode. Drink of choice white wine or vodka. Last drink 2 weeks ago.Most recent CIWA=0. Labs as above. -Discontinue CIWAs - Educate patient on alcoholuse and adverse effects including effect ongastric ulcers  GERD Currently no complaints of heartburn. -Protonix40 BID for 8 weeks FEN/GI: Clear, NO PO meds PPx: Lovenox, scds  Disposition: Home  Subjective:  Patient had no overnight events.  Patient does not report any significant pain, but does feel fluid overloaded.  Patient does not report SLB or chest pain.  Objective: Temp:  [98 F (36.7 C)-99 F (37.2 C)] 99 F (37.2 C) (08/04 0550) Pulse Rate:  [112-122] 112 (08/04 0550) Resp:  [20-26] 26 (08/03 1943) BP: (114-140)/(65-77) 123/65 (08/04 0550) SpO2:  [98 %-99 %] 98 % (08/04 0550) Physical Exam: General: Lying in bed post wound care, NAD Cardiovascular: Tachycardic with regular rhythm, no murmur detected Respiratory: Clear apical and lateral lobes, no extra work of breathing noted Abdomen: Wound VAC reapplied, appeared to be healing well, high output into G tube, JP drain not bilious Extremities: 2+ pitting edema to the shin  bilaterally  Laboratory: Recent Labs  Lab 02/15/20 0514 02/15/20 1613 02/16/20 0457  WBC 16.9* 22.2* 20.5*  HGB 8.3* 8.6* 7.7*  HCT 27.7* 28.5* 24.8*  PLT 292 297 265   Recent Labs  Lab 02/12/20 0243 02/12/20 0243 02/13/20 0326 02/13/20 0326 02/14/20 0105 02/15/20 0514 02/16/20 0457  NA 136   < > 133*   < > 134* 134* 135  K 3.3*   < > 3.6   < > 3.6 3.7 3.5  CL 108   < > 108   < > 111 108 106  CO2 20*   < > 19*   < > 17* 20* 21*  BUN 5*   < > <5*   < > 5* 7 5*  CREATININE 0.61   < > 0.59   < > 0.62 0.63 0.68  CALCIUM 9.4   < > 8.8*   < > 8.4* 8.0* 7.8*  PROT 6.2*  --  5.6*  --   --   --  5.2*  BILITOT 2.8*  --  0.9  --   --   --  1.4*  ALKPHOS 813*  --  823*  --   --   --  429*  ALT 80*  --  61*  --   --   --  26  AST 106*  --  42*  --   --   --  19  GLUCOSE 111*   < > 102*   < > 131* 137* 111*   < > = values in this interval not displayed.      Imaging/Diagnostic Tests: DG Chest 2 View  Result Date: 02/15/2020 CLINICAL DATA:  Shortness of breath. EXAM: CHEST - 2 VIEW COMPARISON:  Chest x-ray 02/09/2020. FINDINGS: Mediastinum and hilar structures normal. Heart size normal. Low lung volumes with bibasilar atelectasis/infiltrates. Rounded density noted  over the left base. This could represent atelectasis or a fissural fluid pseudotumor. To exclude mass lesion follow-up exam suggested to demonstrate resolution. No pneumothorax. IMPRESSION: 1.  Low lung volumes with bibasilar atelectasis/infiltrate. 2. Rounded density noted over the left base. This could represent atelectasis and or fissural fluid pseudotumor. To exclude mass lesion follow-up exam suggested to demonstrate resolution. Electronically Signed   By: Marcello Moores  Register   On: 02/15/2020 09:36    Freida Busman, MD 02/16/2020, 7:59 AM PGY-1, [place] Intern pager: [phone], text pages welcome

## 2020-02-17 LAB — AEROBIC/ANAEROBIC CULTURE W GRAM STAIN (SURGICAL/DEEP WOUND): Culture: NO GROWTH

## 2020-02-17 LAB — COMPREHENSIVE METABOLIC PANEL
ALT: 20 U/L (ref 0–44)
AST: 31 U/L (ref 15–41)
Albumin: 1.7 g/dL — ABNORMAL LOW (ref 3.5–5.0)
Alkaline Phosphatase: 417 U/L — ABNORMAL HIGH (ref 38–126)
Anion gap: 12 (ref 5–15)
BUN: <5 mg/dL — ABNORMAL LOW (ref 6–20)
CO2: 18 mmol/L — ABNORMAL LOW (ref 22–32)
Calcium: 7.7 mg/dL — ABNORMAL LOW (ref 8.9–10.3)
Chloride: 103 mmol/L (ref 98–111)
Creatinine, Ser: 0.67 mg/dL (ref 0.44–1.00)
GFR calc Af Amer: >60 mL/min (ref >60)
GFR calc non Af Amer: >60 mL/min (ref >60)
Glucose, Bld: 99 mg/dL (ref 70–99)
Potassium: 3.7 mmol/L (ref 3.5–5.1)
Sodium: 133 mmol/L — ABNORMAL LOW (ref 135–145)
Total Bilirubin: 1.8 mg/dL — ABNORMAL HIGH (ref 0.3–1.2)
Total Protein: 4.8 g/dL — ABNORMAL LOW (ref 6.5–8.1)

## 2020-02-17 LAB — HEMOGLOBIN AND HEMATOCRIT, BLOOD
HCT: 24.7 % — ABNORMAL LOW (ref 36.0–46.0)
Hemoglobin: 7.8 g/dL — ABNORMAL LOW (ref 12.0–15.0)

## 2020-02-17 LAB — PREPARE RBC (CROSSMATCH)

## 2020-02-17 LAB — SAVE SMEAR(SSMR), FOR PROVIDER SLIDE REVIEW

## 2020-02-17 MED ORDER — OXYCODONE HCL 5 MG PO TABS
5.0000 mg | ORAL_TABLET | ORAL | Status: DC | PRN
  Administered 2020-02-18 – 2020-02-19 (×3): 10 mg via ORAL
  Filled 2020-02-17 (×3): qty 2

## 2020-02-17 MED ORDER — SUCRALFATE 1 GM/10ML PO SUSP
1.0000 g | Freq: Three times a day (TID) | ORAL | Status: DC
  Administered 2020-02-17 – 2020-02-18 (×4): 1 g via ORAL
  Filled 2020-02-17 (×5): qty 10

## 2020-02-17 MED ORDER — ACETAMINOPHEN 500 MG PO TABS: 1000.0000 mg | ORAL_TABLET | Freq: Three times a day (TID) | ORAL | Status: DC

## 2020-02-17 MED ORDER — PANTOPRAZOLE SODIUM 40 MG PO PACK
40.0000 mg | PACK | Freq: Two times a day (BID) | ORAL | Status: DC
  Administered 2020-02-17 – 2020-02-18 (×3): 40 mg via ORAL
  Filled 2020-02-17 (×3): qty 20

## 2020-02-17 MED ORDER — ACETAMINOPHEN 160 MG/5ML PO SOLN
1000.0000 mg | Freq: Three times a day (TID) | ORAL | Status: DC
  Administered 2020-02-17 – 2020-02-23 (×20): 1000 mg via ORAL
  Filled 2020-02-17 (×20): qty 40.6

## 2020-02-17 MED ORDER — SODIUM CHLORIDE 0.9% IV SOLUTION: Freq: Once | INTRAVENOUS | Status: AC

## 2020-02-17 MED ORDER — ENSURE SURGERY PO LIQD
237.0000 mL | Freq: Two times a day (BID) | ORAL | Status: DC
  Administered 2020-02-17 – 2020-02-20 (×3): 237 mL via ORAL
  Filled 2020-02-17 (×10): qty 237

## 2020-02-17 NOTE — Progress Notes (Signed)
Physical Therapy Treatment Patient Details Name: Alexis Avila MRN: [id number] DOB: 11/18/1980 Today's Date: 02/17/2020    History of Present Illness 39 yo female with history of gastric bypass surgery was admitted after having inability to eat, SOB and had UTI.  Went home and returned with dizziness, weakness and gastric pain.  Has partial SBO, low electrolytes, elevated BS, and was seen on 7/29 for enteroscopy, with sepsis symptoms afterward.  Returned to OR for suspected perforation of SB loop, had laparotomy with G-tube on 02/14/20, lysis of adhesions and drainage of IAA done.  Referred to PT.      PT Comments    Pt is progressing well towards goals. She increased ambulation distance in hallway today. Gait is slow and guarded but overall steady. Educated on elevating LEs to decrease swelling. HR in the 120s with gait. Will continue to follow acutely for mobility progression.     Follow Up Recommendations  Home health PT     Equipment Recommendations  None recommended by PT    Recommendations for Other Services       Precautions / Restrictions Precautions Precautions: Fall Precaution Comments: monitor her vitals    Mobility  Bed Mobility               General bed mobility comments: in chair on arrival  Transfers Overall transfer level: Independent                  Ambulation/Gait Ambulation/Gait assistance: Min guard Gait Distance (Feet): 240 Feet Assistive device: IV Pole Gait Pattern/deviations: Step-through pattern;Wide base of support;Trunk flexed Gait velocity: reduced   General Gait Details: Pt ambulating with IV pole and assist for line management. Trunk flexed to guard surgical site. HR in the 120s during ambulation.   Stairs             Wheelchair Mobility    Modified Rankin (Stroke Patients Only)       Balance Overall balance assessment: Needs assistance Sitting-balance support: Feet supported Sitting balance-Leahy Scale: Fair        Standing balance-Leahy Scale: Fair                              Cognition Arousal/Alertness: Awake/alert Behavior During Therapy: WFL for tasks assessed/performed Overall Cognitive Status: Within Functional Limits for tasks assessed                                        Exercises      General Comments General comments (skin integrity, edema, etc.): LEs swollen. Educated on elevating to decrease swelling.       Pertinent Vitals/Pain Pain Assessment: No/denies pain (LE feel heavy)    Home Living                      Prior Function            PT Goals (current goals can now be found in the care plan section) Acute Rehab PT Goals Patient Stated Goal: to get home PT Goal Formulation: With patient/family Time For Goal Achievement: 03/01/20 Progress towards PT goals: Progressing toward goals    Frequency    Min 3X/week      PT Plan Current plan remains appropriate    Co-evaluation              AM-PAC PT "  6 Clicks" Mobility   Outcome Measure  Help needed turning from your back to your side while in a flat bed without using bedrails?: None Help needed moving from lying on your back to sitting on the side of a flat bed without using bedrails?: A Little Help needed moving to and from a bed to a chair (including a wheelchair)?: A Little Help needed standing up from a chair using your arms (e.g., wheelchair or bedside chair)?: A Little Help needed to walk in hospital room?: A Little Help needed climbing 3-5 steps with a railing? : A Lot 6 Click Score: 18    End of Session Equipment Utilized During Treatment: Gait belt (belt under axilla to avoid wound) Activity Tolerance: Patient tolerated treatment well Patient left: in chair;with call bell/phone within reach Nurse Communication: Mobility status PT Visit Diagnosis: Unsteadiness on feet (R26.81);Other abnormalities of gait and mobility (R26.89)     Time:  4818-5909 PT Time Calculation (min) (ACUTE ONLY): 31 min  Charges:  $Gait Training: 23-37 mins                     Benjiman Core, [place] Pager [phone] Acute Rehab   Allena Katz 02/17/2020, 2:32 PM

## 2020-02-17 NOTE — Progress Notes (Signed)
Occupational Therapy Evaluation Patient Details Name: Alexis Avila MRN: [id number] DOB: [birth year] Today's Date: 02/17/2020    History of Present Illness 39 yo female with history of gastric bypass surgery was admitted after having inability to eat, SOB and had UTI.  Went home and returned with dizziness, weakness and gastric pain.  Has partial SBO, low electrolytes, elevated BS, and was seen on 7/29 for enteroscopy, with sepsis symptoms afterward.  Returned to OR for suspected perforation of SB loop, had laparotomy with G-tube on 02/14/20, lysis of adhesions and drainage of IAA done.  Referred to OT.   Clinical Impression   Pt admitted for above and limited by increased pain and decreased activity tolerance, ROM/strength, and endurance. Prior to hospitalization, pt was independent with all ADLs/IADLs including driving, working full-time for [employer], and ambulating independently. Pt lives with her boyfriend who is available to assist PRN. Today, pt received in bed with HOB elevated, agreeable to OT eval. Pt's boyfriend present halfway through. Pt was Mod I for bed mobility, required supervision-min guard for functional transfers and ambulation throughout room without mobility device. Pt requires max assist for LB self-care and setup-min assist for UB self-care. Educated pt on abdominal precautions (limiting bending), log roll technique, deep breathing, edema management (elevating), and potential use for LB dressing AE. Pt is functioning below baseline. Pt would benefit from continued skilled acute OT services to maximize independence with ADLs/IADLs and functional mobility. Will continue to follow pt acutely as able.    Follow Up Recommendations  No OT follow up;Supervision - Intermittent    Equipment Recommendations  Tub/shower seat;3 in 1 bedside commode;Other (comment) (wide bedside commode, reacher/sock aide, grab bars)    Recommendations for Other Services  (None)     Precautions /  Restrictions Precautions Precautions: Fall;Other (comment) (abdominal wound vac, JP drain, g-tube, tele) Precaution Comments: monitor her vitals Restrictions Weight Bearing Restrictions: No Other Position/Activity Restrictions: limit bending at abdomen      Mobility Bed Mobility Overal bed mobility: Modified Independent   General bed mobility comments: transitioned from semi-reclined>sitting EOB with HOB elevated and using bed railings  Transfers Overall transfer level: Needs assistance Equipment used: None Transfers: Sit to/from Stand Sit to Stand: Supervision   General transfer comment: pt stood from bed with supervision; assist for line management; min guard for recliner/bedside commode transfer to ensure safety    Balance Overall balance assessment: Needs assistance Sitting-balance support: Feet supported Sitting balance-Leahy Scale: Good     Standing balance support: No upper extremity supported;During functional activity Standing balance-Leahy Scale: Fair Standing balance comment: able to stand at sink for extended amount of time to perform grooming activities      ADL either performed or assessed with clinical judgement   ADL Overall ADL's : Needs assistance/impaired Eating/Feeding: Independent   Grooming: Min guard;Set up;Standing;Wash/dry hands;Wash/dry face;Oral care Grooming Details (indicate cue type and reason): pt tolerated standing at sink for grooming requiring min guard for safety and line management; pt able to independently manage grooming materials once setup Upper Body Bathing: Minimal assistance;Sitting;Standing   Lower Body Bathing: Maximal assistance;Sitting/lateral leans;Sit to/from stand   Upper Body Dressing : Set up;Sitting;Standing   Lower Body Dressing: Total assistance;Sitting/lateral leans Lower Body Dressing Details (indicate cue type and reason): pt requires total assist to don/doff socks sitting EOB; educated pt on potential use of LB  dressing AE (sock aide and reacher)  Toilet Transfer: Min guard;+2 for safety/equipment;Ambulation;BSC Toilet Transfer Details (indicate cue type and reason): pt able to transfer on/off  standard bedside commode with min guard; properly reached back before sitting; discussed purchasing options for wide bedside commode for future use Toileting- Clothing Manipulation and Hygiene: Minimal assistance;Sitting/lateral lean;Sit to/from stand   Tub/ Banker:  (Not assessed); from a clinical judgement standpoint pt would require supervision/min guard   Functional mobility during ADLs: Min guard;+2 for safety/equipment;Supervision/safety General ADL Comments: pt's HR elevated to 122bpm with functional exertion/OOB ADLs; educated pt on deep breathing and rest breaks; no LOB during ADL mobility; pt concerned about size of bedside commode (requesting wide)     Vision Baseline Vision/History: Wears glasses (Contacts mostly and glasses sometimes) Wears Glasses:  (occasionally) Patient Visual Report: No change from baseline              Pertinent Vitals/Pain Pain Assessment: 0-10 Pain Score: 8  Pain Location: abdomen 8/10; c/o chest tightness that radiates to L lateral back after eating Pain Descriptors / Indicators: Aching;Tightness;Dull;Discomfort;Guarding Pain Intervention(s): Monitored during session;Repositioned;Patient requesting pain meds-RN notified     Hand Dominance Right   Extremity/Trunk Assessment Upper Extremity Assessment Upper Extremity Assessment: Overall WFL for tasks assessed   Lower Extremity Assessment Lower Extremity Assessment: Overall WFL for tasks assessed   Cervical / Trunk Assessment Cervical / Trunk Assessment: Normal   Communication Communication Communication: No difficulties   Cognition Arousal/Alertness: Awake/alert Behavior During Therapy: WFL for tasks assessed/performed Overall Cognitive Status: Within Functional Limits for tasks assessed          General Comments  BLEs swollen- educated on edema management (elevating); educated on protecting abdomen (limiting bending)      Home Living Family/patient expects to be discharged to:: Private residence Living Arrangements: Spouse/significant other Available Help at Discharge: Available PRN/intermittently;Other (Comment) (boyfriend is available PRN ) Type of Home: Apartment Home Access: Level entry   Entrance Stairs-Rails: None Home Layout: One level     Bathroom Shower/Tub: Teacher, early years/pre: Standard Bathroom Accessibility: Yes How Accessible: Accessible via wheelchair;Accessible via walker Home Equipment: None      Prior Functioning/Environment Level of Independence: Independent        Comments: reports being independent with all ADLs/IADLs including driving, grocery shopping, medication management, working full-time for [employer] (from home), and ambulating independently        OT Problem List: Decreased strength;Decreased range of motion;Decreased activity tolerance;Impaired balance (sitting and/or standing);Decreased knowledge of use of DME or AE;Decreased knowledge of precautions;Pain;Increased edema      OT Treatment/Interventions: Self-care/ADL training;Therapeutic exercise;Energy conservation;DME and/or AE instruction;Therapeutic activities;Patient/family education;Neuromuscular education    OT Goals(Current goals can be found in the care plan section) Acute Rehab OT Goals Patient Stated Goal: to get home without pain OT Goal Formulation: With patient Time For Goal Achievement: 03/02/20 Potential to Achieve Goals: Good  OT Frequency: Min 2X/week    AM-PAC OT "6 Clicks" Daily Activity     Outcome Measure Help from another person eating meals?: None Help from another person taking care of personal grooming?: A Little Help from another person toileting, which includes using toliet, bedpan, or urinal?: A Little Help from another person  bathing (including washing, rinsing, drying)?: A Lot Help from another person to put on and taking off regular upper body clothing?: A Little Help from another person to put on and taking off regular lower body clothing?: A Lot 6 Click Score: 17   End of Session Equipment Utilized During Treatment: Gait belt Nurse Communication: Mobility status  Activity Tolerance: Patient tolerated treatment well Patient left: in chair;with call bell/phone  within reach;with family/visitor present  OT Visit Diagnosis: Muscle weakness (generalized) (M62.81);Pain;Unsteadiness on feet (R26.81) Pain - Right/Left:  (abdomen and chest) Pain - part of body:  (abdomen and chest)                Time: 5248-1859 OT Time Calculation (min): 47 min Charges:  OT General Charges $OT Visit: 1 Visit OT Evaluation $OT Eval Moderate Complexity: 1 Mod OT Treatments $Self Care/Home Management : 8-22 mins $Therapeutic Activity: 8-22 mins  Michel Bickers, OTR/L [facility]hab [facility] [phone]  Francesca Jewett 02/17/2020, 7:07 PM

## 2020-02-17 NOTE — Progress Notes (Addendum)
FMTS Attending Daily Note: [name], MD  Team Pager [phone] Pager [phone]  I have seen and examined this patient, reviewed their chart. I have discussed this patient with the resident. I agree with the resident's findings, assessment and care plan.  My edits are below.    Family Medicine Teaching Service Daily Progress Note Intern Pager: [phone]  Patient name: Alexis Avila Medical record number: [id number] Date of birth: 1981/06/14 Age: 39 y.o. Gender: female  Primary Care Provider: [name], MD Consultants: GI, Surgery, Wound care Code Status: Full  Pt Overview and Major Events to Date:  Admitted 7/28 7/31- CT guided aspiration of fluid from LUQ by IR  8/2- Exploratory laparotomy, lysis of adhesion, G-tube placement in gastric remnant, Roux limb resection, and drainage of LUQ hematome with  drain placed 8/4- Wound vac changed with IV pain medications   Assessment and Plan: Kinnedy Boltonis a 39 y.o.femalepresenting with 5-day history epigastric abdominal pain, found to havepartialSBO. After aspiration of suspected pseudocyst, patient developed worsening pain and sepsis. On 8/2 went to OR for lysis of adhesions (2 hours) found to have intra-abdominal hematoma and abscess. Now s/p G-tube placement in gastric pouch, resection of Roux limb. PMH significant for obesity, Roux-en-Y gastri bypass, chronic anemia   Sepsis,likelysource bowelwith possibleperforation- acute, improving Afebrile, HR, and WBC improving. Allergy to Piperacilling. WBC 14.6 downtrending.Patient repeat UA had moderate leukocytes, proteinuria, and few bacteria. Bcx has had ngtd.  - [facility] will transfer to telemetry as able on 8/6.  - Continue metronidazole and ceftriaxone -F/U postop surgery recommendations -f/u Urine gram stain, and repeat Ucx - JP drain output not  bilious  - G tube output bilious  Gastric ulcers- acute, improving EGD showed large marginal ulcer. Biopsies  of ulcer returned:Inflammation but negative for H. Pylori. -ContinueProtonix 40 BID for 8 weeks  -per surgery,Clear liquids no meds PO - Restart Carafate when taking PO   Small bowel obstruction, s/p aspiration of suspected pseudocyst by IR, then exploratory laparotomy with lysis of adhesions, resection of Roux limb, procedures as below.  Patient underwent surgical procedure 8/2. Dr. [name] also surgeon performedexploratory laparotomy, lysis of adhesions, drainage of intra-abdominal hematoma/abscess, partial resection of Roux limb, insertion of 18 French gastrotomy tube in the gastric remnant. KUB 8/2, found no bowel obstruction or free air. Moderate stool in colon. WBC 16.9 with elevated neutrophils, Hgb 6.3. K+ 3.7, Na133. Patient AM Hgb 6.3, will transfuse. Per surgery they will attempt to clamp the G tube and see how tolerates.  -f/u surgery recomendations -Vitals q4h  -A.m. CMP/CBC -Continue Zofranas neededfor nausea  -DiscontinueLovenoxand continue SCDs for DVT prophylaxis,  - Scheduled Miralax -Per GI recommendationKeep K >4 and Mg >2 for bowel function -Continueabx: ceftriaxoneday #4/5, Metronidazoleday #4/5, today is post-op day 3/4 - Continue Morphine 2mg  q 1hr PRN, transition to oral as able.    Elevated alkaline phosphatase- improving ALP1,066> 920>813>823>429>417,AST 29>64>118>106>42>19>31, ALT 55>61>71>80>61>26>20. AST and ALT have been WNL.  - miralax scheduled -Continue to monitor postop  Recent diagnosis of UTI-stable UCxprior to ex lap, 8/2 had multiple species present, suggest recollection. Patient has been urinating well does not report increased frequency or pain with urination while hospitalized. UA repeat had moderate leukocytes, amber color, and consistent proteinuria. No concern due to f/u gram stain and culture because patient is already on abx.   Hypokalemia- improved Mild Hyponatremia Most recentK+ 3.7.NA  133.Patient endorsed regular BM prior to ex lap. - Per GI keep K>4 and Mg>2 for Bowel function, due to regular BM  will not replenish K+ to 4 at this time. -AM CMP - Will continue to monitor - elevate legs but make sure bag is bile bag is down for gravity drain  Normocytic anemiahistory of B12 deficiency anemias/p gastric bypass- acute on chronic, worsening Am Hgb6.3, received 1uPRBc. This Hgb of 6.3 is likely 2/2 acute blood loss and/or due to patient being hypervolemic from mIVFs.Last hemoglobin in chart, prior to hospitalization wason 07/20/2019 was 10.6.B12 261, Folate 8.9. -Transfusion threshold of 7 due to lack of CAD/PAD comorbidity - Transfuse X1 on 8/5, repeat CBC in AM  -Follow-up morning CBC -f/u blood smear - f/u haptoglobin  Hyperglycemia- improving AM BGL 99. 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 Etiology of heavy alcohol use. She has since cut back, now drinking twice a week with reported 3-4 drinks per episode. Drink of choice white wine or vodka. Last drink 2 weeks ago.Most recent CIWA=0. Labs as above. -Discontinue CIWAs - Educate patient on alcoholuse and adverse effects including effect ongastric ulcers  GERD- chronic, stable Currently no complaints of heartburn. -Protonix40 BID for 8 weeks  FEN/GI: Bari full liquids, protein shake, PPx: SCDs, Hold Lovenox  Disposition: Home Health OT  Subjective:  AM Hgb was 6.3, with transfusion level of 7.0. Patient was transfused.  Patient does not report any chest pain or SOB, but continues to report feeling fluid overloaded, "my legs have never been this big."  Patient reports that she told Dr. Dois Davenport team that she would really appreciate being able to turn off MIVF for just a short time due to feeling overloaded.  Otherwise patient feels stable.  Reports ambulating well with therapy and also reports that  she needs a larger bedside commode.  Objective: Temp:  [98 F (36.7 C)-99.7 F (37.6 C)] 98.5 F (36.9 C) (08/05 0421) Pulse Rate:  [97-114] 97 (08/05 0421) Resp:  [18-33] 18 (08/05 0421) BP: (111-128)/(58-78) 119/58 (08/05 0421) SpO2:  [96 %-100 %] 99 % (08/05 0421) Physical Exam: General: NAD, patient resting in bed Cardiovascular: Regular heart rate and rhythm, no murmur detected Respiratory: Equal and clear bilaterally with auscultation of apical and lateral lungs.  No extra work of breathing noted Abdomen: Wound VAC in place Extremities: Distal pulses intact bilaterally, 3+ pitting edema to the shin.  Laboratory: Recent Labs  Lab 02/15/20 1613 02/15/20 1613 02/16/20 0457 02/16/20 1321 02/17/20 0458  WBC 22.2*  --  20.5*  --  14.6*  HGB 8.6*   < > 7.7* 7.4* 6.3*  HCT 28.5*   < > 24.8* 24.9* 20.1*  PLT 297  --  265  --  231   < > = values in this interval not displayed.   Recent Labs  Lab 02/13/20 0326 02/14/20 0105 02/15/20 0514 02/16/20 0457 02/17/20 0458  NA 133*   < > 134* 135 133*  K 3.6   < > 3.7 3.5 3.7  CL 108   < > 108 106 103  CO2 19*   < > 20* 21* 18*  BUN <5*   < > 7 5* <5*  CREATININE 0.59   < > 0.63 0.68 0.67  CALCIUM 8.8*   < > 8.0* 7.8* 7.7*  PROT 5.6*  --   --  5.2* 4.8*  BILITOT 0.9  --   --  1.4* 1.8*  ALKPHOS 823*  --   --  429* 417*  ALT 61*  --   --  26 20  AST 42*  --   --  19 31  GLUCOSE 102*   < > 137* 111* 99   < > = values in this interval not displayed.       Imaging/Diagnostic Tests: No results found. None in past 24 hrs.  Freida Busman, MD 02/17/2020, 6:23 AM PGY-1, [place] Intern pager: [phone], text pages welcome

## 2020-02-17 NOTE — Progress Notes (Signed)
Pt c/o mid upper chest pain and abdominal pain 6/10 after cramping the G tube.  States she can tolerate it for now.  Idolina Primer, RN

## 2020-02-17 NOTE — Progress Notes (Addendum)
3 Days Post-Op  Subjective: CC: Patient reports most of her pain has resolved. She has a "pinch" of pain in her epigastrium and around her g-tube/drain sites. She is tolerating bariatric clears and had 2 jellos and 2 soups yesterday without increased pain, n/v. She is unsure of flatus. No BM. Mobilized with PT yesterday who recommends HH. She is voiding without difficulty. Feels swollen in her lower extremities.   Objective: Vital signs in last 24 hours: Temp:  [98 F (36.7 C)-99.7 F (37.6 C)] 98.5 F (36.9 C) (08/05 0421) Pulse Rate:  [97-114] 97 (08/05 0421) Resp:  [18-33] 18 (08/05 0421) BP: (111-128)/(58-78) 119/58 (08/05 0421) SpO2:  [96 %-100 %] 99 % (08/05 0421) Last BM Date: 02/14/20  Intake/Output from previous day: 08/04 0701 - 08/05 0700 In: 3377.7 [P.O.:720; I.V.:1457.7; IV Piggyback:1200] Out: 2980 [Urine:1300; Drains:1680] Intake/Output this shift: No intake/output data recorded.  PE: Gen:  Alert, NAD, pleasant Card: Tachycardic with regular rhythm  Pulm:  CTAB, no W/R/R, effort normal.  Abd: Soft, mild distension, tenderness of the epigastrium and to the around g-tube. + BS. Midline wound with wound vac in place. Wound vac drainage bloody. JP in place with Serous output in cannister. 55cc/24 hours. G-tube to gravity with bilious output in bag. 4.625L/24 hours. Ext:  1-2+ edema of lower extremities. Calves equal in size. No calf tenderness.  Psych: A&Ox3  Skin: no rashes noted, warm and dry  Lab Results:  Recent Labs    02/16/20 0457 02/16/20 0457 02/16/20 1321 02/17/20 0458  WBC 20.5*  --   --  14.6*  HGB 7.7*   < > 7.4* 6.3*  HCT 24.8*   < > 24.9* 20.1*  PLT 265  --   --  231   < > = values in this interval not displayed.   BMET Recent Labs    02/16/20 0457 02/17/20 0458  NA 135 133*  K 3.5 3.7  CL 106 103  CO2 21* 18*  GLUCOSE 111* 99  BUN 5* <5*  CREATININE 0.68 0.67  CALCIUM 7.8* 7.7*   PT/INR No results for input(s): LABPROT, INR  in the last 72 hours. CMP     Component Value Date/Time   NA 133 (L) 02/17/2020 0458   K 3.7 02/17/2020 0458   CL 103 02/17/2020 0458   CO2 18 (L) 02/17/2020 0458   GLUCOSE 99 02/17/2020 0458   BUN <5 (L) 02/17/2020 0458   CREATININE 0.67 02/17/2020 0458   CALCIUM 7.7 (L) 02/17/2020 0458   PROT 4.8 (L) 02/17/2020 0458   ALBUMIN 1.7 (L) 02/17/2020 0458   AST 31 02/17/2020 0458   ALT 20 02/17/2020 0458   ALKPHOS 417 (H) 02/17/2020 0458   BILITOT 1.8 (H) 02/17/2020 0458   GFRNONAA >60 02/17/2020 0458   GFRAA >60 02/17/2020 0458   Lipase     Component Value Date/Time   LIPASE 113 (H) 02/09/2020 1226       Studies/Results: DG Chest 2 View  Result Date: 02/15/2020 CLINICAL DATA:  Shortness of breath. EXAM: CHEST - 2 VIEW COMPARISON:  Chest x-ray 02/09/2020. FINDINGS: Mediastinum and hilar structures normal. Heart size normal. Low lung volumes with bibasilar atelectasis/infiltrates. Rounded density noted over the left base. This could represent atelectasis or a fissural fluid pseudotumor. To exclude mass lesion follow-up exam suggested to demonstrate resolution. No pneumothorax. IMPRESSION: 1.  Low lung volumes with bibasilar atelectasis/infiltrate. 2. Rounded density noted over the left base. This could represent atelectasis and or fissural fluid pseudotumor.  To exclude mass lesion follow-up exam suggested to demonstrate resolution. Electronically Signed   By: Marcello Moores  Register   On: 02/15/2020 09:36    Anti-infectives: Anti-infectives (From admission, onward)   Start     Dose/Rate Route Frequency Ordered Stop   02/14/20 0800  metroNIDAZOLE (FLAGYL) IVPB 500 mg     Discontinue     500 mg 100 mL/hr over 60 Minutes Intravenous Every 8 hours 02/14/20 0131     02/14/20 0230  cefTRIAXone (ROCEPHIN) 2 g in sodium chloride 0.9 % 100 mL IVPB     Discontinue     2 g 200 mL/hr over 30 Minutes Intravenous Daily at bedtime 02/14/20 0130     02/14/20 0130  metroNIDAZOLE (FLAGYL) IVPB 500 mg         500 mg 100 mL/hr over 60 Minutes Intravenous  Once 02/14/20 0121 02/14/20 0235   02/12/20 0800  vancomycin (VANCOCIN) IVPB 1000 mg/200 mL premix        1,000 mg 200 mL/hr over 60 Minutes Intravenous To Radiology 02/11/20 1619 02/12/20 1105       Assessment/Plan Marginal ulcer - change to BID protonix crushed (not to be taken whole) and carafate.  ABL Anemia -  hgb 6.3 this AM. 1U PRBC ordered by primary team. F/u hgb after transfusion. Hold prophylactic Lovenox. Consider restarting when hgb stable.  Edema - +7.6L since admit. No calf tenderness. On RA. Decrease IVF. Monitor.   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 Left abdominal wall hematoma/abscess with possible perforation. Serosal tear, Roux limb - S/p exp lap, lysis of adhesions >2hrs, drainage of intra-abdominal hematoma/abscess, insertion of G tube in remnant stomach, partial resection/reconstruction of roux limb 8/2 Dr [name] - POD #3 - Adv to bariatric full liquids today, give oral meds - Maintain JP - Clamp G-tube in remnant stomach today. Please place to gravity for any increased abdominal pain, distension, n/v. Cannot use g tube for feeds since status of BP limb is unclear. Plan to do contrasted study thru G tube in a few days (if tolerates clamping or when output has decreased to <1L/24 hours). - Cont iv abx x 4 days post op - Pulm toilet, pulling 1000. 10x/hour - Mobilize, PT ordered. Rec HH - WOCN for wound vac change M/W/F  FEN - Bariactic fulls, IVF @ 104ml/hr, K 3.7 (goal > 4) ID - Rocephin/Flagyl. Cont iv abx x 4 days post op. Afebrile. WBC 14.6 VTE prophylaxis - scds, hold Lovenox for anemia  Foley - out Follow-Up - Dr. [name]   LOS: 7 days    Jillyn Ledger , [facility] Surgery 02/17/2020, 7:44 AM Please see Amion for pager number during day hours 7:00am-4:30pm

## 2020-02-17 NOTE — Progress Notes (Signed)
PT Cancellation Note  Patient Details Name: Alexis Avila MRN: [id number] DOB: [birth year]   Cancelled Treatment:    Reason Eval/Treat Not Completed: Medical issues which prohibited therapy. Pt's Hgb down to 6.3. Will check back as time allows and once levels have improved.    Allena Katz 02/17/2020, 9:40 AM

## 2020-02-18 ENCOUNTER — Inpatient Hospital Stay (HOSPITAL_COMMUNITY): Payer: BC Managed Care – PPO

## 2020-02-18 DIAGNOSIS — M7989 Other specified soft tissue disorders: Secondary | ICD-10-CM

## 2020-02-18 DIAGNOSIS — R600 Localized edema: Secondary | ICD-10-CM

## 2020-02-18 DIAGNOSIS — R52 Pain, unspecified: Secondary | ICD-10-CM

## 2020-02-18 DIAGNOSIS — R0602 Shortness of breath: Secondary | ICD-10-CM

## 2020-02-18 LAB — CBC
HCT: 23.4 % — ABNORMAL LOW (ref 36.0–46.0)
Hemoglobin: 7.4 g/dL — ABNORMAL LOW (ref 12.0–15.0)
MCH: 27.1 pg (ref 26.0–34.0)
MCHC: 31.6 g/dL (ref 30.0–36.0)
MCV: 85.7 fL (ref 80.0–100.0)
Platelets: 260 10*3/uL (ref 150–400)
RBC: 2.73 MIL/uL — ABNORMAL LOW (ref 3.87–5.11)
RDW: 20.2 % — ABNORMAL HIGH (ref 11.5–15.5)
WBC: 13.5 10*3/uL — ABNORMAL HIGH (ref 4.0–10.5)
nRBC: 2.1 % — ABNORMAL HIGH (ref 0.0–0.2)

## 2020-02-18 LAB — CBC WITH DIFFERENTIAL/PLATELET
Abs Immature Granulocytes: 0 10*3/uL (ref 0.00–0.07)
Basophils Absolute: 0 10*3/uL (ref 0.0–0.1)
Basophils Relative: 0 %
Eosinophils Absolute: 0 10*3/uL (ref 0.0–0.5)
Eosinophils Relative: 0 %
HCT: 24.1 % — ABNORMAL LOW (ref 36.0–46.0)
Hemoglobin: 7.3 g/dL — ABNORMAL LOW (ref 12.0–15.0)
Lymphocytes Relative: 7 %
Lymphs Abs: 1.1 10*3/uL (ref 0.7–4.0)
MCH: 25.9 pg — ABNORMAL LOW (ref 26.0–34.0)
MCHC: 30.3 g/dL (ref 30.0–36.0)
MCV: 85.5 fL (ref 80.0–100.0)
Monocytes Absolute: 0.6 10*3/uL (ref 0.1–1.0)
Monocytes Relative: 4 %
Neutro Abs: 13.6 10*3/uL — ABNORMAL HIGH (ref 1.7–7.7)
Neutrophils Relative %: 89 %
Platelets: 261 10*3/uL (ref 150–400)
RBC: 2.82 MIL/uL — ABNORMAL LOW (ref 3.87–5.11)
RDW: 20 % — ABNORMAL HIGH (ref 11.5–15.5)
WBC: 15.3 10*3/uL — ABNORMAL HIGH (ref 4.0–10.5)
nRBC: 2 % — ABNORMAL HIGH (ref 0.0–0.2)
nRBC: 3 /100 WBC — ABNORMAL HIGH

## 2020-02-18 LAB — COMPREHENSIVE METABOLIC PANEL
ALT: 18 U/L (ref 0–44)
AST: 20 U/L (ref 15–41)
Albumin: 1.8 g/dL — ABNORMAL LOW (ref 3.5–5.0)
Alkaline Phosphatase: 422 U/L — ABNORMAL HIGH (ref 38–126)
Anion gap: 13 (ref 5–15)
BUN: <5 mg/dL — ABNORMAL LOW (ref 6–20)
CO2: 20 mmol/L — ABNORMAL LOW (ref 22–32)
Calcium: 7.9 mg/dL — ABNORMAL LOW (ref 8.9–10.3)
Chloride: 102 mmol/L (ref 98–111)
Creatinine, Ser: 0.65 mg/dL (ref 0.44–1.00)
GFR calc Af Amer: >60 mL/min (ref >60)
GFR calc non Af Amer: >60 mL/min (ref >60)
Glucose, Bld: 108 mg/dL — ABNORMAL HIGH (ref 70–99)
Potassium: 2.8 mmol/L — ABNORMAL LOW (ref 3.5–5.1)
Sodium: 135 mmol/L (ref 135–145)
Total Bilirubin: 1.4 mg/dL — ABNORMAL HIGH (ref 0.3–1.2)
Total Protein: 5.6 g/dL — ABNORMAL LOW (ref 6.5–8.1)

## 2020-02-18 LAB — TYPE AND SCREEN
ABO/RH(D): A POS
Antibody Screen: NEGATIVE
Unit division: 0

## 2020-02-18 LAB — HAPTOGLOBIN: Haptoglobin: 348 mg/dL — ABNORMAL HIGH (ref 33–278)

## 2020-02-18 LAB — MAGNESIUM: Magnesium: 1.9 mg/dL (ref 1.7–2.4)

## 2020-02-18 LAB — BPAM RBC
Blood Product Expiration Date: 202108212359
ISSUE DATE / TIME: 202108050757
Unit Type and Rh: 6200

## 2020-02-18 MED ORDER — POTASSIUM CHLORIDE 10 MEQ/100ML IV SOLN
10.0000 meq | INTRAVENOUS | Status: AC
  Administered 2020-02-18 – 2020-02-19 (×4): 10 meq via INTRAVENOUS
  Filled 2020-02-18 (×4): qty 100

## 2020-02-18 MED ORDER — POTASSIUM CHLORIDE 10 MEQ/100ML IV SOLN: 10.0000 meq | INTRAVENOUS | Status: DC

## 2020-02-18 MED ORDER — ENOXAPARIN SODIUM 60 MG/0.6ML ~~LOC~~ SOLN
60.0000 mg | SUBCUTANEOUS | Status: DC
  Administered 2020-02-18: 60 mg via SUBCUTANEOUS
  Filled 2020-02-18: qty 0.6

## 2020-02-18 MED ORDER — POTASSIUM CHLORIDE 10 MEQ/100ML IV SOLN
10.0000 meq | INTRAVENOUS | Status: AC
  Administered 2020-02-18 (×5): 10 meq via INTRAVENOUS
  Filled 2020-02-18 (×5): qty 100

## 2020-02-18 MED ORDER — ONDANSETRON HCL 4 MG/2ML IJ SOLN: 4.0000 mg | Freq: Once | INTRAMUSCULAR | Status: DC | PRN

## 2020-02-18 MED ORDER — PANTOPRAZOLE SODIUM 40 MG IV SOLR
40.0000 mg | Freq: Two times a day (BID) | INTRAVENOUS | Status: DC
  Administered 2020-02-18 – 2020-02-20 (×5): 40 mg via INTRAVENOUS
  Filled 2020-02-18 (×5): qty 40

## 2020-02-18 MED ORDER — ASPIRIN 300 MG RE SUPP
300.0000 mg | Freq: Every day | RECTAL | Status: DC
  Filled 2020-02-18: qty 1

## 2020-02-18 MED ORDER — IOHEXOL 300 MG/ML  SOLN
100.0000 mL | Freq: Once | INTRAMUSCULAR | Status: AC | PRN
  Administered 2020-02-18: 100 mL via INTRAVENOUS

## 2020-02-18 NOTE — Progress Notes (Addendum)
CT personally reviewed No leak from roux limb. No free air  Surgical drain in approp location at/near L retroperitoneal fluid collection  Will resume bariatric clears as tolerated U/s LE negative  Updated pt via phone  Leighton Ruff. [name], MD, FACS General, Bariatric, & Minimally Invasive Surgery [facility], [place]

## 2020-02-18 NOTE — Progress Notes (Signed)
FPTS Interim Progress Note  S: Patient sitting up in bed, had just transferred from chair. Resumed on bariatric clears today. Patient had some chicken broth today, tolerated well without vomiting. Had a BM today. No concerns at this time.  O: BP 109/66 (BP Location: Left Arm)   Pulse 99   Temp 98.3 F (36.8 C) (Oral)   Resp 18   Ht 5\' 10"  (1.778 m)   Wt (!) 139.9 kg   LMP 02/03/2020   SpO2 98%   BMI 44.25 kg/m   Obese woman, mildly uncomfortably, NAD  Advised to notify nurse if anything needed.  Zola Button, MD 02/18/2020, 10:17 PM PGY-1, [place] Medicine Service pager [phone]

## 2020-02-18 NOTE — Care Management (Signed)
1527 02-18-20 Case Manager received orders from surgery for home wound vac. Case Manager faxed orders to [id number] for Wound Vac. As the patient gets closer to transitioning home, Case Manager will offer choice for home health services. Case Manager will continue to follow for additional transition of care needs. Bethena Roys, RN,BSN Case Manager

## 2020-02-18 NOTE — Progress Notes (Addendum)
PT Cancellation Note  Patient Details Name: Alexis Avila MRN: [id number] DOB: 1981-05-20   Cancelled Treatment:    Reason Eval/Treat Not Completed: Fatigue/lethargy limiting ability to participate. PT attempted x 2. Pt politely declining. She states she didn't get much sleep last night and has had a busy morning, including wound vac change. PT to re-attempt as time allows.   Lorriane Shire 02/18/2020, 11:53 AM   Lorrin Goodell, PT  Office # [phone] Pager [phone]

## 2020-02-18 NOTE — Progress Notes (Signed)
Bilateral lower extremity venous duplex has been completed. Preliminary results can be found in CV Proc through chart review.   02/18/20 4:00 PM Carlos Levering RVT

## 2020-02-18 NOTE — Progress Notes (Signed)
4 Days Post-Op  Subjective: CC: Patient reports she feels worse today. She has increased LUQ abdominal pain that she rates as a 8/10. She describes that pain as cramping. She notes that she is trying to drink/eat FLD but having constant burping/belching. Unsure if it makes her pain worse. She does not like the ensure and says that milk based products usually do not do well for her. No nausea or emesis. She is passing flatus. G-tube was never unclamped yesterday. Walked 234ft with PT yesterday.   Yesterdays intake:  Breakfast: 1 carnations instant breakfast with milk Lunch: 2 spoons of cheddar corn soup Evening: 1/2 ensure w/ ice, 3/4 cup of potatoe= soup'  Breakfast: 2 sips of chocolate ensure   Objective: Vital signs in last 24 hours: Temp:  [98.3 F (36.8 C)-100.5 F (38.1 C)] 98.3 F (36.8 C) (08/06 0505) Pulse Rate:  [95-106] 105 (08/05 2144) Resp:  [16-19] 18 (08/06 0505) BP: (110-134)/(62-81) 134/81 (08/06 0505) SpO2:  [95 %-100 %] 95 % (08/06 0505) Weight:  [140.2 kg] 140.2 kg (08/06 0505) Last BM Date: 02/15/20  Intake/Output from previous day: 08/05 0701 - 08/06 0700 In: 2878.7 [P.O.:360; I.V.:1438.7; Blood:630; IV Piggyback:450] Out: 2218 [Urine:600; Drains:1618] Intake/Output this shift: No intake/output data recorded.  PE: Gen: Alert, NAD, pleasant Card:Tachycardic with regular rhythm Pulm: CTAB, no W/R/R, effort normal.  Abd: Soft,mild distension, more tenderness in the LUQ today. No peritonitis. Hypoactive BS. Midline wound with wound vac in place. Wound vac drainage bloody. JP in place with light brown drainage in bulb (see picture). Does not look grossly bilious. G-tube to ordered to be to gravity but currently clamped. Unclamped with rush of 400cc bilious output into bag. Ext: 1-2+ edema of lower extremities. Calves equal in size. No calf tenderness.  Psych: A&Ox3  Skin: no rashes noted, warm and dry      Lab Results:  Recent Labs     02/17/20 0458 02/17/20 0458 02/17/20 1427 02/18/20 0601  WBC 14.6*  --   --  15.3*  HGB 6.3*   < > 7.8* 7.3*  HCT 20.1*   < > 24.7* 24.1*  PLT 231  --   --  261   < > = values in this interval not displayed.   BMET Recent Labs    02/16/20 0457 02/17/20 0458  NA 135 133*  K 3.5 3.7  CL 106 103  CO2 21* 18*  GLUCOSE 111* 99  BUN 5* <5*  CREATININE 0.68 0.67  CALCIUM 7.8* 7.7*   PT/INR No results for input(s): LABPROT, INR in the last 72 hours. CMP     Component Value Date/Time   NA 133 (L) 02/17/2020 0458   K 3.7 02/17/2020 0458   CL 103 02/17/2020 0458   CO2 18 (L) 02/17/2020 0458   GLUCOSE 99 02/17/2020 0458   BUN <5 (L) 02/17/2020 0458   CREATININE 0.67 02/17/2020 0458   CALCIUM 7.7 (L) 02/17/2020 0458   PROT 4.8 (L) 02/17/2020 0458   ALBUMIN 1.7 (L) 02/17/2020 0458   AST 31 02/17/2020 0458   ALT 20 02/17/2020 0458   ALKPHOS 417 (H) 02/17/2020 0458   BILITOT 1.8 (H) 02/17/2020 0458   GFRNONAA >60 02/17/2020 0458   GFRAA >60 02/17/2020 0458   Lipase     Component Value Date/Time   LIPASE 113 (H) 02/09/2020 1226       Studies/Results: No results found.  Anti-infectives: Anti-infectives (From admission, onward)   Start     Dose/Rate Route Frequency  Ordered Stop   02/14/20 0800  metroNIDAZOLE (FLAGYL) IVPB 500 mg     Discontinue     500 mg 100 mL/hr over 60 Minutes Intravenous Every 8 hours 02/14/20 0131     02/14/20 0230  cefTRIAXone (ROCEPHIN) 2 g in sodium chloride 0.9 % 100 mL IVPB     Discontinue     2 g 200 mL/hr over 30 Minutes Intravenous Daily at bedtime 02/14/20 0130     02/14/20 0130  metroNIDAZOLE (FLAGYL) IVPB 500 mg        500 mg 100 mL/hr over 60 Minutes Intravenous  Once 02/14/20 0121 02/14/20 0235   02/12/20 0800  vancomycin (VANCOCIN) IVPB 1000 mg/200 mL premix        1,000 mg 200 mL/hr over 60 Minutes Intravenous To Radiology 02/11/20 1619 02/12/20 1105       Assessment/Plan Marginal ulcer - switch back to bid IV protonix;  when resume oral intake and tolerating - will add carafate back, protonix will need to be crushed - not taken whole.  ABL Anemia - hgb 7.3 this AM. S/p 1U PRBC 8/5. Lovenox on hold   Edema - +8.7L since admit. On 5ml/hr IVF. Calf tenderness b/l. Will get LE US's.    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 Left abdominal wall hematoma/abscess with possible perforation. Serosal tear, Roux limb -S/p exp lap, lysis of adhesions >2hrs, drainage of intra-abdominal hematoma/abscess, insertion of G tube in remnant stomach, partial resection/reconstruction of roux limb 8/2 Dr [name] - POD #4 - Make NPO - Maintain JP. Monitor output. Light brown today. Not bilious. Will review with Dr. [name] to see if any imaging is warranted.  -  G-tube in remnant stomach to gravity. Cannotuse g tube for feeds since status of BP limb is unclear. Plan to do contrasted study thru G tube in a few days (if tolerates clamping or when output has decreased to <1L/24 hours). - D/c abx today - Pulm toilet, pulling 1000. 10x/hour - Mobilize, PTordered. Rec HH - WOCN for wound vac changeM/W/F. Will see with WOCN today  FEN - NPO (ice chips), IVF @ 78ml/hr, K 2.8 (goal > 4) - replace. Check Mg ID - Rocephin/Flagyl 8/2-8/6. Afebrile. WBC 14.6 VTE prophylaxis - scds, hold Lovenox for anemia. Consider restarting if hgb stable x 24 hours. Checking LE DVT US's.  Foley - out Follow-Up - Dr. [name]   LOS: 8 days    Jillyn Ledger , [facility] Surgery 02/18/2020, 8:23 AM Please see Amion for pager number during day hours 7:00am-4:30pm

## 2020-02-18 NOTE — Progress Notes (Signed)
Family Medicine Teaching Service Daily Progress Note Intern Pager: [phone]  Patient name: Alexis Avila Medical record number: [id number] Date of birth: 1981-05-10 Age: 39 y.o. Gender: female  Primary Care Provider: [name], MD Consultants: GI, Surgery, Wound care Code Status: FULL  Pt Overview and Major Events to Date:  Admitted 7/28 7/31- CT guided aspiration of fluid from LUQ by IR  8/2- Exploratory laparotomy, lysis of adhesion, G-tube placement in gastric remnant, Roux limb resection, and drainage of LUQ hematome with  drain placed 8/4- Wound vac changed with IV pain medications   Assessment and Plan: Danira Boltonis a 39 y.o.femalepresenting with 5-day history epigastric abdominal pain, found to havepartialSBO. After aspiration of suspected pseudocyst, patient developed worsening pain and sepsis. On 8/2 went to OR for lysis of adhesions (2 hours) found to have intra-abdominal hematoma and abscess. Now s/p G-tube placement in gastric pouch, resection of Roux limb. PMH significant for obesity, Roux-en-Y gastri bypass, chronic anemia   Sepsis,likelysource bowelwith possibleperforation- acute, improving Afebrile, HR, and WBC improving. Allergy to Piperacillin. WBC15.3 slight trend upward.Patientrepeat UA had moderate leukocytes, proteinuria, and few bacteria. Bcx has had ngtd.   Patient had a fever of 100.5 overnight and reports SOB.   - [facility] will transfer to telemetry as able on 8/6.  - Continue metronidazole and ceftriaxone -F/U postop surgery recommendations - JP drain output appears to resemble chocolate Ensure that patient drank 8/5 - G tube output bilious -f/u CTA abdomen - f/u DVT US of LE -If hemoglobin stable 8/7, resume Lovenox  Gastric ulcers- acute, improving EGD showed large marginal ulcer. Biopsies of ulcer returned:Inflammation but negative for H. Pylori. -ContinueProtonix 40 BID for 8 weeks  -per surgery,Clear liquids no  meds PO - Restart Carafate when taking PO   Small bowel obstruction, s/p aspiration of suspected pseudocyst by IR, then exploratory laparotomy with lysis of adhesions, resection of Roux limb, procedures as below.  Per surgery they do not believe she has anastomotic leak of Roux limb but at this point think they need to rule out and make sure there is no leak.  They are trying to figure out imaging options with radiology and her anatomy.      WBC 15.3.  Hgb7.3. K+ 2.8, Na135.Patient AM Hgb 7.3. Per surgery they will attempt to clamp the G tube and see how tolerates. Per surgery patient may be going to IR today. -f/u surgery recomendations -Vitals q4h  -A.m. CMP/CBC -Continue Zofranas neededfor nausea  -DiscontinueLovenoxand continue SCDs for DVT prophylaxis,  - Scheduled Miralax -Per GI recommendationKeep K >4 and Mg >2 for bowel function -Continueabx: ceftriaxoneday #5/5, Metronidazoleday #5/5, today is post-op day 4/4: Surgery agrees to continue ABX IV - Continue Morphine 2mg  q 1hr PRN, transition to oral as able.    Elevated alkaline phosphatase- improving ALP1,066> 920>813>823>429>417,AST 29>64>118>106>42>19>31, ALT 55>61>71>80>61>26>20. AST and ALT have been WNL.  - miralax scheduled -Continue to monitor postop  Recent diagnosis of UTI-stable UCxprior to ex lap, 8/2 had multiple species present, suggest recollection. Patient has been urinating well does not report increased frequency or pain with urination while hospitalized. UA repeat had moderate leukocytes, amber color, and consistent proteinuria. No concern due to f/u gram stain and culture because patient is already on abx.   Hypokalemia- improved Mild Hyponatremia Most recentK+ 2.8.NA 135.Patient endorsed regular BM prior to ex lap. - Per GI keep K>4 and Mg>2 for Bowel function, due to regular BM will not replenish K+ to 4 at this time. - f/u Phos - f/u  renal function - replenish K,  80mEq from GI and 50meQ from FPTS -AM CMP - Will continue to monitor - Mg 1.9 - elevate legsbut make sure bag is bile bag is down for gravity drain  Normocytic anemiahistory of B12 deficiency anemias/p gastric bypass- acute on chronic, worsening Am Hgb7.3. Received 1uPRBc 8/5 w/ Hgb 6.3. Last hemoglobin in chart, prior to hospitalization wason 07/20/2019 was 10.6.B12 261, Folate 8.9. -Transfusion threshold of 7 due to lack of CAD/PAD comorbidity -Follow-up morning CBC -f/u blood smear - f/u haptoglobin  Hyperglycemia- improving AM BGL 108. 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 Etiology of heavy alcohol use. She has since cut back, now drinking twice a week with reported 3-4 drinks per episode. Drink of choice white wine or vodka. Last drink 2 weeks ago.Most recent CIWA=0. Labs as above. -Discontinue CIWAs - Educate patient on alcoholuse and adverse effects including effect ongastric ulcers  GERD- chronic, stable Currently no complaints of heartburn. -Protonix40 BID for 8 weeks  FEN/GI:Bari full liquids, protein shake, PPx: SCDs, HoldLovenox  Disposition: Home Health PT, NO OT necessary   Subjective:  Overnight patient Tmax 100.5.  Night team was not paged however patient is already on antibiotic regimen.  Patient appears stable on exam this morning she reports feeling more tired than usual and has SOB.  Patient reports that she had some chest pain after drinking the Ensure yesterday, "it felt as if I have been running."  She remembers surgery being concerned for possible blood clot this AM.  Patient reports that surgery team saw her this a.m. prior to my visit.  Per patient the team seemed unhappy that she was having output into the G-tube that resembled Ensure from day before.  Patient also reported that surgery may hold off on sending her to radiology.  Patient  continues to feel fluid overloaded despite surgery slowing down MIVF.   Objective: Temp:  [98.3 F (36.8 C)-100.5 F (38.1 C)] 98.3 F (36.8 C) (08/06 0505) Pulse Rate:  [95-106] 105 (08/05 2144) Resp:  [16-19] 18 (08/06 0505) BP: (110-134)/(62-81) 134/81 (08/06 0505) SpO2:  [95 %-100 %] 95 % (08/06 0505) Weight:  [140.2 kg] 140.2 kg (08/06 0505) Physical Exam: General: Resting in bed appears uncomfortable, NAD appropriate mood and affect Cardiovascular: Tachycardic regular rhythm, no murmur Respiratory: Clear apical bilaterally and lateral breath sounds, no extra work of breathing noted Abdomen: Wound VAC in place Extremities:  bilateral lower extremity 2+ pitting edema with the left leg more swollen than right.  Pitting edema to the shin, nonpitting edema to the hip.  Edema is significant, left leg is slightly more swollen than right leg  Laboratory: Recent Labs  Lab 02/16/20 0457 02/16/20 1321 02/17/20 0458 02/17/20 1427 02/18/20 0601  WBC 20.5*  --  14.6*  --  15.3*  HGB 7.7*   < > 6.3* 7.8* 7.3*  HCT 24.8*   < > 20.1* 24.7* 24.1*  PLT 265  --  231  --  261   < > = values in this interval not displayed.   Recent Labs  Lab 02/13/20 0326 02/14/20 0105 02/15/20 0514 02/16/20 0457 02/17/20 0458  NA 133*   < > 134* 135 133*  K 3.6   < > 3.7 3.5 3.7  CL 108   < > 108 106 103  CO2 19*   < > 20* 21* 18*  BUN <5*   < > 7 5* <5*  CREATININE 0.59   < > 0.63 0.68 0.67  CALCIUM 8.8*   < > 8.0* 7.8* 7.7*  PROT 5.6*  --   --  5.2* 4.8*  BILITOT 0.9  --   --  1.4* 1.8*  ALKPHOS 823*  --   --  429* 417*  ALT 61*  --   --  26 20  AST 42*  --   --  19 31  GLUCOSE 102*   < > 137* 111* 99   < > = values in this interval not displayed.     Imaging/Diagnostic Tests: No results found. in past 24 h.  Freida Busman, MD 02/18/2020, 8:11 AM PGY-1, [place] Intern pager: [phone], text pages welcome

## 2020-02-18 NOTE — Consult Note (Signed)
[facility] Nurse wound follow up Wound type: abdominal surgical wound Measurement: 22 cm x 7 cm x 7 cm  Wound QRF:XJOI pink nongranulating. Drainage (amount, consistency, odor) moderate serosanguinous  Noted in canister Periwound:intact  Dressing procedure/placement/frequency: cleansed with NS.  2 pieces black foam in wound bed.  Drape over wound bed.  Seal immediately achieved at 125 mmHg.  Change mon/Wed/Fri.  Will not follow at this time.  Please re-consult if needed.  Domenic Moras MSN, RN, FNP-BC [facility] Wound, Ostomy, Continence Nurse Pager [phone]

## 2020-02-19 ENCOUNTER — Inpatient Hospital Stay: Payer: Self-pay

## 2020-02-19 DIAGNOSIS — D62 Acute posthemorrhagic anemia: Secondary | ICD-10-CM

## 2020-02-19 LAB — CBC
HCT: 22.6 % — ABNORMAL LOW (ref 36.0–46.0)
Hemoglobin: 7 g/dL — ABNORMAL LOW (ref 12.0–15.0)
MCH: 26.8 pg (ref 26.0–34.0)
MCHC: 31 g/dL (ref 30.0–36.0)
MCV: 86.6 fL (ref 80.0–100.0)
Platelets: 263 10*3/uL (ref 150–400)
RBC: 2.61 MIL/uL — ABNORMAL LOW (ref 3.87–5.11)
RDW: 20.5 % — ABNORMAL HIGH (ref 11.5–15.5)
WBC: 13 10*3/uL — ABNORMAL HIGH (ref 4.0–10.5)
nRBC: 2.3 % — ABNORMAL HIGH (ref 0.0–0.2)

## 2020-02-19 LAB — RENAL FUNCTION PANEL
Albumin: 1.7 g/dL — ABNORMAL LOW (ref 3.5–5.0)
Albumin: 1.7 g/dL — ABNORMAL LOW (ref 3.5–5.0)
Anion gap: 8 (ref 5–15)
Anion gap: 11 (ref 5–15)
BUN: <5 mg/dL — ABNORMAL LOW (ref 6–20)
BUN: <5 mg/dL — ABNORMAL LOW (ref 6–20)
CO2: 22 mmol/L (ref 22–32)
CO2: 20 mmol/L — ABNORMAL LOW (ref 22–32)
Calcium: 7.8 mg/dL — ABNORMAL LOW (ref 8.9–10.3)
Calcium: 7.5 mg/dL — ABNORMAL LOW (ref 8.9–10.3)
Chloride: 106 mmol/L (ref 98–111)
Chloride: 104 mmol/L (ref 98–111)
Creatinine, Ser: 0.54 mg/dL (ref 0.44–1.00)
Creatinine, Ser: 0.55 mg/dL (ref 0.44–1.00)
GFR calc Af Amer: >60 mL/min (ref >60)
GFR calc Af Amer: >60 mL/min (ref >60)
GFR calc non Af Amer: >60 mL/min (ref >60)
GFR calc non Af Amer: >60 mL/min (ref >60)
Glucose, Bld: 146 mg/dL — ABNORMAL HIGH (ref 70–99)
Glucose, Bld: 120 mg/dL — ABNORMAL HIGH (ref 70–99)
Phosphorus: <1 mg/dL — CL (ref 2.5–4.6)
Phosphorus: 1.5 mg/dL — ABNORMAL LOW (ref 2.5–4.6)
Potassium: 3.2 mmol/L — ABNORMAL LOW (ref 3.5–5.1)
Potassium: 3.4 mmol/L — ABNORMAL LOW (ref 3.5–5.1)
Sodium: 136 mmol/L (ref 135–145)
Sodium: 135 mmol/L (ref 135–145)

## 2020-02-19 LAB — COMPREHENSIVE METABOLIC PANEL
ALT: 15 U/L (ref 0–44)
AST: 15 U/L (ref 15–41)
Albumin: 1.6 g/dL — ABNORMAL LOW (ref 3.5–5.0)
Alkaline Phosphatase: 378 U/L — ABNORMAL HIGH (ref 38–126)
Anion gap: 10 (ref 5–15)
BUN: <5 mg/dL — ABNORMAL LOW (ref 6–20)
CO2: 21 mmol/L — ABNORMAL LOW (ref 22–32)
Calcium: 7.8 mg/dL — ABNORMAL LOW (ref 8.9–10.3)
Chloride: 104 mmol/L (ref 98–111)
Creatinine, Ser: 0.51 mg/dL (ref 0.44–1.00)
GFR calc Af Amer: >60 mL/min (ref >60)
GFR calc non Af Amer: >60 mL/min (ref >60)
Glucose, Bld: 114 mg/dL — ABNORMAL HIGH (ref 70–99)
Potassium: 3.3 mmol/L — ABNORMAL LOW (ref 3.5–5.1)
Sodium: 135 mmol/L (ref 135–145)
Total Bilirubin: 0.8 mg/dL (ref 0.3–1.2)
Total Protein: 5.3 g/dL — ABNORMAL LOW (ref 6.5–8.1)

## 2020-02-19 LAB — CBC WITH DIFFERENTIAL/PLATELET
Abs Immature Granulocytes: 1.01 10*3/uL — ABNORMAL HIGH (ref 0.00–0.07)
Basophils Absolute: 0.1 10*3/uL (ref 0.0–0.1)
Basophils Relative: 0 %
Eosinophils Absolute: 0 10*3/uL (ref 0.0–0.5)
Eosinophils Relative: 0 %
HCT: 20.1 % — ABNORMAL LOW (ref 36.0–46.0)
Hemoglobin: 6.3 g/dL — CL (ref 12.0–15.0)
Immature Granulocytes: 7 %
Lymphocytes Relative: 13 %
Lymphs Abs: 1.9 10*3/uL (ref 0.7–4.0)
MCH: 26.3 pg (ref 26.0–34.0)
MCHC: 31.3 g/dL (ref 30.0–36.0)
MCV: 83.8 fL (ref 80.0–100.0)
Monocytes Absolute: 0.8 10*3/uL (ref 0.1–1.0)
Monocytes Relative: 6 %
Neutro Abs: 10.9 10*3/uL — ABNORMAL HIGH (ref 1.7–7.7)
Neutrophils Relative %: 74 %
Platelets: 231 10*3/uL (ref 150–400)
RBC: 2.4 MIL/uL — ABNORMAL LOW (ref 3.87–5.11)
RDW: 20.4 % — ABNORMAL HIGH (ref 11.5–15.5)
WBC: 14.6 10*3/uL — ABNORMAL HIGH (ref 4.0–10.5)
nRBC: 1.3 % — ABNORMAL HIGH (ref 0.0–0.2)

## 2020-02-19 LAB — CULTURE, BLOOD (ROUTINE X 2)
Culture: NO GROWTH
Culture: NO GROWTH
Special Requests: ADEQUATE
Special Requests: ADEQUATE

## 2020-02-19 LAB — MAGNESIUM: Magnesium: 1.7 mg/dL (ref 1.7–2.4)

## 2020-02-19 LAB — PHOSPHORUS: Phosphorus: <1 mg/dL — CL (ref 2.5–4.6)

## 2020-02-19 LAB — PATHOLOGIST SMEAR REVIEW

## 2020-02-19 LAB — LIPASE, BLOOD: Lipase: 202 U/L — ABNORMAL HIGH (ref 11–51)

## 2020-02-19 LAB — AMYLASE: Amylase: 137 U/L — ABNORMAL HIGH (ref 28–100)

## 2020-02-19 MED ORDER — SODIUM CHLORIDE 0.9% FLUSH: 10.0000 mL | INTRAVENOUS | Status: DC | PRN

## 2020-02-19 MED ORDER — OXYCODONE HCL 5 MG PO TABS
5.0000 mg | ORAL_TABLET | ORAL | Status: DC | PRN
  Administered 2020-02-19 – 2020-02-20 (×3): 5 mg via ORAL
  Filled 2020-02-19 (×3): qty 1

## 2020-02-19 MED ORDER — SUCRALFATE 1 GM/10ML PO SUSP
1.0000 g | Freq: Three times a day (TID) | ORAL | Status: DC
  Administered 2020-02-19 – 2020-02-23 (×16): 1 g via ORAL
  Filled 2020-02-19 (×16): qty 10

## 2020-02-19 MED ORDER — K PHOS MONO-SOD PHOS DI & MONO 155-852-130 MG PO TABS
500.0000 mg | ORAL_TABLET | Freq: Four times a day (QID) | ORAL | Status: DC
  Administered 2020-02-19: 500 mg via ORAL
  Filled 2020-02-19 (×3): qty 2

## 2020-02-19 MED ORDER — POTASSIUM PHOSPHATES 15 MMOLE/5ML IV SOLN
30.0000 mmol | Freq: Once | INTRAVENOUS | Status: AC
  Administered 2020-02-19: 30 mmol via INTRAVENOUS
  Filled 2020-02-19: qty 10

## 2020-02-19 MED ORDER — MORPHINE SULFATE (PF) 2 MG/ML IV SOLN
1.0000 mg | INTRAVENOUS | Status: DC | PRN
  Administered 2020-02-19 – 2020-02-20 (×3): 1 mg via INTRAVENOUS
  Filled 2020-02-19 (×3): qty 1

## 2020-02-19 MED ORDER — SODIUM CHLORIDE 0.9% FLUSH
10.0000 mL | Freq: Two times a day (BID) | INTRAVENOUS | Status: DC
  Administered 2020-02-19: 40 mL
  Administered 2020-02-20 – 2020-02-21 (×3): 20 mL
  Administered 2020-02-22 (×2): 10 mL
  Administered 2020-02-23: 20 mL

## 2020-02-19 NOTE — Progress Notes (Signed)
Family Medicine Teaching Service Daily Progress Note Intern Pager: [phone]  Patient name: Alexis Avila Medical record number: [id number] Date of birth: 07-Nov-1980 Age: 39 y.o. Gender: female  Primary Care Provider: [name], MD Consultants: GI, Surgery, Wound care Code Status: FULL  Pt Overview and Major Events to Date:  Admitted 7/28 7/31- CT guided aspiration of fluid from LUQ by IR  8/2- Exploratory laparotomy, lysis of adhesion, G-tube placement in gastric remnant, Roux limb resection, and drainage of LUQ hematoma with drain placed 8/4- Wound vac changed with IV pain medications  Assessment and Plan: Alexis Boltonis a 39 y.o.female with a history of obesity s/p Roux-en-Y gastric bypass, GERD, AUD, and chronic anemia now POD#5 s/p ex-lap for lysis of adhesions > 2 hrs, drainage of intra-abdominal hematoma/abscess, insertion of G tube in remnant stomach, and partial resection/reconstruction of roux limb by Dr. [name] 8/2.  POD#5 s/p ex-lap  Partial SBO  Sepsis Performed in the setting of sepsis and increased pain due to possible iatrogenic bowel perforation (after aspiration of bowel thought to be pancreatic pseudocyst on 7/31), found to have intra-abdominal hematoma and abscess. Also s/p G-tube placement in gastric pouch and resection of Roux limb.  Last fever 8/5 to 100.23F while on abx, afebrile overnight. Overall improving, pain well-controlled. Having BMs. Hgb stable. CT A/P with no leak from roux limb and no free air, increased bilateral lobe atelectasis. Lower venous DVT study negative. - f/u surgery recs - continue CTX, metronidazole - f/u amylase/lipase from JP fluid  - consider repeat cultures and broadening abx to include pseudomonas coverage if febrile again on abx - PT/OT  Atelectasis Increased consolidation in bilateral lobes on CT A/P consistent with atelectasis. Though patient recently spiked fever, pneumonia less likely given absence of cough and SOB,  likely intra-abdominal source of infection, and bilateral consolidation atypical for bacterial pneumonia. - IS - repeat CXR 8/9  Marginal gastric ulcer Improving. Large marginal ulcer on EGD, bx with inflammation but negative for H. Pylori. - pantoprazole 4 mg BID for 8 weeks - resume sucralfate when able to take PO meds  Pitting edema Likely iatrogenic volume overload. Wt 125.2 kg >>> 140.6 kg since admission (7/29), net increase of 15.6 kg. Significant pitting edema on exam, at risk for pulmonary edema but no SOB at this time. IV fluids d/c'd. - IV fluids stopped - consider IV furosemide 20 mg if PM RFP OK (P > 1.0, K nl) - consider TTE to r/o CHF - daily weights - strict I/O  Hypophosphatemia Critical lab phosphorus < 1.0 overnight. Asymptomatic. - replete with IV KPhos - PM RFP - AM CMP   Hypokalemia Improving. K 2.8 > 3.3. - receiving IV KPhos   Elevated ALP Improving. In the setting of obstruction of biliary pancreatic limb due to dense adhesions. ALP 1,066 >>> 378. ALT/AST wnl.  Normocytic anemia Hgb stable, mild drop 7.4 > 7.0. Has history of B12 deficiency anemia. S/p 1u pRBC 8/5 for Hgb 6.3. Most recent prior Hgb 10.6 in 07/2019. Normal folate and B12 this admission. Elevated haptoglobin, no evidence of hemolysis. - transfusion threshold 7 - AM CBC  History of alcohol use disorder With history of pancreatitis requiring hospitalization in 2018. Currently drinking about twice a week, 3-4 drinks per day per patient. Last CIWA 0, CIWAs were discontinued. - continue education and counseling  GERD Chronic, stable. - on PPI as above  FEN/GI: bariatric clears, advance per surgery PPx: SCDs  Disposition: Home Health PT, NO OT necessary  Subjective:  Overnight, received critical lab value, phosphorus <1. Patient slept well overnight, pain is currently tolerable. Reports oxycodone has been helping. Feeling overall weak at baseline, but denies feeling more weak than  baseline. Denies SOB. Yesterday, tolerated bariatric clear liquid diet without vomiting. Had a BM yesterday.  Objective: Temp:  [98 F (36.7 C)-98.7 F (37.1 C)] 98.3 F (36.8 C) (08/07 0048) Pulse Rate:  [90-100] 90 (08/07 0048) Resp:  [18-20] 18 (08/07 0048) BP: (109-120)/(63-66) 120/66 (08/07 0048) SpO2:  [95 %-98 %] 98 % (08/07 0048) Weight:  [139.9 kg] 139.9 kg (08/06 1300) Physical Exam: General: Obese woman sitting in bed, comfortable, in good spirits, NAD Cardiovascular: Tachycardic, regular rhythm, no murmurs Respiratory: CTAB, no wheezes or rales, breathing comfortably on room air Abdomen: Soft, appropriately tender diffusely primarily LLQ and midline at surgical sites, +BS, wound-vac in place, JP draining small amount of brownish liquid, G-tube in place with scant brown drainage Extremities: Pitting edema L>R to approximately 3/4 of shin, warm Neuro: CN II-XII grossly intact, full strength to bilateral upper and lower extremities  Laboratory: Recent Labs  Lab 02/18/20 0601 02/18/20 1730 02/19/20 0058  WBC 15.3* 13.5* 13.0*  HGB 7.3* 7.4* 7.0*  HCT 24.1* 23.4* 22.6*  PLT 261 260 263   Recent Labs  Lab 02/17/20 0458 02/18/20 0601 02/19/20 0058  NA 133* 135 135  K 3.7 2.8* 3.3*  CL 103 102 104  CO2 18* 20* 21*  BUN <5* <5* <5*  CREATININE 0.67 0.65 0.51  CALCIUM 7.7* 7.9* 7.8*  PROT 4.8* 5.6* 5.3*  BILITOT 1.8* 1.4* 0.8  ALKPHOS 417* 422* 378*  ALT 20 18 15   AST 31 20 15   GLUCOSE 99 108* 114*    Phosphorus < 1.0  Imaging/Diagnostic Tests: CT A/P with no leak from roux limb and no free air, increased bilateral lobe atelectasis.  Lower venous DVT study negative.  Alexis Button, MD 02/19/2020, 6:08 AM PGY-1, [place] Intern pager: [phone], text pages welcome

## 2020-02-19 NOTE — Progress Notes (Signed)
Patient ID: Alexis Avila, female   DOB: 1981/05/10, 39 y.o.   MRN: [id number] [facility] Surgery Progress Note:   5 Days Post-Op  Subjective: Mental status is clear.  Complaints had a good night. Objective: Vital signs in last 24 hours: Temp:  [98.3 F (36.8 C)-98.8 F (37.1 C)] 98.8 F (37.1 C) (08/07 0748) Pulse Rate:  [90-113] 106 (08/07 0748) Resp:  [18-20] 20 (08/07 0748) BP: (109-120)/(63-66) 114/66 (08/07 0748) SpO2:  [97 %-98 %] 98 % (08/07 0611) Weight:  [139.9 kg-140.6 kg] 140.6 kg (08/07 0606)  Intake/Output from previous day: 08/06 0701 - 08/07 0700 In: 1090 [P.O.:540; I.V.:450; IV Piggyback:100] Out: 3805 [Urine:2250; Drains:1555] Intake/Output this shift: No intake/output data recorded.  Physical Exam: Work of breathing is not labored;  Some swelling in lower extremity bilaterally.  JP serosanguinous; G tube in remnant  Lab Results:  Results for orders placed or performed during the hospital encounter of 02/09/20 (from the past 48 hour(s))  Save Smear     Status: None   Collection Time: 02/17/20  2:27 PM  Result Value Ref Range   Smear Review SMEAR STAINED AND AVAILABLE FOR REVIEW     Comment: Performed at [facility], [place] [address] [place], [place] [address]  Haptoglobin     Status: Abnormal   Collection Time: 02/17/20  2:27 PM  Result Value Ref Range   Haptoglobin 348 (H) 33 - 278 mg/dL    Comment: (NOTE) Performed At: [facility] [place], [place] [id number] Rush Farmer MD [id number]   Hemoglobin and hematocrit, blood     Status: Abnormal   Collection Time: 02/17/20  2:27 PM  Result Value Ref Range   Hemoglobin 7.8 (L) 12.0 - 15.0 g/dL   HCT 24.7 (L) 36 - 46 %    Comment: Performed at [place] [address], [place], [place] [address]  CBC with Differential/Platelet     Status: Abnormal   Collection Time: 02/18/20  6:01 AM  Result Value Ref Range   WBC 15.3 (H) 4.0 - 10.5 K/uL   RBC 2.82 (L)  3.87 - 5.11 MIL/uL   Hemoglobin 7.3 (L) 12.0 - 15.0 g/dL   HCT 24.1 (L) 36 - 46 %   MCV 85.5 80.0 - 100.0 fL   MCH 25.9 (L) 26.0 - 34.0 pg   MCHC 30.3 30.0 - 36.0 g/dL   RDW 20.0 (H) 11.5 - 15.5 %   Platelets 261 150 - 400 K/uL   nRBC 2.0 (H) 0.0 - 0.2 %   Neutrophils Relative % 89 %   Neutro Abs 13.6 (H) 1.7 - 7.7 K/uL   Lymphocytes Relative 7 %   Lymphs Abs 1.1 0.7 - 4.0 K/uL   Monocytes Relative 4 %   Monocytes Absolute 0.6 0 - 1 K/uL   Eosinophils Relative 0 %   Eosinophils Absolute 0.0 0 - 0 K/uL   Basophils Relative 0 %   Basophils Absolute 0.0 0 - 0 K/uL   nRBC 3 (H) 0 /100 WBC   Abs Immature Granulocytes 0.00 0.00 - 0.07 K/uL   Polychromasia PRESENT     Comment: Performed at [facility], [place] [address] [place], [place] [address]  Comprehensive metabolic panel     Status: Abnormal   Collection Time: 02/18/20  6:01 AM  Result Value Ref Range   Sodium 135 135 - 145 mmol/L   Potassium 2.8 (L) 3.5 - 5.1 mmol/L   Chloride 102 98 -  111 mmol/L   CO2 20 (L) 22 - 32 mmol/L   Glucose, Bld 108 (H) 70 - 99 mg/dL    Comment: Glucose reference range applies only to samples taken after fasting for at least 8 hours.   BUN <5 (L) 6 - 20 mg/dL   Creatinine, Ser 0.65 0.44 - 1.00 mg/dL   Calcium 7.9 (L) 8.9 - 10.3 mg/dL   Total Protein 5.6 (L) 6.5 - 8.1 g/dL   Albumin 1.8 (L) 3.5 - 5.0 g/dL   AST 20 15 - 41 U/L   ALT 18 0 - 44 U/L   Alkaline Phosphatase 422 (H) 38 - 126 U/L   Total Bilirubin 1.4 (H) 0.3 - 1.2 mg/dL   GFR calc non Af Amer >60 >60 mL/min   GFR calc Af Amer >60 >60 mL/min   Anion gap 13 5 - 15    Comment: Performed at [place] [address], [place], [place] [address]  Magnesium     Status: None   Collection Time: 02/18/20  9:26 AM  Result Value Ref Range   Magnesium 1.9 1.7 - 2.4 mg/dL    Comment: Performed at [place] [address] [place], [place] [address]  CBC     Status: Abnormal   Collection Time: 02/18/20  5:30 PM  Result  Value Ref Range   WBC 13.5 (H) 4.0 - 10.5 K/uL   RBC 2.73 (L) 3.87 - 5.11 MIL/uL   Hemoglobin 7.4 (L) 12.0 - 15.0 g/dL   HCT 23.4 (L) 36 - 46 %   MCV 85.7 80.0 - 100.0 fL   MCH 27.1 26.0 - 34.0 pg   MCHC 31.6 30.0 - 36.0 g/dL   RDW 20.2 (H) 11.5 - 15.5 %   Platelets 260 150 - 400 K/uL   nRBC 2.1 (H) 0.0 - 0.2 %    Comment: Performed at [facility] [address], [place], [place] [address]  Comprehensive metabolic panel     Status: Abnormal   Collection Time: 02/19/20 12:58 AM  Result Value Ref Range   Sodium 135 135 - 145 mmol/L   Potassium 3.3 (L) 3.5 - 5.1 mmol/L   Chloride 104 98 - 111 mmol/L   CO2 21 (L) 22 - 32 mmol/L   Glucose, Bld 114 (H) 70 - 99 mg/dL    Comment: Glucose reference range applies only to samples taken after fasting for at least 8 hours.   BUN <5 (L) 6 - 20 mg/dL   Creatinine, Ser 0.51 0.44 - 1.00 mg/dL   Calcium 7.8 (L) 8.9 - 10.3 mg/dL   Total Protein 5.3 (L) 6.5 - 8.1 g/dL   Albumin 1.6 (L) 3.5 - 5.0 g/dL   AST 15 15 - 41 U/L   ALT 15 0 - 44 U/L   Alkaline Phosphatase 378 (H) 38 - 126 U/L   Total Bilirubin 0.8 0.3 - 1.2 mg/dL   GFR calc non Af Amer >60 >60 mL/min   GFR calc Af Amer >60 >60 mL/min   Anion gap 10 5 - 15    Comment: Performed at [facility], [place] [address], [place] [address]  CBC     Status: Abnormal   Collection Time: 02/19/20 12:58 AM  Result Value Ref Range   WBC 13.0 (H) 4.0 - 10.5 K/uL   RBC 2.61 (L) 3.87 - 5.11 MIL/uL   Hemoglobin 7.0 (L) 12.0 - 15.0 g/dL   HCT 22.6 (L) 36 -  46 %   MCV 86.6 80.0 - 100.0 fL   MCH 26.8 26.0 - 34.0 pg   MCHC 31.0 30.0 - 36.0 g/dL   RDW 20.5 (H) 11.5 - 15.5 %   Platelets 263 150 - 400 K/uL   nRBC 2.3 (H) 0.0 - 0.2 %    Comment: Performed at [facility] [address], [place], [place] [address]  Magnesium     Status: None   Collection Time: 02/19/20 12:58 AM  Result Value Ref Range   Magnesium 1.7 1.7 - 2.4 mg/dL    Comment: Performed at [facility] [address], [place], [place] [address]  Phosphorus     Status: Abnormal   Collection Time: 02/19/20 12:58 AM  Result Value Ref Range   Phosphorus <1.0 (LL) 2.5 - 4.6 mg/dL    Comment: CRITICAL RESULT CALLED TO, READ BACK BY AND VERIFIED WITH: RN H PITTMAN @0240  02/19/20 BY S GEZAHEGN Performed at [facility], [place] [address], [place], [place] [address]   Renal function panel     Status: Abnormal   Collection Time: 02/19/20  7:50 AM  Result Value Ref Range   Sodium 136 135 - 145 mmol/L   Potassium 3.2 (L) 3.5 - 5.1 mmol/L   Chloride 106 98 - 111 mmol/L   CO2 22 22 - 32 mmol/L   Glucose, Bld 146 (H) 70 - 99 mg/dL    Comment: Glucose reference range applies only to samples taken after fasting for at least 8 hours.   BUN <5 (L) 6 - 20 mg/dL   Creatinine, Ser 0.54 0.44 - 1.00 mg/dL   Calcium 7.8 (L) 8.9 - 10.3 mg/dL   Phosphorus <1.0 (LL) 2.5 - 4.6 mg/dL    Comment: CONSISTENT WITH PREVIOUS RESULT CRITICAL RESULT CALLED TO, READ BACK BY AND VERIFIED WITH: MARGE LESSARD RN.@0901  ON 8.7.21 BY TCALDWELL MT.    Albumin 1.7 (L) 3.5 - 5.0 g/dL   GFR calc non Af Amer >60 >60 mL/min   GFR calc Af Amer >60 >60 mL/min   Anion gap 8 5 - 15    Comment: Performed at [facility] [address] [place], [place] [address]    Radiology/Results: DG Chest 2 View  Result Date: 02/18/2020 CLINICAL DATA:  Shortness of breath EXAM: CHEST - 2 VIEW COMPARISON:  February 15, 2020 FINDINGS: Evaluation is limited secondary to poor penetration. The cardiomediastinal silhouette is unchanged and enlarged in contour.Low lung volumes. Likely small LEFT pleural effusion. No pneumothorax. LEFT basilar opacity partially obscures the LEFT hemidiaphragm. Visualized abdomen is unremarkable. No acute osseous abnormality visualized. IMPRESSION: LEFT basilar opacity partially obscures the LEFT hemidiaphragm, likely a combination of atelectasis/consolidation and pleural fluid. 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 Recent pancreatitis. Previous gastric bypass surgery. EXAM: CT ABDOMEN AND PELVIS WITH CONTRAST TECHNIQUE: Multidetector CT imaging of the abdomen and pelvis was performed using the standard protocol following bolus administration of intravenous contrast. CONTRAST:  17mL OMNIPAQUE IOHEXOL 300 MG/ML  SOLN COMPARISON:  02/09/2020 FINDINGS: Lower Chest: Increased bilateral lower lobe atelectasis and tiny left pleural effusion noted. Hepatobiliary: No hepatic masses identified. Prior cholecystectomy. No evidence of biliary obstruction. Pancreas: Multiple small retroperitoneal fluid collections are seen adjacent to the pancreatic tail and in the left posterior  pararenal space, which are unchanged and consistent with small pseudocysts. Spleen: Within normal limits in size and appearance. Adrenals/Urinary Tract: No masses identified. No evidence of ureteral calculi or hydronephrosis. Stomach/Bowel: Prior gastric bypass surgery again noted. New percutaneous gastrostomy tube is seen in the bypassed portion of the stomach, which now shows decreased dilatation. Decreased dilatation of the afferent loop is also seen rim enhancing fluid and gas collection in the left upper quadrant mesentery has decreased in size, currently measuring 6.8 x 4.2 cm on image 34/3, compared to 9.9 by 5.9 cm previously. No evidence of extravasation of oral contrast material or free intraperitoneal air. Left abdominal surgical drain is also seen in place. Vascular/Lymphatic: No pathologically enlarged lymph nodes. No abdominal aortic aneurysm. Reproductive: Stable 3.8 cm posterior uterine fibroid. Adnexal regions are unremarkable. Other:  Increased diffuse body wall edema. Musculoskeletal:  No suspicious bone lesions identified. IMPRESSION: New percutaneous gastrostomy  tube in appropriate position, with decreased dilatation of bypassed stomach and afferent small bowel. No evidence of extravasation of oral contrast material or free intraperitoneal air. Decreased size of left upper quadrant mesenteric fluid collection. Stable small pseudocysts in left abdominal retroperitoneum. Increased bilateral lower lobe atelectasis, tiny left pleural effusion, and diffuse body wall edema. Stable 3.8 cm posterior uterine fibroid. Electronically Signed   By: Marlaine Hind M.D.   On: 02/18/2020 16:15   VAS [place] LOWER EXTREMITY VENOUS (DVT)  Result Date: 02/19/2020  Lower Venous DVTStudy Indications: Swelling, and Pain.  Limitations: Body habitus, poor ultrasound/tissue interface and patient positioning, patient pain tolerance. Comparison Study: No prior studies. Performing Technologist: Oliver Hum RVT  Examination Guidelines: A complete evaluation includes B-mode imaging, spectral Doppler, color Doppler, and power Doppler as needed of all accessible portions of each vessel. Bilateral testing is considered an integral part of a complete examination. Limited examinations for reoccurring indications may be performed as noted. The reflux portion of the exam is performed with the patient in reverse Trendelenburg.  +---------+---------------+---------+-----------+----------+--------------+ RIGHT    CompressibilityPhasicitySpontaneityPropertiesThrombus Aging +---------+---------------+---------+-----------+----------+--------------+ CFV      Full           Yes      Yes                                 +---------+---------------+---------+-----------+----------+--------------+ SFJ      Full                                                        +---------+---------------+---------+-----------+----------+--------------+ FV Prox  Full                                                        +---------+---------------+---------+-----------+----------+--------------+ FV Mid                   Yes      Yes                                 +---------+---------------+---------+-----------+----------+--------------+ FV Distal  Yes      Yes                                 +---------+---------------+---------+-----------+----------+--------------+ PFV                                                   Not visualized +---------+---------------+---------+-----------+----------+--------------+ POP      Full           Yes      Yes                                 +---------+---------------+---------+-----------+----------+--------------+ PTV      Full                                                        +---------+---------------+---------+-----------+----------+--------------+ PERO                                                  Not visualized +---------+---------------+---------+-----------+----------+--------------+   +---------+---------------+---------+-----------+----------+--------------+ LEFT     CompressibilityPhasicitySpontaneityPropertiesThrombus Aging +---------+---------------+---------+-----------+----------+--------------+ CFV      Full           Yes      Yes                                 +---------+---------------+---------+-----------+----------+--------------+ SFJ      Full                                                        +---------+---------------+---------+-----------+----------+--------------+ FV Prox  Full                                                        +---------+---------------+---------+-----------+----------+--------------+ FV Mid                  Yes      Yes                                 +---------+---------------+---------+-----------+----------+--------------+ FV Distal               Yes      Yes                                 +---------+---------------+---------+-----------+----------+--------------+ PFV  Not visualized +---------+---------------+---------+-----------+----------+--------------+ POP      Full           Yes      Yes                                 +---------+---------------+---------+-----------+----------+--------------+ PTV      Full                                                        +---------+---------------+---------+-----------+----------+--------------+ PERO     Full                                                        +---------+---------------+---------+-----------+----------+--------------+     Summary: RIGHT: - There is no evidence of deep vein thrombosis in the lower extremity. However, portions of this examination were limited- see technologist comments above.  - No cystic structure found in the popliteal fossa.  LEFT: - There is no evidence of deep vein thrombosis in the lower extremity. However, portions of this examination were limited- see technologist comments above.  - No cystic structure found in the popliteal fossa.  *See table(s) above for measurements and observations. Electronically signed by Harold Barban MD on 02/19/2020 at 1:18:24 AM.    Final     Anti-infectives: Anti-infectives (From admission, onward)   Start     Dose/Rate Route Frequency Ordered Stop   02/14/20 0800  metroNIDAZOLE (FLAGYL) IVPB 500 mg     Discontinue     500 mg 100 mL/hr over 60 Minutes Intravenous Every 8 hours 02/14/20 0131 02/19/20 2359   02/14/20 0230  cefTRIAXone (ROCEPHIN) 2 g in sodium chloride 0.9 % 100 mL IVPB     Discontinue     2 g 200 mL/hr over 30 Minutes Intravenous Daily at bedtime 02/14/20 0130 02/20/20 2159   02/14/20 0130  metroNIDAZOLE (FLAGYL) IVPB 500 mg        500 mg 100 mL/hr over 60 Minutes Intravenous  Once 02/14/20 0121 02/14/20 0235   02/12/20 0800  vancomycin (VANCOCIN) IVPB 1000 mg/200 mL premix        1,000 mg 200 mL/hr over 60 Minutes Intravenous To Radiology 02/11/20 1619 02/12/20 1105      Assessment/Plan: Problem  List: Patient Active Problem List   Diagnosis Date Noted  . SOB (shortness of breath)   . Lower extremity edema   . Dyspnea   . Status post exploratory laparotomy   . Abdominal pain   . Elevated LFTs   . Marginal ulcers   . History of Roux-en-Y gastric bypass   . Intractable nausea and vomiting 02/09/2020  . Hypokalemia   . Pseudocyst of pancreas   . SBO (small bowel obstruction) ([place])   . History of pancreatitis 07/20/2019  . Status post gastric bypass for obesity 07/20/2019  . Anemia, B12 deficiency 07/20/2019  . Current smoker 07/20/2019  . Chronic back pain 07/20/2019  . Ovarian cyst 07/20/2019  . GERD (gastroesophageal reflux disease) 07/20/2019  . Morbid obesity ([place]) 07/20/2019    Obstructed JJ - post laparotomy of obstruction.   5 Days Post-Op  LOS: 9 days   Matt B. Hassell Done, MD, [facility] [phone] to reach the surgeon on call.    02/19/2020 9:49 AM

## 2020-02-19 NOTE — Progress Notes (Signed)
CRITICAL VALUE ALERT  Critical Value: Phosphorus <1.0    Date & Time Notied:  02/19/20 2:45  Provider Notified: On call provider paged  Orders Received/Actions taken: pending call back & orders from MD

## 2020-02-19 NOTE — Progress Notes (Addendum)
Social visit with pt Boyfriend at [facility] - updated him No n/v Doing well with bari clears Jp - unchanged - grayish G tube - bilious Some drainage around JP Had a BM  Pt asking my thoughts about PICC- I don't anticipate need for TPN unless leak develops in roux limb   hgb stable Ok to resume prophylaxis Sunday  Ok to diuresis from my standpoint Will adv to bari fulls Cont protein shake If has cramps, burping, nausea, jitteriness after shakes - may need to try difft formulation with lower carb content  Ambulate D/w need to send JP fluid for amylase/lipase  Leighton Ruff. [name], MD, FACS General, Bariatric, & Minimally Invasive Surgery [facility], [place]

## 2020-02-19 NOTE — Progress Notes (Signed)
Peripherally Inserted Central Catheter Placement  The IV Nurse has discussed with the patient and/or persons authorized to consent for the patient, the purpose of this procedure and the potential benefits and risks involved with this procedure.  The benefits include less needle sticks, lab draws from the catheter, and the patient may be discharged home with the catheter. Risks include, but not limited to, infection, bleeding, blood clot (thrombus formation), and puncture of an artery; nerve damage and irregular heartbeat and possibility to perform a PICC exchange if needed/ordered by physician.  Alternatives to this procedure were also discussed.  Bard Power PICC patient education guide, fact sheet on infection prevention and patient information card has been provided to patient /or left at bedside.  Pt was speaking with "Dr Ouida Sills" upon entering room, verbalizes anxious re procedure but willing to proceed.  All questions answered to pt satisfaction.  PICC Placement Documentation  PICC Double Lumen 02/19/20 PICC Right Brachial 40 cm 1 cm (Active)  Indication for Insertion or Continuance of Line Poor Vasculature-patient has had multiple peripheral attempts or PIVs lasting less than 24 hours;Limited venous access - need for IV therapy >5 days (PICC only) 02/19/20 1814  Exposed Catheter (cm) 1 cm 02/19/20 1814  Site Assessment Clean;Dry;Intact 02/19/20 1814  Lumen #1 Status Flushed;Saline locked;Blood return noted 02/19/20 1814  Lumen #2 Status Flushed;Saline locked;Blood return noted 02/19/20 1814  Dressing Type Transparent 02/19/20 1814  Dressing Status Clean;Dry;Intact;Antimicrobial disc in place 02/19/20 1814  Safety Lock Not Applicable 71/24/58 0998  Line Care Connections checked and tightened 02/19/20 1814  Line Adjustment (NICU/IV Team Only) No 02/19/20 1814  Dressing Intervention New dressing 02/19/20 1814  Dressing Change Due 02/26/20 02/19/20 1814       Rolena Infante 02/19/2020, 6:15 PM

## 2020-02-20 LAB — CBC
HCT: 23.2 % — ABNORMAL LOW (ref 36.0–46.0)
Hemoglobin: 7 g/dL — ABNORMAL LOW (ref 12.0–15.0)
MCH: 26.9 pg (ref 26.0–34.0)
MCHC: 30.2 g/dL (ref 30.0–36.0)
MCV: 89.2 fL (ref 80.0–100.0)
Platelets: 274 10*3/uL (ref 150–400)
RBC: 2.6 MIL/uL — ABNORMAL LOW (ref 3.87–5.11)
RDW: 21.7 % — ABNORMAL HIGH (ref 11.5–15.5)
WBC: 13.1 10*3/uL — ABNORMAL HIGH (ref 4.0–10.5)
nRBC: 2.7 % — ABNORMAL HIGH (ref 0.0–0.2)

## 2020-02-20 LAB — COMPREHENSIVE METABOLIC PANEL
ALT: 15 U/L (ref 0–44)
AST: 17 U/L (ref 15–41)
Albumin: 1.7 g/dL — ABNORMAL LOW (ref 3.5–5.0)
Alkaline Phosphatase: 376 U/L — ABNORMAL HIGH (ref 38–126)
Anion gap: 8 (ref 5–15)
BUN: <5 mg/dL — ABNORMAL LOW (ref 6–20)
CO2: 22 mmol/L (ref 22–32)
Calcium: 7.1 mg/dL — ABNORMAL LOW (ref 8.9–10.3)
Chloride: 104 mmol/L (ref 98–111)
Creatinine, Ser: 0.51 mg/dL (ref 0.44–1.00)
GFR calc Af Amer: >60 mL/min (ref >60)
GFR calc non Af Amer: >60 mL/min (ref >60)
Glucose, Bld: 117 mg/dL — ABNORMAL HIGH (ref 70–99)
Potassium: 3.5 mmol/L (ref 3.5–5.1)
Sodium: 134 mmol/L — ABNORMAL LOW (ref 135–145)
Total Bilirubin: 1 mg/dL (ref 0.3–1.2)
Total Protein: 5.4 g/dL — ABNORMAL LOW (ref 6.5–8.1)

## 2020-02-20 LAB — PHOSPHORUS: Phosphorus: 1.4 mg/dL — ABNORMAL LOW (ref 2.5–4.6)

## 2020-02-20 LAB — MAGNESIUM: Magnesium: 1.6 mg/dL — ABNORMAL LOW (ref 1.7–2.4)

## 2020-02-20 LAB — AMYLASE, BODY FLUID (OTHER): Amylase, Body Fluid: 58306 U/L

## 2020-02-20 MED ORDER — ENOXAPARIN SODIUM 40 MG/0.4ML ~~LOC~~ SOLN
40.0000 mg | SUBCUTANEOUS | Status: DC
  Administered 2020-02-20 – 2020-02-23 (×4): 40 mg via SUBCUTANEOUS
  Filled 2020-02-20 (×5): qty 0.4

## 2020-02-20 MED ORDER — POTASSIUM PHOSPHATES 15 MMOLE/5ML IV SOLN
30.0000 mmol | Freq: Once | INTRAVENOUS | Status: AC
  Administered 2020-02-20: 30 mmol via INTRAVENOUS
  Filled 2020-02-20 (×2): qty 10

## 2020-02-20 MED ORDER — MAGNESIUM SULFATE 2 GM/50ML IV SOLN
2.0000 g | Freq: Once | INTRAVENOUS | Status: AC
  Administered 2020-02-20: 2 g via INTRAVENOUS
  Filled 2020-02-20: qty 50

## 2020-02-20 MED ORDER — FUROSEMIDE 10 MG/ML IJ SOLN
20.0000 mg | Freq: Once | INTRAMUSCULAR | Status: AC
  Administered 2020-02-20: 20 mg via INTRAVENOUS
  Filled 2020-02-20: qty 2

## 2020-02-20 MED ORDER — OXYCODONE HCL 5 MG PO TABS
5.0000 mg | ORAL_TABLET | ORAL | Status: DC | PRN
  Administered 2020-02-20 – 2020-02-22 (×10): 10 mg via ORAL
  Administered 2020-02-23: 5 mg via ORAL
  Administered 2020-02-23 (×3): 10 mg via ORAL
  Filled 2020-02-20 (×8): qty 2
  Filled 2020-02-20: qty 1
  Filled 2020-02-20: qty 2
  Filled 2020-02-20: qty 1
  Filled 2020-02-20: qty 2
  Filled 2020-02-20 (×2): qty 1
  Filled 2020-02-20: qty 2
  Filled 2020-02-20: qty 1

## 2020-02-20 NOTE — Progress Notes (Addendum)
Family Medicine Teaching Service Daily Progress Note Intern Pager: [phone]  Patient name: Alexis Avila Medical record number: [id number] Date of birth: 1980/10/18 Age: 39 y.o. Gender: female  Primary Care Provider: [name], MD Consultants: GI, Surgery, Wound care Code Status: FULL  Pt Overview and Major Events to Date:  Admitted 7/28 7/31- CT guided aspiration of fluid from LUQ by IR  8/2- Exploratory laparotomy, lysis of adhesion, G-tube placement in gastric remnant, Roux limb resection, and drainage of LUQ hematoma with drain placed 8/4- Wound vac changed with IV pain medications  Assessment and Plan: Alexis Boltonis a 39 y.o.female with a history of obesity s/p Roux-en-Y gastric bypass, GERD, AUD, and chronic anemia now POD#6 s/p ex-lap for lysis of adhesions > 2 hrs, drainage of intra-abdominal hematoma/abscess, insertion of G tube in remnant stomach, and partial resection/reconstruction of roux limb by Dr. [name] 8/2.  POD#6 s/p ex-lap  Partial SBO  Sepsis intraabdominal abscess VSS and afebrile overnight. Patient reports tolerating bariatric full liquid diet: no gas, bloating, pain. She would like to advance on her diet today. Patient had BM this morning. She does report some occasional pain, which was better tolerated with oxycodone 10mg . Will increase oxycodone to 5-10mg  (range) so patient can have less oxycodone if she doesn't need it. - f/u surgery recs- awaiting diet suggestion today, currently on bariatric full liquid diet - blood cultures NGTD, completed CTX and flagyl (8/2-8/7) - f/u amylase/lipase from JP fluid- currently in process - PT/OT  Metabolic Disarray C/f refeeding syndrome due to hypophosphatemia and hypokalemia yesterday. Potassium stable today at 3.5 after IV repletion yesterday, chloride stable at 104, sodium mildly low to 134. Phosphorus improved to 1.4 today, up from <1 yesterday. - Replete Phos w/ kphos IV  - Daily CMP, Phos, Mg  Acute blood  loss anemia, in setting of chronic anemia Hgb stable from yesterday at 7.0; lovenox d/c'd yesterday and SCDs added. S/p 1U pRBC transfusion on 8/5. Normal folate and B12 this admission. - transfusion threshold 7 - AM CBC - Perform ferritin and as outpatient  - restart lovenox  Pitting edema +3 pitting edema to above knee. IVF d/c'd yesterday. Will add lasix 20mg  IV (P >1, K nl) and compression stockings and assess for improvement, UOP. - start IV furosemide 20 mg if PM RFP OK (P > 1.0, K nl) - can consider TTE to r/o CHF if not improved - daily weights - strict I/O  Atelectasis Afebrile overnight, no cough. Increased consolidation in bilateral lobes on CT A/P consistent with atelectasis.  - IS - repeat CXR 8/9  Marginal gastric ulcer Improving. Large marginal ulcer on EGD, bx with inflammation but negative for H. Pylori. 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 ALP 1,066 >>> 376. ALT/AST wnl.  History of alcohol use disorder Continue education and counseling. CIWAs discontinued.  GERD Chronic, stable. - on PPI as above  FEN/GI: bariatric fulls, advance per surgery PPx: lovenox  Disposition: Home Health PT, NO OT necessary  Subjective:  Patient is still uncomfortable, but had overall better night  Objective: Temp:  [98.5 F (36.9 C)-99.3 F (37.4 C)] 98.5 F (36.9 C) (08/08 0344) Pulse Rate:  [79-113] 99 (08/08 0344) Resp:  [17-20] 18 (08/08 0344) BP: (104-116)/(56-66) 110/58 (08/08 0344) SpO2:  [95 %-99 %] 98 % (08/08 0344) Weight:  [139.7 kg] 139.7 kg (08/08  0344) Physical Exam: General: Obese woman sitting in bed, comfortable, in good spirits, NAD Cardiovascular: Tachycardic, regular rhythm, no murmurs Respiratory: CTAB, no wheezes or rales, breathing comfortably on room air Abdomen: Soft, appropriately tender  diffusely primarily LLQ and midline at surgical sites, +BS, wound-vac in place, JP draining small amount of brownish liquid, G-tube in place with scant brown drainage, some leakage around G-tube Extremities: Pitting edema +3 to above the knee  Laboratory: Recent Labs  Lab 02/18/20 1730 02/19/20 0058 02/20/20 0327  WBC 13.5* 13.0* 13.1*  HGB 7.4* 7.0* 7.0*  HCT 23.4* 22.6* 23.2*  PLT 260 263 274   Recent Labs  Lab 02/18/20 0601 02/18/20 0601 02/19/20 0058 02/19/20 0058 02/19/20 0750 02/19/20 1526 02/20/20 0327  NA 135   < > 135   < > 136 135 134*  K 2.8*   < > 3.3*   < > 3.2* 3.4* 3.5  CL 102   < > 104   < > 106 104 104  CO2 20*   < > 21*   < > 22 20* 22  BUN <5*   < > <5*   < > <5* <5* <5*  CREATININE 0.65   < > 0.51   < > 0.54 0.55 0.51  CALCIUM 7.9*   < > 7.8*   < > 7.8* 7.5* 7.1*  PROT 5.6*  --  5.3*  --   --   --  5.4*  BILITOT 1.4*  --  0.8  --   --   --  1.0  ALKPHOS 422*  --  378*  --   --   --  376*  ALT 18  --  15  --   --   --  15  AST 20  --  15  --   --   --  17  GLUCOSE 108*   < > 114*   < > 146* 120* 117*   < > = values in this interval not displayed.    Imaging/Diagnostic Tests: CT A/P with no leak from roux limb and no free air, increased bilateral lobe atelectasis.  Lower venous DVT study negative.  Gladys Damme, MD 02/20/2020, 6:09 AM PGY-2, [facility] Intern pager: [phone], text pages welcome

## 2020-02-20 NOTE — Plan of Care (Signed)
  Problem: Clinical Measurements: Goal: Respiratory complications will improve Outcome: Progressing Note: Pt maintaining good oxygen saturations on room air   Problem: Activity: Goal: Risk for activity intolerance will decrease Outcome: Progressing Note: Pt is mobilizing up to chair everyday   Problem: Nutrition: Goal: Adequate nutrition will be maintained Outcome: Progressing Note: Pt is tolerating bariatric full liquid diet well.

## 2020-02-20 NOTE — Progress Notes (Signed)
Pt c/o of fingertips tingling. MD notified. Per pt tingling went away after Mg infusion. Will continue to monitor the pt. Hoover Brunette, RN

## 2020-02-20 NOTE — Progress Notes (Signed)
Patient ID: Alexis Avila, female   DOB: Apr 09, 1981, 39 y.o.   MRN: [id number] [facility] Surgery Progress Note:   6 Days Post-Op  Subjective: Mental status is alert .  Complaints none except her legs. Objective: Vital signs in last 24 hours: Temp:  [98.3 F (36.8 C)-99.3 F (37.4 C)] 98.3 F (36.8 C) (08/08 0925) Pulse Rate:  [79-106] 92 (08/08 0925) Resp:  [17-18] 18 (08/08 0925) BP: (99-114)/(53-62) 99/53 (08/08 0925) SpO2:  [95 %-99 %] 98 % (08/08 0925) Weight:  [139.7 kg-140.1 kg] 140.1 kg (08/08 0600)  Intake/Output from previous day: 08/07 0701 - 08/08 0700 In: 1260 [P.O.:1220; I.V.:40] Out: 620 [Drains:620] Intake/Output this shift: Total I/O In: -  Out: 550 [Urine:550]  Physical Exam: Work of breathing is not labored;  G tube with green drainage; JP with sero cloudy,   Lab Results:  Results for orders placed or performed during the hospital encounter of 02/09/20 (from the past 48 hour(s))  CBC     Status: Abnormal   Collection Time: 02/18/20  5:30 PM  Result Value Ref Range   WBC 13.5 (H) 4.0 - 10.5 K/uL   RBC 2.73 (L) 3.87 - 5.11 MIL/uL   Hemoglobin 7.4 (L) 12.0 - 15.0 g/dL   HCT 23.4 (L) 36 - 46 %   MCV 85.7 80.0 - 100.0 fL   MCH 27.1 26.0 - 34.0 pg   MCHC 31.6 30.0 - 36.0 g/dL   RDW 20.2 (H) 11.5 - 15.5 %   Platelets 260 150 - 400 K/uL   nRBC 2.1 (H) 0.0 - 0.2 %    Comment: Performed at [facility], [address] [place], [place] [address]  Comprehensive metabolic panel     Status: Abnormal   Collection Time: 02/19/20 12:58 AM  Result Value Ref Range   Sodium 135 135 - 145 mmol/L   Potassium 3.3 (L) 3.5 - 5.1 mmol/L   Chloride 104 98 - 111 mmol/L   CO2 21 (L) 22 - 32 mmol/L   Glucose, Bld 114 (H) 70 - 99 mg/dL    Comment: Glucose reference range applies only to samples taken after fasting for at least 8 hours.   BUN <5 (L) 6 - 20 mg/dL   Creatinine, Ser 0.51 0.44 - 1.00 mg/dL   Calcium 7.8 (L) 8.9 - 10.3 mg/dL   Total Protein 5.3 (L)  6.5 - 8.1 g/dL   Albumin 1.6 (L) 3.5 - 5.0 g/dL   AST 15 15 - 41 U/L   ALT 15 0 - 44 U/L   Alkaline Phosphatase 378 (H) 38 - 126 U/L   Total Bilirubin 0.8 0.3 - 1.2 mg/dL   GFR calc non Af Amer >60 >60 mL/min   GFR calc Af Amer >60 >60 mL/min   Anion gap 10 5 - 15    Comment: Performed at [facility], [place] [address] [place], [place] [address]  CBC     Status: Abnormal   Collection Time: 02/19/20 12:58 AM  Result Value Ref Range   WBC 13.0 (H) 4.0 - 10.5 K/uL   RBC 2.61 (L) 3.87 - 5.11 MIL/uL   Hemoglobin 7.0 (L) 12.0 - 15.0 g/dL   HCT 22.6 (L) 36 - 46 %   MCV 86.6 80.0 - 100.0 fL   MCH 26.8 26.0 - 34.0 pg   MCHC 31.0 30.0 - 36.0 g/dL   RDW 20.5 (H) 11.5 - 15.5 %   Platelets 263 150 - 400 K/uL   nRBC  2.3 (H) 0.0 - 0.2 %    Comment: Performed at [facility], [place] [address], [place], [place] [address]  Magnesium     Status: None   Collection Time: 02/19/20 12:58 AM  Result Value Ref Range   Magnesium 1.7 1.7 - 2.4 mg/dL    Comment: Performed at [facility] [address] [place], [place] [address]  Phosphorus     Status: Abnormal   Collection Time: 02/19/20 12:58 AM  Result Value Ref Range   Phosphorus <1.0 (LL) 2.5 - 4.6 mg/dL    Comment: CRITICAL RESULT CALLED TO, READ BACK BY AND VERIFIED WITH: RN H PITTMAN @0240  02/19/20 BY S GEZAHEGN Performed at [facility], [place] [address], [place], [place] [address]   Renal function panel     Status: Abnormal   Collection Time: 02/19/20  7:50 AM  Result Value Ref Range   Sodium 136 135 - 145 mmol/L   Potassium 3.2 (L) 3.5 - 5.1 mmol/L   Chloride 106 98 - 111 mmol/L   CO2 22 22 - 32 mmol/L   Glucose, Bld 146 (H) 70 - 99 mg/dL    Comment: Glucose reference range applies only to samples taken after fasting for at least 8 hours.   BUN <5 (L) 6 - 20 mg/dL   Creatinine, Ser 0.54 0.44 - 1.00 mg/dL   Calcium 7.8 (L) 8.9 - 10.3 mg/dL   Phosphorus <1.0 (LL) 2.5 - 4.6 mg/dL    Comment: CONSISTENT WITH PREVIOUS  RESULT CRITICAL RESULT CALLED TO, READ BACK BY AND VERIFIED WITH: MARGE LESSARD RN.@0901  ON 8.7.21 BY TCALDWELL MT.    Albumin 1.7 (L) 3.5 - 5.0 g/dL   GFR calc non Af Amer >60 >60 mL/min   GFR calc Af Amer >60 >60 mL/min   Anion gap 8 5 - 15    Comment: Performed at [facility] [address], [place], [place] [address]  Lipase, blood     Status: Abnormal   Collection Time: 02/19/20  7:50 AM  Result Value Ref Range   Lipase 202 (H) 11 - 51 U/L    Comment: Performed at [facility] [address] [place], [place] [address]  Amylase     Status: Abnormal   Collection Time: 02/19/20  7:50 AM  Result Value Ref Range   Amylase 137 (H) 28 - 100 U/L    Comment: Performed at [facility], [place] [address], [place],  [address]  Renal function panel     Status: Abnormal   Collection Time: 02/19/20  3:26 PM  Result Value Ref Range   Sodium 135 135 - 145 mmol/L   Potassium 3.4 (L) 3.5 - 5.1 mmol/L   Chloride 104 98 - 111 mmol/L   CO2 20 (L) 22 - 32 mmol/L   Glucose, Bld 120 (H) 70 - 99 mg/dL    Comment: Glucose reference range applies only to samples taken after fasting for at least 8 hours.   BUN <5 (L) 6 - 20 mg/dL   Creatinine, Ser 0.55 0.44 - 1.00 mg/dL   Calcium 7.5 (L) 8.9 - 10.3 mg/dL   Phosphorus 1.5 (L) 2.5 - 4.6 mg/dL   Albumin 1.7 (L) 3.5 - 5.0 g/dL   GFR calc non Af Amer >60 >60 mL/min   GFR calc Af Amer >60 >60 mL/min   Anion gap 11 5 - 15    Comment: Performed at [facility]  [address], [place], [place] [address]  Comprehensive metabolic panel     Status: Abnormal   Collection Time: 02/20/20  3:27 AM  Result Value Ref Range   Sodium 134 (L) 135 - 145 mmol/L   Potassium 3.5 3.5 - 5.1 mmol/L   Chloride 104 98 - 111 mmol/L   CO2 22 22 - 32 mmol/L   Glucose, Bld 117 (H) 70 - 99 mg/dL    Comment: Glucose reference range applies only to samples taken after fasting for at least 8 hours.   BUN <5 (L) 6 - 20 mg/dL   Creatinine, Ser 0.51  0.44 - 1.00 mg/dL   Calcium 7.1 (L) 8.9 - 10.3 mg/dL   Total Protein 5.4 (L) 6.5 - 8.1 g/dL   Albumin 1.7 (L) 3.5 - 5.0 g/dL   AST 17 15 - 41 U/L   ALT 15 0 - 44 U/L   Alkaline Phosphatase 376 (H) 38 - 126 U/L   Total Bilirubin 1.0 0.3 - 1.2 mg/dL   GFR calc non Af Amer >60 >60 mL/min   GFR calc Af Amer >60 >60 mL/min   Anion gap 8 5 - 15    Comment: Performed at [facility] [address] [place], [place] [address]  CBC     Status: Abnormal   Collection Time: 02/20/20  3:27 AM  Result Value Ref Range   WBC 13.1 (H) 4.0 - 10.5 K/uL   RBC 2.60 (L) 3.87 - 5.11 MIL/uL   Hemoglobin 7.0 (L) 12.0 - 15.0 g/dL   HCT 23.2 (L) 36 - 46 %   MCV 89.2 80.0 - 100.0 fL   MCH 26.9 26.0 - 34.0 pg   MCHC 30.2 30.0 - 36.0 g/dL   RDW 21.7 (H) 11.5 - 15.5 %   Platelets 274 150 - 400 K/uL   nRBC 2.7 (H) 0.0 - 0.2 %    Comment: Performed at [facility] [address], [place], [place] [address]  Phosphorus     Status: Abnormal   Collection Time: 02/20/20  3:27 AM  Result Value Ref Range   Phosphorus 1.4 (L) 2.5 - 4.6 mg/dL    Comment: Performed at [facility] [address], [place], [place] [address]  Magnesium     Status: Abnormal   Collection Time: 02/20/20 10:22 AM  Result Value Ref Range   Magnesium 1.6 (L) 1.7 - 2.4 mg/dL    Comment: Performed at [facility] [address], [place], [place] [address]    Radiology/Results: DG Chest 2 View  Result Date: 02/18/2020 CLINICAL DATA:  Shortness of breath EXAM: CHEST - 2 VIEW COMPARISON:  February 15, 2020 FINDINGS: Evaluation is limited secondary to poor penetration. The cardiomediastinal silhouette is unchanged and enlarged in contour.Low lung volumes. Likely small LEFT pleural effusion. No pneumothorax. LEFT basilar opacity partially obscures the LEFT hemidiaphragm. Visualized abdomen is unremarkable. No acute osseous abnormality visualized. IMPRESSION: LEFT basilar opacity partially obscures the LEFT hemidiaphragm,  likely a combination of atelectasis/consolidation and pleural fluid. 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 Recent pancreatitis. Previous gastric bypass surgery. EXAM: CT ABDOMEN AND PELVIS WITH CONTRAST TECHNIQUE: Multidetector CT imaging of the abdomen and pelvis was performed using the standard protocol following bolus administration of intravenous contrast. CONTRAST:  168mL OMNIPAQUE  IOHEXOL 300 MG/ML  SOLN COMPARISON:  02/09/2020 FINDINGS: Lower Chest: Increased bilateral lower lobe atelectasis and tiny left pleural effusion noted. Hepatobiliary: No hepatic masses identified. Prior cholecystectomy. No evidence of biliary obstruction. Pancreas: Multiple small retroperitoneal fluid collections are seen adjacent to the pancreatic tail and in the left posterior pararenal space, which are unchanged and consistent with small pseudocysts. Spleen: Within normal limits in size and appearance. Adrenals/Urinary Tract: No masses identified. No evidence of ureteral calculi or hydronephrosis. Stomach/Bowel: Prior gastric bypass surgery again noted. New percutaneous gastrostomy tube is seen in the bypassed portion of the stomach, which now shows decreased dilatation. Decreased dilatation of the afferent loop is also seen rim enhancing fluid and gas collection in the left upper quadrant mesentery has decreased in size, currently measuring 6.8 x 4.2 cm on image 34/3, compared to 9.9 by 5.9 cm previously. No evidence of extravasation of oral contrast material or free intraperitoneal air. Left abdominal surgical drain is also seen in place. Vascular/Lymphatic: No pathologically enlarged lymph nodes. No abdominal aortic aneurysm. Reproductive: Stable 3.8 cm posterior uterine fibroid. Adnexal regions are unremarkable. Other:  Increased diffuse body wall  edema. Musculoskeletal:  No suspicious bone lesions identified. IMPRESSION: New percutaneous gastrostomy tube in appropriate position, with decreased dilatation of bypassed stomach and afferent small bowel. No evidence of extravasation of oral contrast material or free intraperitoneal air. Decreased size of left upper quadrant mesenteric fluid collection. Stable small pseudocysts in left abdominal retroperitoneum. Increased bilateral lower lobe atelectasis, tiny left pleural effusion, and diffuse body wall edema. Stable 3.8 cm posterior uterine fibroid. Electronically Signed   By: Marlaine Hind M.D.   On: 02/18/2020 16:15   VAS [place] LOWER EXTREMITY VENOUS (DVT)  Result Date: 02/19/2020  Lower Venous DVTStudy Indications: Swelling, and Pain.  Limitations: Body habitus, poor ultrasound/tissue interface and patient positioning, patient pain tolerance. Comparison Study: No prior studies. Performing Technologist: Oliver Hum RVT  Examination Guidelines: A complete evaluation includes B-mode imaging, spectral Doppler, color Doppler, and power Doppler as needed of all accessible portions of each vessel. Bilateral testing is considered an integral part of a complete examination. Limited examinations for reoccurring indications may be performed as noted. The reflux portion of the exam is performed with the patient in reverse Trendelenburg.  +---------+---------------+---------+-----------+----------+--------------+ RIGHT    CompressibilityPhasicitySpontaneityPropertiesThrombus Aging +---------+---------------+---------+-----------+----------+--------------+ CFV      Full           Yes      Yes                                 +---------+---------------+---------+-----------+----------+--------------+ SFJ      Full                                                        +---------+---------------+---------+-----------+----------+--------------+ FV Prox  Full                                                         +---------+---------------+---------+-----------+----------+--------------+ FV Mid                  Yes  Yes                                 +---------+---------------+---------+-----------+----------+--------------+ FV Distal               Yes      Yes                                 +---------+---------------+---------+-----------+----------+--------------+ PFV                                                   Not visualized +---------+---------------+---------+-----------+----------+--------------+ POP      Full           Yes      Yes                                 +---------+---------------+---------+-----------+----------+--------------+ PTV      Full                                                        +---------+---------------+---------+-----------+----------+--------------+ PERO                                                  Not visualized +---------+---------------+---------+-----------+----------+--------------+   +---------+---------------+---------+-----------+----------+--------------+ LEFT     CompressibilityPhasicitySpontaneityPropertiesThrombus Aging +---------+---------------+---------+-----------+----------+--------------+ CFV      Full           Yes      Yes                                 +---------+---------------+---------+-----------+----------+--------------+ SFJ      Full                                                        +---------+---------------+---------+-----------+----------+--------------+ FV Prox  Full                                                        +---------+---------------+---------+-----------+----------+--------------+ FV Mid                  Yes      Yes                                 +---------+---------------+---------+-----------+----------+--------------+ FV Distal               Yes      Yes                                  +---------+---------------+---------+-----------+----------+--------------+  PFV                                                   Not visualized +---------+---------------+---------+-----------+----------+--------------+ POP      Full           Yes      Yes                                 +---------+---------------+---------+-----------+----------+--------------+ PTV      Full                                                        +---------+---------------+---------+-----------+----------+--------------+ PERO     Full                                                        +---------+---------------+---------+-----------+----------+--------------+     Summary: RIGHT: - There is no evidence of deep vein thrombosis in the lower extremity. However, portions of this examination were limited- see technologist comments above.  - No cystic structure found in the popliteal fossa.  LEFT: - There is no evidence of deep vein thrombosis in the lower extremity. However, portions of this examination were limited- see technologist comments above.  - No cystic structure found in the popliteal fossa.  *See table(s) above for measurements and observations. Electronically signed by Harold Barban MD on 02/19/2020 at 1:18:24 AM.    Final    [place] EKG SITE RITE  Result Date: 02/19/2020 If Site Rite image not attached, placement could not be confirmed due to current cardiac rhythm.   Anti-infectives: Anti-infectives (From admission, onward)   Start     Dose/Rate Route Frequency Ordered Stop   02/14/20 0800  metroNIDAZOLE (FLAGYL) IVPB 500 mg        500 mg 100 mL/hr over 60 Minutes Intravenous Every 8 hours 02/14/20 0131 02/19/20 2359   02/14/20 0230  cefTRIAXone (ROCEPHIN) 2 g in sodium chloride 0.9 % 100 mL IVPB        2 g 200 mL/hr over 30 Minutes Intravenous Daily at bedtime 02/14/20 0130 02/19/20 2300   02/14/20 0130  metroNIDAZOLE (FLAGYL) IVPB 500 mg        500 mg 100 mL/hr over 60 Minutes  Intravenous  Once 02/14/20 0121 02/14/20 0235   02/12/20 0800  vancomycin (VANCOCIN) IVPB 1000 mg/200 mL premix        1,000 mg 200 mL/hr over 60 Minutes Intravenous To Radiology 02/11/20 1619 02/12/20 1105      Assessment/Plan: Problem List: Patient Active Problem List   Diagnosis Date Noted  . SOB (shortness of breath)   . Lower extremity edema   . Dyspnea   . Status post exploratory laparotomy   . Abdominal pain   . Elevated LFTs   . Marginal ulcers   . History of Roux-en-Y gastric bypass   . Intractable nausea and vomiting 02/09/2020  . Hypokalemia   . Pseudocyst of pancreas   .  SBO (small bowel obstruction) ([place])   . History of pancreatitis 07/20/2019  . Status post gastric bypass for obesity 07/20/2019  . Anemia, B12 deficiency 07/20/2019  . Current smoker 07/20/2019  . Chronic back pain 07/20/2019  . Ovarian cyst 07/20/2019  . GERD (gastroesophageal reflux disease) 07/20/2019  . Morbid obesity ([place]) 07/20/2019    Taking bariatric full liquids.  Stable-will discuss disposition with Dr. [name].  6 Days Post-Op    LOS: 10 days   Matt B. Hassell Done, MD, [facility] [phone] to reach the surgeon on call.    02/20/2020 11:56 AM

## 2020-02-21 ENCOUNTER — Inpatient Hospital Stay (HOSPITAL_COMMUNITY): Payer: BC Managed Care – PPO

## 2020-02-21 DIAGNOSIS — R109 Unspecified abdominal pain: Secondary | ICD-10-CM

## 2020-02-21 LAB — COMPREHENSIVE METABOLIC PANEL
ALT: 13 U/L (ref 0–44)
AST: 18 U/L (ref 15–41)
Albumin: 1.6 g/dL — ABNORMAL LOW (ref 3.5–5.0)
Alkaline Phosphatase: 337 U/L — ABNORMAL HIGH (ref 38–126)
Anion gap: 8 (ref 5–15)
BUN: <5 mg/dL — ABNORMAL LOW (ref 6–20)
CO2: 23 mmol/L (ref 22–32)
Calcium: 7.3 mg/dL — ABNORMAL LOW (ref 8.9–10.3)
Chloride: 105 mmol/L (ref 98–111)
Creatinine, Ser: 0.6 mg/dL (ref 0.44–1.00)
GFR calc Af Amer: >60 mL/min (ref >60)
GFR calc non Af Amer: >60 mL/min (ref >60)
Glucose, Bld: 94 mg/dL (ref 70–99)
Potassium: 3.8 mmol/L (ref 3.5–5.1)
Sodium: 136 mmol/L (ref 135–145)
Total Bilirubin: 0.6 mg/dL (ref 0.3–1.2)
Total Protein: 5.5 g/dL — ABNORMAL LOW (ref 6.5–8.1)

## 2020-02-21 LAB — CBC
HCT: 24 % — ABNORMAL LOW (ref 36.0–46.0)
Hemoglobin: 6.9 g/dL — CL (ref 12.0–15.0)
MCH: 25.9 pg — ABNORMAL LOW (ref 26.0–34.0)
MCHC: 28.8 g/dL — ABNORMAL LOW (ref 30.0–36.0)
MCV: 90.2 fL (ref 80.0–100.0)
Platelets: 254 10*3/uL (ref 150–400)
RBC: 2.66 MIL/uL — ABNORMAL LOW (ref 3.87–5.11)
RDW: 22.8 % — ABNORMAL HIGH (ref 11.5–15.5)
WBC: 10.5 10*3/uL (ref 4.0–10.5)
nRBC: 2.4 % — ABNORMAL HIGH (ref 0.0–0.2)

## 2020-02-21 LAB — LIPASE, FLUID: Lipase-Fluid: >60600 U/L

## 2020-02-21 LAB — HEMOGLOBIN AND HEMATOCRIT, BLOOD
HCT: 24.9 % — ABNORMAL LOW (ref 36.0–46.0)
Hemoglobin: 7.4 g/dL — ABNORMAL LOW (ref 12.0–15.0)

## 2020-02-21 LAB — PREPARE RBC (CROSSMATCH)

## 2020-02-21 LAB — MAGNESIUM: Magnesium: 1.8 mg/dL (ref 1.7–2.4)

## 2020-02-21 LAB — PHOSPHORUS: Phosphorus: 1.4 mg/dL — ABNORMAL LOW (ref 2.5–4.6)

## 2020-02-21 MED ORDER — ENSURE MAX PROTEIN PO LIQD
11.0000 [oz_av] | Freq: Two times a day (BID) | ORAL | Status: DC
  Administered 2020-02-21: 11 [oz_av] via ORAL
  Filled 2020-02-21 (×3): qty 330

## 2020-02-21 MED ORDER — SODIUM CHLORIDE 0.9% IV SOLUTION: Freq: Once | INTRAVENOUS | Status: AC

## 2020-02-21 MED ORDER — FUROSEMIDE 10 MG/ML IJ SOLN
20.0000 mg | Freq: Once | INTRAMUSCULAR | Status: AC
  Administered 2020-02-21: 20 mg via INTRAVENOUS
  Filled 2020-02-21: qty 2

## 2020-02-21 MED ORDER — PANTOPRAZOLE SODIUM 40 MG PO TBEC
40.0000 mg | DELAYED_RELEASE_TABLET | Freq: Two times a day (BID) | ORAL | Status: DC
  Administered 2020-02-21 – 2020-02-22 (×2): 40 mg via ORAL
  Filled 2020-02-21 (×2): qty 1

## 2020-02-21 MED ORDER — POTASSIUM PHOSPHATES 15 MMOLE/5ML IV SOLN
30.0000 mmol | Freq: Every day | INTRAVENOUS | Status: AC
  Administered 2020-02-21 – 2020-02-23 (×3): 30 mmol via INTRAVENOUS
  Filled 2020-02-21 (×3): qty 10

## 2020-02-21 NOTE — Plan of Care (Signed)
  Problem: Education: Goal: Knowledge of General Education information will improve Description: Including pain rating scale, medication(s)/side effects and non-pharmacologic comfort measures Outcome: Progressing   Problem: Clinical Measurements: Goal: Respiratory complications will improve Outcome: Progressing   

## 2020-02-21 NOTE — Consult Note (Signed)
[place] Nurse wound follow up Patient receiving care in [place].  Wound photo taken by PA. Wound type: surgical Measurement:20 cm x 8 cm x 3.2 cm Wound bed: 100% pink granulation tissue Drainage (amount, consistency, odor) serosanginous in cannister Periwound: intact Dressing procedure/placement/frequency: 2 pieces black foam removed, 2 pieces placed. Immediate seal obtained. Supply for Wednesday requested. Val Riles, RN, MSN, CWOCN, CNS-BC, pager [phone]

## 2020-02-21 NOTE — Progress Notes (Signed)
Family Medicine Teaching Service Daily Progress Note Intern Pager: [phone]  Patient name: Alexis Avila Medical record number: [id number] Date of birth: [birth year] Age: 39 y.o. Gender: female  Primary Care Provider: [name], MD Consultants: GI, Surgery, Wound care Code Status: Full  Pt Overview and Major Events to Date:  Admitted 7/28 7/31- CT guided aspiration of fluid from LUQ by IR  8/2- Exploratory laparotomy, lysis of adhesion, G-tube placement in gastric remnant, Roux limb resection, and drainage of LUQ hematoma with drain placed 8/4- Wound vac changed with IV pain medications   Assessment and Plan: Monti Boltonis a 39 y.o.female with a history of obesity s/p Roux-en-Y gastric bypass, GERD, AUD, and chronic anemia now POD#7 s/p ex-lap for lysis of adhesions > 2 hrs, drainage of intra-abdominal hematoma/abscess, insertion of G tube in remnant stomach, and partial resection/reconstruction of roux limb by Dr. [name] 8/2.  POD#7 s/p ex-lap  Partial SBO  Sepsis intraabdominal abscess - blood cultures NGTD, completed CTX and flagyl (8/2-8/7) - amylase from JP fluid: 58,306 and lipase pending. PA to call me back tomorrow regarding elevated amylase levels  - PT/OT consulted  - G tube in remnant stomach- plan to do contrasted study thru G tube soon.  Waiting on approval from patient's insurance per [facility] surgery.   Metabolic Disarray  C/f refeeding syndrome due to hypophosphatemia and hypokalemia yesterday. Potassium stable today at 3.8 after IV repletion yesterday, chloride stable at 105 sodium stable at 136. Phosphorus improved to 1.4 today. Mg stable 1.8 - Repleted Phos w/ kphos IV today  - Daily CMP, Phos, Mg  Acute blood loss anemia, in setting of chronic anemia Hgb 6.9. s/p I unit pRBC this morning and S/p 1U pRBC transfusion on 8/5. Normal folate and B12 this admission.  - transfusion threshold 7   - AM CBC - Perform ferritin and as outpatient  -  restarted lovenox  Pitting edema +3 pitting edema to above knee.Added lasix 20mg  IV (P >1, K nl) and compression stockings and assess for improvement, UOP. -  IV furosemide 20 mg  - can consider TTE to r/o CHF if not improved - daily weights - strict I/O  Atelectasis Afebrile overnight, no cough. Increased consolidation in bilateral lobes on CT A/P consistent with atelectasis.  - repeat CXR if pt spikes a fever   Marginal gastric ulcer Improving. Large marginal ulcer on EGD, bx with inflammation but negative for H. Pylori. 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 ALP 1,066 >>> 376 >337. ALT/AST wnl.  History of alcohol use disorder Continue education and counseling. CIWAs discontinued.  GERD Chronic, stable. - on PPI as above  FEN/GI: bariatric fulls, advance per surgery PPx: lovenox  Disposition: Home HealthPT, NO OT necessary  Subjective:   No acute events overnight. Patient endorsed being in some pain around wound site but otherwise said she is doing ok. She denied any other concerns or complaints.   Objective: Temp:  [98.3 F (36.8 C)-99.2 F (37.3 C)] 99.2 F (37.3 C) (08/09 0701) Pulse Rate:  [92-101] 101 (08/09 0701) Resp:  [17-20] 18 (08/09 0701) BP: (99-112)/(53-67) 110/55 (08/08 2337) SpO2:  [96 %-100 %] 98 % (08/09 0701) Physical Exam: General: well appearing, in no acute distress  Cardiovascular: RRR. Tachycardic. No murmurs  Respiratory: CTA bilaterally. No wheezes, rales, rhonchi Abdomen: soft, non distended. Tender  Extremities: 3+ pitting edema. JP draining  small amount of brown fluid. There was gauze around g tube from leakage. Wound vac in place     Laboratory: Recent Labs  Lab 02/19/20 0058 02/20/20 0327 02/21/20 0405  WBC 13.0* 13.1* 10.5  HGB 7.0* 7.0* 6.9*  HCT 22.6* 23.2* 24.0*  PLT 263 274 254   Recent Labs   Lab 02/19/20 0058 02/19/20 0750 02/19/20 1526 02/20/20 0327 02/21/20 0405  NA 135   < > 135 134* 136  K 3.3*   < > 3.4* 3.5 3.8  CL 104   < > 104 104 105  CO2 21*   < > 20* 22 23  BUN <5*   < > <5* <5* <5*  CREATININE 0.51   < > 0.55 0.51 0.60  CALCIUM 7.8*   < > 7.5* 7.1* 7.3*  PROT 5.3*  --   --  5.4* 5.5*  BILITOT 0.8  --   --  1.0 0.6  ALKPHOS 378*  --   --  376* 337*  ALT 15  --   --  15 13  AST 15  --   --  17 18  GLUCOSE 114*   < > 120* 117* 94   < > = values in this interval not displayed.     Imaging/Diagnostic Tests:  None today  Theone Stanley, DO 02/21/2020, 7:38 AM PGY-1, [place] Intern pager: [phone], text pages welcome

## 2020-02-21 NOTE — Progress Notes (Signed)
CRITICAL VALUE ALERT  Critical Value:  Hgb 6.9  Date & Time Notified:  8/9 @ 6:20  Provider Notified: Candie Chroman  8/9 @ 6:24 Orders Received/Actions taken: awaiting orders; will continue to monitor

## 2020-02-21 NOTE — Progress Notes (Signed)
Contact information provided to patient.  Questions regarded diet.  Offered patient information of Phase II diet and Vitamin information.  Questions answered.  [place] Bariatric Nurse Coordinator Mobile number [phone]

## 2020-02-21 NOTE — Progress Notes (Signed)
Physical Therapy Treatment Patient Details Name: Alexis Avila MRN: [id number] DOB: 20-Jul-1980 Today's Date: 02/21/2020    History of Present Illness 39 yo female with history of gastric bypass surgery was admitted after having inability to eat, SOB and had UTI.  Went home and returned with dizziness, weakness and gastric pain.  Has partial SBO, low electrolytes, elevated BS, and was seen on 7/29 for enteroscopy, with sepsis symptoms afterward.  Returned to OR for suspected perforation of SB loop, had laparotomy with G-tube on 02/14/20, lysis of adhesions and drainage of IAA done.  Referred to OT.    PT Comments    Pt sitting up in recliner, agreeable to ambulation with therapy. Pt limited in mobility by decreased core strength due to surgical site and increased back muscle recruitment to compensate with ambulation resulting in tightening and discomfort. Pt is supervision for transfers and min guard for ambulation while pushing IV pole. D/c plan appropriate at this time. PT will continue to follow acutely.    Follow Up Recommendations  Home health PT     Equipment Recommendations  None recommended by PT       Precautions / Restrictions Precautions Precautions: Fall;Other (comment) Precaution Comments: monitor her vitals Restrictions Weight Bearing Restrictions: No    Mobility  Bed Mobility               General bed mobility comments: sitting in recliner on entry   Transfers Overall transfer level: Needs assistance Equipment used: None Transfers: Sit to/from Stand Sit to Stand: Supervision         General transfer comment: Sit to stand from recliner supervision and assist for lines and leads  Ambulation/Gait Ambulation/Gait assistance: Min guard Gait Distance (Feet): 300 Feet Assistive device: IV Pole Gait Pattern/deviations: Step-through pattern;Wide base of support;Trunk flexed Gait velocity: reduced   General Gait Details: continues to ambulate with IV pole,  slow steady gait, requires 3x standing rest breaks for stiffness in back and shoulders       Balance Overall balance assessment: Needs assistance Sitting-balance support: Feet supported Sitting balance-Leahy Scale: Good     Standing balance support: No upper extremity supported;During functional activity Standing balance-Leahy Scale: Fair Standing balance comment: able to don face mask in standing without support                            Cognition Arousal/Alertness: Awake/alert Behavior During Therapy: WFL for tasks assessed/performed Overall Cognitive Status: Within Functional Limits for tasks assessed                                        Exercises Other Exercises Other Exercises: shoulder rolls x10 Other Exercises: shoulder retraction x 10     General Comments General comments (skin integrity, edema, etc.): B LE swollen with impression at tops of TED hose, RN notified as to increased tightness      Pertinent Vitals/Pain Pain Assessment: No/denies pain Faces Pain Scale: Hurts a little bit Pain Location: low back  Pain Descriptors / Indicators: Aching Pain Intervention(s): Monitored during session;Heat applied           PT Goals (current goals can now be found in the care plan section) Acute Rehab PT Goals Patient Stated Goal: to get home without pain PT Goal Formulation: With patient/family Time For Goal Achievement: 03/01/20 Progress towards PT goals: Progressing toward goals  Frequency    Min 3X/week      PT Plan Current plan remains appropriate       AM-PAC PT "6 Clicks" Mobility   Outcome Measure  Help needed turning from your back to your side while in a flat bed without using bedrails?: None Help needed moving from lying on your back to sitting on the side of a flat bed without using bedrails?: A Little Help needed moving to and from a bed to a chair (including a wheelchair)?: None Help needed standing up from  a chair using your arms (e.g., wheelchair or bedside chair)?: None Help needed to walk in hospital room?: A Little Help needed climbing 3-5 steps with a railing? : A Lot 6 Click Score: 20    End of Session   Activity Tolerance: Patient tolerated treatment well Patient left: in chair;with call bell/phone within reach Nurse Communication: Mobility status PT Visit Diagnosis: Unsteadiness on feet (R26.81);Other abnormalities of gait and mobility (R26.89)     Time: 6812-7517 PT Time Calculation (min) (ACUTE ONLY): 32 min  Charges:  $Therapeutic Exercise: 23-37 mins                     Aydien Majette B. Migdalia Dk PT, DPT Acute Rehabilitation Services Pager [phone] Office [phone]    [place] 02/21/2020, 6:05 PM

## 2020-02-21 NOTE — Progress Notes (Signed)
Pt with leakage from g tube and jp drain sites. Paged provider on call for CCS. Provider to bedside to assess pt site. Site was assessed dressing was changed and pt was updated. Will continue to monitor pt.

## 2020-02-21 NOTE — Progress Notes (Addendum)
[facility] Progress Note  7 Days Post-Op  Subjective: CC-  Comfortable this morning. Still with some abdominal pain but overall well controlled. Denies n/v. BM yesterday. Did not drink any protein drinks yesterday because she did not like the taste. Hgb 6.9 from 7, getting 1 unit PRBCs.  Objective: Vital signs in last 24 hours: Temp:  [98.5 F (36.9 C)-99.3 F (37.4 C)] 99.3 F (37.4 C) (08/09 0800) Pulse Rate:  [95-101] 99 (08/09 0800) Resp:  [17-20] 18 (08/09 0800) BP: (104-112)/(55-67) 108/59 (08/09 0800) SpO2:  [96 %-100 %] 96 % (08/09 0800) Last BM Date: 02/20/20  Intake/Output from previous day: 08/08 0701 - 08/09 0700 In: 40 [P.O.:660; I.V.:20] Out: 3120 [Urine:2250; Drains:870] Intake/Output this shift: Total I/O In: -  Out: 80 [Drains:80]  PE: Gen: Alert, NAD, pleasant Card:mild tachycardia with regular rhythm Pulm: CTAB, no W/R/R, effort normal.  Abd: Soft,mild distension, tender over drain. few BS heard. Open midline wound light red with no erythema or drainage (pictured below). JP in place with light brown drainage in bulb. G-tube to gravity bilious fluid bag Psych: A&Ox3  Skin: no rashes noted, warm and dry     Lab Results:  Recent Labs    02/20/20 0327 02/21/20 0405  WBC 13.1* 10.5  HGB 7.0* 6.9*  HCT 23.2* 24.0*  PLT 274 254   BMET Recent Labs    02/20/20 0327 02/21/20 0405  NA 134* 136  K 3.5 3.8  CL 104 105  CO2 22 23  GLUCOSE 117* 94  BUN <5* <5*  CREATININE 0.51 0.60  CALCIUM 7.1* 7.3*   PT/INR No results for input(s): LABPROT, INR in the last 72 hours. CMP     Component Value Date/Time   NA 136 02/21/2020 0405   K 3.8 02/21/2020 0405   CL 105 02/21/2020 0405   CO2 23 02/21/2020 0405   GLUCOSE 94 02/21/2020 0405   BUN <5 (L) 02/21/2020 0405   CREATININE 0.60 02/21/2020 0405   CALCIUM 7.3 (L) 02/21/2020 0405   PROT 5.5 (L) 02/21/2020 0405   ALBUMIN 1.6 (L) 02/21/2020 0405   AST 18 02/21/2020 0405    ALT 13 02/21/2020 0405   ALKPHOS 337 (H) 02/21/2020 0405   BILITOT 0.6 02/21/2020 0405   GFRNONAA >60 02/21/2020 0405   GFRAA >60 02/21/2020 0405   Lipase     Component Value Date/Time   LIPASE 202 (H) 02/19/2020 0750       Studies/Results: [place] EKG SITE RITE  Result Date: 02/19/2020 If Site Rite image not attached, placement could not be confirmed due to current cardiac rhythm.   Anti-infectives: Anti-infectives (From admission, onward)   Start     Dose/Rate Route Frequency Ordered Stop   02/14/20 0800  metroNIDAZOLE (FLAGYL) IVPB 500 mg        500 mg 100 mL/hr over 60 Minutes Intravenous Every 8 hours 02/14/20 0131 02/19/20 2359   02/14/20 0230  cefTRIAXone (ROCEPHIN) 2 g in sodium chloride 0.9 % 100 mL IVPB        2 g 200 mL/hr over 30 Minutes Intravenous Daily at bedtime 02/14/20 0130 02/19/20 2300   02/14/20 0130  metroNIDAZOLE (FLAGYL) IVPB 500 mg        500 mg 100 mL/hr over 60 Minutes Intravenous  Once 02/14/20 0121 02/14/20 0235   02/12/20 0800  vancomycin (VANCOCIN) IVPB 1000 mg/200 mL premix        1,000 mg 200 mL/hr over 60 Minutes Intravenous To Radiology 02/11/20 1619 02/12/20 1105  Assessment/Plan Marginal ulcer -continue carafate and protonix crushed - not taken whole.  ABL Anemia - hgb6.9 this AM, getting 1 unit PRBCs  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 Left abdominal wall hematoma/abscess with possible perforation. Serosal tear, Roux limb -S/p exp lap, lysis of adhesions >2hrs, drainage of intra-abdominal hematoma/abscess, insertion of G tube in remnant stomach, partial resection/reconstruction of roux limb 8/2 Dr [name] - POD #7 - CT 8/6 showed no leak and no free air, surgical drain in appropriate location near L retroperitoneal fluid collection - continue JP drain and monitor output >> fluid sent for amylase (58,306) and lipase (pending) -G-tube in  remnant stomachto gravity. Cannotuse g tube for feeds since status of BP limb is unclear -Mobilize, PT. Rec HH - WOCN for wound vac changeM/W/F  FEN - IVF, Bariatric fulls ID - Rocephin/Flagyl 8/2-8/7. Afebrile. WBC10.5, afebrile VTE prophylaxis - scds,lovenox Foley - out Follow-Up - Dr. [name]  Plan: Will ask dietician to see. Continue bariatric fulls. Will try intermittently clamping G tube every 2 hours today.   LOS: 11 days    [facility] 02/21/2020, 10:01 AM Please see Amion for pager number during day hours 7:00am-4:30pm

## 2020-02-21 NOTE — Progress Notes (Signed)
Occupational Therapy Treatment Patient Details Name: Alexis Avila MRN: [id number] DOB: 1981/07/09 Today's Date: 02/21/2020    History of present illness 39 yo female with history of gastric bypass surgery was admitted after having inability to eat, SOB and had UTI.  Went home and returned with dizziness, weakness and gastric pain.  Has partial SBO, low electrolytes, elevated BS, and was seen on 7/29 for enteroscopy, with sepsis symptoms afterward.  Returned to OR for suspected perforation of SB loop, had laparotomy with G-tube on 02/14/20, lysis of adhesions and drainage of IAA done.  Referred to OT.   OT comments  Treatment session with focus on self-care re-education with use of AE including reacher, sock-aid, toilet tongues and long-handled sponge. Patient declined use of sock-aid stating she could depend on family to assist with donning/doffing footwear. Patient also declined use of toilet tongues. OT provided education on attachable bidet to increase independence and decrease need for assistance from others with patient expressing verbal understanding. Patient would benefit from continued acute OT services to maximize safety and independence with self-care tasks in prep for d/c home with family.    Follow Up Recommendations  No OT follow up;Supervision - Intermittent    Equipment Recommendations  Tub/shower seat;3 in 1 bedside commode;Other (comment) (Patient would benefit from bariatric [facility])    Recommendations for Other Services      Precautions / Restrictions Precautions Precautions: Fall;Other (comment) Precaution Comments: monitor her vitals Restrictions Weight Bearing Restrictions: No       Mobility Bed Mobility               General bed mobility comments: Patient seated in recliner upon entry.   Transfers Overall transfer level: Needs assistance Equipment used: None Transfers: Sit to/from Stand Sit to Stand: Supervision         General transfer comment: Sit  to stand from recliner and [facility] with assist for line management.     Balance Overall balance assessment: Needs assistance Sitting-balance support: Feet supported Sitting balance-Leahy Scale: Good     Standing balance support: No upper extremity supported;During functional activity Standing balance-Leahy Scale: Fair                             ADL either performed or assessed with clinical judgement   ADL Overall ADL's : Needs assistance/impaired         Upper Body Bathing: Set up;Sitting Upper Body Bathing Details (indicate cue type and reason): Able to bathe chest, abdomen, and R/L arms seated in recliner with set-up assist.  Lower Body Bathing: Maximal assistance;Sitting/lateral leans;Sit to/from stand Lower Body Bathing Details (indicate cue type and reason): Patient able to wash bilateral upper legs and front perineal area but requred assist to bathe buttocks, bilateral lower legs and feet in sitting/standing.  Upper Body Dressing : Set up;Sitting;Standing Upper Body Dressing Details (indicate cue type and reason): To don anterior hospital gown Lower Body Dressing: Maximal assistance;Sitting/lateral leans;Sit to/from stand Lower Body Dressing Details (indicate cue type and reason): Patient able to doff socks with use of reacher but required assist to don new socks. Patient prefers not to use sock-aid.  Toilet Transfer: Technical brewer Details (indicate cue type and reason): Min guard and line management for SPT to [facility] Toileting- Clothing Manipulation and Hygiene: Moderate assistance;Sitting/lateral lean;Sit to/from stand Toileting - Clothing Manipulation Details (indicate cue type and reason): Mod A for hygiene/clothing management after BM. Patient unable to reach buttocks requiring  assist. Patient declined use of toilet tongues.              Vision       Perception     Praxis      Cognition Arousal/Alertness: Awake/alert Behavior During  Therapy: WFL for tasks assessed/performed Overall Cognitive Status: Within Functional Limits for tasks assessed                                          Exercises     Shoulder Instructions       General Comments      Pertinent Vitals/ Pain       Pain Assessment: Faces Faces Pain Scale: Hurts little more Pain Location: abdomen Pain Descriptors / Indicators: Aching Pain Intervention(s): Monitored during session  Home Living                                          Prior Functioning/Environment              Frequency           Progress Toward Goals  OT Goals(current goals can now be found in the care plan section)  Progress towards OT goals: Progressing toward goals  Acute Rehab OT Goals Patient Stated Goal: to get home without pain ADL Goals Pt Will Perform Grooming: Independently;standing Pt Will Perform Upper Body Bathing: Independently;standing;sitting Pt Will Perform Lower Body Bathing: sitting/lateral leans;sit to/from stand;with modified independence;with adaptive equipment Pt Will Perform Upper Body Dressing: Independently;sitting;standing Pt Will Perform Lower Body Dressing: with modified independence;with adaptive equipment;sitting/lateral leans;sit to/from stand Pt Will Transfer to Toilet: Independently;regular height toilet;ambulating Pt Will Perform Toileting - Clothing Manipulation and hygiene: Independently;sitting/lateral leans;sit to/from stand Pt Will Perform Tub/Shower Transfer: Tub transfer;Shower transfer;ambulating;Independently Pt/caregiver will Perform Home Exercise Program: Increased ROM;Increased strength;Both right and left upper extremity;With theraband;Independently;With written HEP provided Additional ADL Goal #1: Pt will verbalize/demonstrate 3 energy conservation strategies to incorporate into ADL routine. Additional ADL Goal #2: Pt will perform all work-related tasks with independence.  Plan  Discharge plan remains appropriate    Co-evaluation                 AM-PAC OT "6 Clicks" Daily Activity     Outcome Measure   Help from another person eating meals?: None Help from another person taking care of personal grooming?: A Little Help from another person toileting, which includes using toliet, bedpan, or urinal?: A Little Help from another person bathing (including washing, rinsing, drying)?: A Lot Help from another person to put on and taking off regular upper body clothing?: A Little Help from another person to put on and taking off regular lower body clothing?: A Lot 6 Click Score: 17    End of Session    OT Visit Diagnosis: Muscle weakness (generalized) (M62.81);Pain;Unsteadiness on feet (R26.81)   Activity Tolerance Patient tolerated treatment well   Patient Left in chair;with call bell/phone within reach;with family/visitor present   Nurse Communication          Time: 4854-6270 OT Time Calculation (min): 30 min  Charges: OT General Charges $OT Visit: 1 Visit OT Treatments $Self Care/Home Management : 23-37 mins  Jermiah Howton H. OTR/L Supplemental OT, Department of rehab services [phone]   Nyah Shepherd R H. 02/21/2020, 3:11 PM

## 2020-02-22 ENCOUNTER — Encounter (HOSPITAL_COMMUNITY): Payer: Self-pay | Admitting: Family Medicine

## 2020-02-22 LAB — COMPREHENSIVE METABOLIC PANEL
ALT: 14 U/L (ref 0–44)
AST: 19 U/L (ref 15–41)
Albumin: 1.7 g/dL — ABNORMAL LOW (ref 3.5–5.0)
Alkaline Phosphatase: 334 U/L — ABNORMAL HIGH (ref 38–126)
Anion gap: 9 (ref 5–15)
BUN: <5 mg/dL — ABNORMAL LOW (ref 6–20)
CO2: 22 mmol/L (ref 22–32)
Calcium: 7.5 mg/dL — ABNORMAL LOW (ref 8.9–10.3)
Chloride: 103 mmol/L (ref 98–111)
Creatinine, Ser: 0.6 mg/dL (ref 0.44–1.00)
GFR calc Af Amer: >60 mL/min (ref >60)
GFR calc non Af Amer: >60 mL/min (ref >60)
Glucose, Bld: 114 mg/dL — ABNORMAL HIGH (ref 70–99)
Potassium: 3.8 mmol/L (ref 3.5–5.1)
Sodium: 134 mmol/L — ABNORMAL LOW (ref 135–145)
Total Bilirubin: 0.6 mg/dL (ref 0.3–1.2)
Total Protein: 5.5 g/dL — ABNORMAL LOW (ref 6.5–8.1)

## 2020-02-22 LAB — TYPE AND SCREEN
ABO/RH(D): A POS
Antibody Screen: NEGATIVE
Unit division: 0

## 2020-02-22 LAB — CBC
HCT: 25.8 % — ABNORMAL LOW (ref 36.0–46.0)
Hemoglobin: 7.6 g/dL — ABNORMAL LOW (ref 12.0–15.0)
MCH: 27 pg (ref 26.0–34.0)
MCHC: 29.5 g/dL — ABNORMAL LOW (ref 30.0–36.0)
MCV: 91.5 fL (ref 80.0–100.0)
Platelets: 232 10*3/uL (ref 150–400)
RBC: 2.82 MIL/uL — ABNORMAL LOW (ref 3.87–5.11)
RDW: 21.8 % — ABNORMAL HIGH (ref 11.5–15.5)
WBC: 9.9 10*3/uL (ref 4.0–10.5)
nRBC: 1.2 % — ABNORMAL HIGH (ref 0.0–0.2)

## 2020-02-22 LAB — BPAM RBC
Blood Product Expiration Date: 202108272359
ISSUE DATE / TIME: 202108091218
Unit Type and Rh: 6200

## 2020-02-22 LAB — PHOSPHORUS: Phosphorus: 1.6 mg/dL — ABNORMAL LOW (ref 2.5–4.6)

## 2020-02-22 LAB — TOTAL BILIRUBIN, BODY FLUID: Total bilirubin, fluid: 2 mg/dL

## 2020-02-22 LAB — MAGNESIUM: Magnesium: 1.6 mg/dL — ABNORMAL LOW (ref 1.7–2.4)

## 2020-02-22 MED ORDER — ADULT MULTIVITAMIN W/MINERALS CH
1.0000 | ORAL_TABLET | Freq: Every day | ORAL | Status: DC
  Administered 2020-02-23: 1 via ORAL
  Filled 2020-02-22: qty 1

## 2020-02-22 MED ORDER — FUROSEMIDE 10 MG/ML IJ SOLN
20.0000 mg | Freq: Once | INTRAMUSCULAR | Status: AC
  Administered 2020-02-22: 20 mg via INTRAVENOUS
  Filled 2020-02-22: qty 2

## 2020-02-22 MED ORDER — PANTOPRAZOLE SODIUM 40 MG PO PACK
40.0000 mg | PACK | Freq: Two times a day (BID) | ORAL | Status: DC
  Administered 2020-02-22 – 2020-02-23 (×3): 40 mg via ORAL
  Filled 2020-02-22 (×5): qty 20

## 2020-02-22 MED ORDER — ENSURE MAX PROTEIN PO LIQD
11.0000 [oz_av] | Freq: Two times a day (BID) | ORAL | Status: DC
  Administered 2020-02-22: 11 [oz_av] via ORAL
  Filled 2020-02-22 (×5): qty 330

## 2020-02-22 MED ORDER — ENSURE ENLIVE PO LIQD: 237.0000 mL | Freq: Two times a day (BID) | ORAL | Status: DC

## 2020-02-22 MED ORDER — MAGNESIUM SULFATE 4 GM/100ML IV SOLN
4.0000 g | Freq: Once | INTRAVENOUS | Status: AC
  Administered 2020-02-22: 4 g via INTRAVENOUS
  Filled 2020-02-22: qty 100

## 2020-02-22 MED ORDER — PANTOPRAZOLE SODIUM 40 MG PO PACK: 40.0000 mg | PACK | Freq: Two times a day (BID) | ORAL | Status: DC

## 2020-02-22 NOTE — Progress Notes (Signed)
Provided the following documents to patient: Phase 2 Bariatric Diet Vitamin Supplementation/Resource Questions answered, contact information for bariatric nurse coordinator provided.    [place] Bariatric Nurse Coordinator [phone]

## 2020-02-22 NOTE — Progress Notes (Signed)
Initial Nutrition Assessment  DOCUMENTATION CODES:   Morbid obesity  INTERVENTION:    Ensure Max po BID, each supplement provides 150 kcal and 30 grams of protein. Change timing to 8am and 2pm to give patient enough time to consume both supplements.   D/C Ensure Enlive po BID.   When d/c home, recommend vitamin supplementation that includes 3000 IU of Vitamin D3, 12 mg of thiamin, 18-60 mg of iron, and 350-500 mcg of Vitamin B12. Bariatric Nurse Coordinator to provide patient with list of recommended vitamins and diet suggestions.    Continue multivitamin while inpatient.   Add [place] yogurt to meal trays.  NUTRITION DIAGNOSIS:   Increased nutrient needs related to wound healing, acute illness (S/P surgery with VAC to abdominal incision) as evidenced by estimated needs.  GOAL:   Patient will meet greater than or equal to 90% of their needs  MONITOR:   PO intake, Supplement acceptance, Labs, Weight trends  REASON FOR ASSESSMENT:   Consult Assessment of nutrition requirement/status  ASSESSMENT:   39 yo female admitted 7/28 with partial SBO d/t pancreatic pseudocyst. S/P ex lap, g-tube placement into gastric remnant, LOA, drainage of intraabdominal abscess, partial resection of roux limb, and wound VAC placement on 8/2. 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   During surgery on 8/2, there was partial resection/reconstruction of the roux limb. Unable to use g-tube for nutrition at this time per Surgery note due to unclear status of BP limb. G-tube was clamped 2 times yesterday with plans to clamp again today.  Spoke with patient on the phone. She has not been taking her vitamin supplements at home. Since admission she has been drinking up to one Ensure Max supplement per day. She thinks she could drink more if she was provided with the supplement earlier in the morning. She is on a bariatric full liquid diet, but not eating/drinking any  of it because she does not like the taste. She is tired of so few options. Her boyfriend brings her soup from [employer] ~8pm for her dinner. She drinks the broth from the chicken noodle soup. Intake is providing ~200 kcal and ~35 gm protein per day.   Spoke with Bariatric Nurse Coordinator has seen the patient and plans to follow-up today to provide information for diet and vitamin supplementation. She thinks patient could tolerate [place] yogurt in addition to her bariatric full liquids.   Received blood transfusion 8/9 for low Hgb 6.9.  PICC placed 8/7.  Labs reviewed. Sodium 134, BUN < 5, corrected Ca 9.34, phos 1.4 (8/9) Vitamin B-12 261 WNL Folate 8.9 WNL  Medications reviewed and include potassium phosphate, MVI (Prosight) daily.  Weight 134.3 kg on 07/20/19 office visit, currently 137.6 kg.  NUTRITION - FOCUSED PHYSICAL EXAM:  unable to complete  Diet Order:   Diet Order            Diet bariatric full liquid Room service appropriate? Yes; Fluid consistency: Thin  Diet effective now                 EDUCATION NEEDS:   Education needs have been addressed  Skin:  Skin Assessment: Skin Integrity Issues: Skin Integrity Issues:: Wound VAC Wound Vac: abdominal incision  Last BM:  8/8  Height:   Ht Readings from Last 1 Encounters:  02/10/20 5\' 10"  (1.778 m)    Weight:   Wt Readings from Last 1 Encounters:  02/22/20 (!) 137.6 kg    BMI:  Body mass  index is 43.53 kg/m.  Estimated Nutritional Needs:   Kcal:  2000-2200  Protein:  115-130 gm  Fluid:  2 L    Lucas Mallow, RD, LDN, CNSC Please refer to Amion for contact information.

## 2020-02-22 NOTE — Progress Notes (Signed)
[facility] Progress Note  8 Days Post-Op  Subjective: CC-  No new complaints. Abdominal pain fairly well controlled. Denies any n/v. Tolerated 2 hour intermittent G tube clamping trials yesterday. Drank Ensure x1 yesterday. Met with Dawn (bariatric coordinator) yesterday which was helpful.  Objective: Vital signs in last 24 hours: Temp:  [97.6 F (36.4 C)-99.4 F (37.4 C)] 98.5 F (36.9 C) (08/10 0839) Pulse Rate:  [82-100] 100 (08/10 0839) Resp:  [17-20] 20 (08/10 0839) BP: (100-109)/(53-77) 103/56 (08/10 0839) SpO2:  [97 %-100 %] 100 % (08/10 0839) Weight:  [137.6 kg] 137.6 kg (08/10 0357) Last BM Date: 02/20/20  Intake/Output from previous day: 08/09 0701 - 08/10 0700 In: 1507.9 [P.O.:630; Blood:367.5; IV Piggyback:510.4] Out: 1360 [Urine:400; Drains:960] Intake/Output this shift: No intake/output data recorded.  PE: Gen: Alert, NAD, pleasant Pulm: rate and effort normal.  Abd: Soft,mild distension,tender over drain. midline wound with vac in place. JP in place withlight brown drainage in bulb. G-tube togravitybilious fluidbag Psych: A&Ox3  Skin: no rashes noted, warm and dry  Lab Results:  Recent Labs    02/21/20 0405 02/21/20 0405 02/21/20 1600 02/22/20 0455  WBC 10.5  --   --  9.9  HGB 6.9*   < > 7.4* 7.6*  HCT 24.0*   < > 24.9* 25.8*  PLT 254  --   --  232   < > = values in this interval not displayed.   BMET Recent Labs    02/21/20 0405 02/22/20 0455  NA 136 134*  K 3.8 3.8  CL 105 103  CO2 23 22  GLUCOSE 94 114*  BUN <5* <5*  CREATININE 0.60 0.60  CALCIUM 7.3* 7.5*   PT/INR No results for input(s): LABPROT, INR in the last 72 hours. CMP     Component Value Date/Time   NA 134 (L) 02/22/2020 0455   K 3.8 02/22/2020 0455   CL 103 02/22/2020 0455   CO2 22 02/22/2020 0455   GLUCOSE 114 (H) 02/22/2020 0455   BUN <5 (L) 02/22/2020 0455   CREATININE 0.60 02/22/2020 0455   CALCIUM 7.5 (L) 02/22/2020 0455   PROT 5.5 (L)  02/22/2020 0455   ALBUMIN 1.7 (L) 02/22/2020 0455   AST 19 02/22/2020 0455   ALT 14 02/22/2020 0455   ALKPHOS 334 (H) 02/22/2020 0455   BILITOT 0.6 02/22/2020 0455   GFRNONAA >60 02/22/2020 0455   GFRAA >60 02/22/2020 0455   Lipase     Component Value Date/Time   LIPASE 202 (H) 02/19/2020 0750       Studies/Results: DG Chest 2 View  Result Date: 02/21/2020 CLINICAL DATA:  Shortness of breath for 1 day.  Smoker. EXAM: CHEST - 2 VIEW COMPARISON:  02/18/2020 FINDINGS: Poor inspiration again demonstrated. Increased subsegmental atelectasis at both lung bases. Stable mild peribronchial thickening. Normal sized heart. Unremarkable bones. IMPRESSION: Poor inspiration with increased bibasilar subsegmental atelectasis and stable mild bronchitic changes. Electronically Signed   By: Claudie Revering M.D.   On: 02/21/2020 20:15    Anti-infectives: Anti-infectives (From admission, onward)   Start     Dose/Rate Route Frequency Ordered Stop   02/14/20 0800  metroNIDAZOLE (FLAGYL) IVPB 500 mg        500 mg 100 mL/hr over 60 Minutes Intravenous Every 8 hours 02/14/20 0131 02/19/20 2359   02/14/20 0230  cefTRIAXone (ROCEPHIN) 2 g in sodium chloride 0.9 % 100 mL IVPB        2 g 200 mL/hr over 30 Minutes Intravenous Daily at  bedtime 02/14/20 0130 02/19/20 2300   02/14/20 0130  metroNIDAZOLE (FLAGYL) IVPB 500 mg        500 mg 100 mL/hr over 60 Minutes Intravenous  Once 02/14/20 0121 02/14/20 0235   02/12/20 0800  vancomycin (VANCOCIN) IVPB 1000 mg/200 mL premix        1,000 mg 200 mL/hr over 60 Minutes Intravenous To Radiology 02/11/20 1619 02/12/20 1105       Assessment/Plan Marginal ulcer -continue carafate and protonix crushed - not taken whole. ABL Anemia - s/p 1u PRBCs 8/9. 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 Left abdominal wall hematoma/abscess with possible perforation. Serosal  tear, Roux limb -S/p exp lap, lysis of adhesions >2hrs, drainage of intra-abdominal hematoma/abscess, insertion of G tube in remnant stomach, partial resection/reconstruction of roux limb 8/2 Dr [name] - POD #8 - CT 8/6 showed no leak and no free air, surgical drain in appropriate location near L retroperitoneal fluid collection - continue JP drain and monitor output >> fluid sent for amylase (58,306) and lipase (pending) -G-tube in remnant stomachto gravity. Cannotuse g tube for feeds since status of BP limb is unclear -Mobilize, PT. Rec HH - WOCN for wound vac changeM/W/F  FEN -IVF, Bariatric fulls ID - Rocephin/Flagyl8/2-8/7. Afebrile. afebrile VTE prophylaxis - scds,lovenox Foley - out Follow-Up - Dr. [name]  Plan: Clamp G tube, if she becomes distended or nauseated then return to gravity. Continue mobilizing. Appreciate bariatric coordinator and dietician assistance. Patient may be ready for discharge from surgical standpoint tomorrow.   LOS: 12 days    [facility] 02/22/2020, 11:09 AM Please see Amion for pager number during day hours 7:00am-4:30pm

## 2020-02-22 NOTE — Progress Notes (Signed)
Family Medicine Teaching Service Daily Progress Note Intern Pager: [phone]  Patient name: Alexis Avila Medical record number: [id number] Date of birth: [birth year] Age: 39 y.o. Gender: female  Primary Care Provider: [name], MD Consultants: GI, Surgery, Wound Care  Code Status: Full  Pt Overview and Major Events to Date:  Admitted 7/28 7/31- CT guided aspiration of fluid from LUQ by IR  8/2- Exploratory laparotomy, lysis of adhesion, G-tube placement in gastric remnant, Roux limb resection, and drainage of LUQ hematoma with drain placed 8/4- Wound vac changed with IV pain medications   Assessment and Plan: Robynn Boltonis a 39 y.o.female with a history of obesity s/p Roux-en-Y gastric bypass, GERD, AUD, and chronic anemia now POD#7s/p ex-lap for lysis of adhesions >2 hrs, drainage of intra-abdominal hematoma/abscess, insertion of G tube in remnant stomach, and partial resection/reconstruction of roux limb. We are also managing her electrolytes due to refeeding syndrome.   POD#8s/p ex-lap  Partial SBO  Sepsis intraabdominal abscess -blood cultures NGTD, completed CTX and flagyl(8/2-8/7) - amylase from JP fluid: 58,306 and lipase pending.  - PT/OT consulted  - G tube in remnant stomach-plan is to clamp G-tube and if patient becomes distended or nauseated, return to gravity  - Per surgery patient may be ready for discharge from surgical standpoint tomorrow.  Metabolic Disarray  C/f refeeding syndrome due to hypophosphatemia and hypokalemia. Potassium stable today at 3.8 after IV repletion yesterday, chloride stable at 103 sodium stable at 134 . Phos 1.6,  and Mg 1.6 - Repleted Phos w/ kphos IV   - mag sulfate IV  - Daily CMP, Phos, Mg - added multivitamin   Acute blood loss anemia, in setting of chronic anemia Hgb 7.6. s/p I unit pRBC yesterday morning and 1U pRBC transfusion on 8/5. Baseline Hb is normocytic and around 10.0. Normal folate and B12 this admission.  -  transfusion threshold 7   - AM CBC - Perform ferritin and as outpatient - restarted lovenox  Pitting edema +2 pitting edema to knee.Added lasix 20mg  IV (P >1, K nl) yesterday and compression stockings and assess for improvement, UOP. -IV furosemide 20 mg  -canconsider TTE to r/o CHFif not improved - daily weights - strict I/O  Atelectasis Afebrile overnight, no cough.Increased consolidation in bilateral lobes on CT A/P consistent with atelectasis.  - 8/9 CXR showed poor inspiration with increased bibasilar subsegmental atelectasis and stable mild bronchitic changes.  Marginal gastric ulcer Improving. Large marginal ulcer on EGD, bx with inflammation but negative for H. Pylori. 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 ALP 1,066 >>> 334. ALT/AST wnl.   History of alcohol use disorder Continue education and counseling. CIWAs discontinued.  GERD Chronic, stable. - on PPI as above  FEN/GI: bariatricfulls, advance per surgery QJF:[id number]  Disposition: Home HealthPT, NO OT necessary  Subjective:   No acute events overnight.  Patient laying in bed and doing ok. She is requesting more chocolate shakes.  She still has some pain from the wound site.  Patient denies any complaints.  Completed FMLA paperwork was given to patient yesterday evening.   Objective: Temp:  [97.6 F (36.4 C)-99.4 F (37.4 C)] 98.9 F (37.2 C) (08/10 0357) Pulse Rate:  [82-101] 99 (08/10 0357) Resp:  [17-20] 18 (08/10 0357) BP: (100-109)/(53-77) 106/56 (08/10 0357) SpO2:  [96 %-100 %] 100 % (08/10 0357) Weight:  [137.6 kg] 137.6 kg (08/10 0357)  Physical Exam: General: well appearing and in no acute distress  Cardiovascular: RRR. No murmurs, rubs or gallops   Respiratory: CTA bilaterally. No wheezes, rales, rhonchi Abdomen: Soft, non distended. Appropriately tender   Extremities: 2+ pitting edema. LE tender to palpation.  JP draining small amount of brown fluid. Gauze around g tube from leakage. Wound vac in place.   Laboratory: Recent Labs  Lab 02/20/20 0327 02/20/20 0327 02/21/20 0405 02/21/20 1600 02/22/20 0455  WBC 13.1*  --  10.5  --  9.9  HGB 7.0*   < > 6.9* 7.4* 7.6*  HCT 23.2*   < > 24.0* 24.9* 25.8*  PLT 274  --  254  --  232   < > = values in this interval not displayed.   Recent Labs  Lab 02/19/20 0058 02/19/20 0750 02/19/20 1526 02/20/20 0327 02/21/20 0405  NA 135   < > 135 134* 136  K 3.3*   < > 3.4* 3.5 3.8  CL 104   < > 104 104 105  CO2 21*   < > 20* 22 23  BUN <5*   < > <5* <5* <5*  CREATININE 0.51   < > 0.55 0.51 0.60  CALCIUM 7.8*   < > 7.5* 7.1* 7.3*  PROT 5.3*  --   --  5.4* 5.5*  BILITOT 0.8  --   --  1.0 0.6  ALKPHOS 378*  --   --  376* 337*  ALT 15  --   --  15 13  AST 15  --   --  17 18  GLUCOSE 114*   < > 120* 117* 94   < > = values in this interval not displayed.    Imaging/Diagnostic Tests:  CXR showed poor inspiration with increased bibasilar subsegmental atelectasis and stable mild bronchitic changes.  Theone Stanley, DO 02/22/2020, 5:33 AM PGY-1, [facility] Intern pager: [phone], text pages welcome

## 2020-02-22 NOTE — Progress Notes (Signed)
Physical Therapy Treatment Patient Details Name: Alexis Avila MRN: [id number] DOB: 12/31/1980 Today's Date: 02/22/2020    History of Present Illness 39 yo female with history of gastric bypass surgery was admitted after having inability to eat, SOB and had UTI.  Went home and returned with dizziness, weakness and gastric pain.  Has partial SBO, low electrolytes, elevated BS, and was seen on 7/29 for enteroscopy, with sepsis symptoms afterward.  Returned to OR for suspected perforation of SB loop, had laparotomy with G-tube on 02/14/20, lysis of adhesions and drainage of IAA done.  Referred to OT.    PT Comments    Pt in recliner trying to shift weight on entry, enthusiastically agreeable to getting out of room for walk. Pt is limited in safe mobility by pain and decreased strength and balance. Focus of session increasing transverse abdominus and gluteal maximus muscles to improve abdomen and spinal support for ambulation to decrease pain at surgical site and low back. Pt able to engage for short distances and reports improved sense of stability. Pt able to ambulate today without outside UE support and able to progress stride length and velocity. D/c plans remain appropriate. PT will continue to follow acutely.      Follow Up Recommendations  Home health PT     Equipment Recommendations  None recommended by PT       Precautions / Restrictions Precautions Precautions: Fall;Other (comment) Precaution Comments: monitor her vitals Restrictions Other Position/Activity Restrictions: limit bending at abdomen    Mobility  Bed Mobility             General bed mobility comments: sitting in recliner on entry   Transfers Overall transfer level: Needs assistance Equipment used: None Transfers: Sit to/from Stand Sit to Stand: Supervision         General transfer comment: Sit to stand from recliner supervision and assist for lines and leads  Ambulation/Gait Ambulation/Gait  assistance: Min guard Gait Distance (Feet): 1500 Feet Assistive device: IV Pole;None Gait Pattern/deviations: Step-through pattern;Wide base of support;Trunk flexed Gait velocity: reduced   General Gait Details: initiates ambulation with IV pole however after approx 40 feet pt with complaints of leaning over increasing stiffness in back. Pt able to ambulate without UE support at decreased velocity but steady gait. vc for TA and glute activation to decreased low back muscle engagement and for relaxation of shoulders, pt stride and velocity increased with distance         Balance Overall balance assessment: Needs assistance Sitting-balance support: Feet supported Sitting balance-Leahy Scale: Good     Standing balance support: No upper extremity supported;During functional activity Standing balance-Leahy Scale: Fair Standing balance comment: able to don face mask in standing without support                            Cognition Arousal/Alertness: Awake/alert Behavior During Therapy: WFL for tasks assessed/performed Overall Cognitive Status: Within Functional Limits for tasks assessed                                           General Comments General comments (skin integrity, edema, etc.): B LE swelling improving, VSS on RA      Pertinent Vitals/Pain Pain Assessment: No/denies pain    Home Living Family/patient expects to be discharged to:: Private residence Living Arrangements: Spouse/significant other  PT Goals (current goals can now be found in the care plan section) Acute Rehab PT Goals Patient Stated Goal: to get home without pain PT Goal Formulation: With patient/family Time For Goal Achievement: 03/01/20 Progress towards PT goals: Progressing toward goals    Frequency    Min 3X/week      PT Plan Current plan remains appropriate    Co-evaluation              AM-PAC PT "6 Clicks" Mobility    Outcome Measure  Help needed turning from your back to your side while in a flat bed without using bedrails?: None Help needed moving from lying on your back to sitting on the side of a flat bed without using bedrails?: A Little Help needed moving to and from a bed to a chair (including a wheelchair)?: None Help needed standing up from a chair using your arms (e.g., wheelchair or bedside chair)?: None Help needed to walk in hospital room?: A Little Help needed climbing 3-5 steps with a railing? : A Lot 6 Click Score: 20    End of Session   Activity Tolerance: Patient tolerated treatment well Patient left: in chair;with call bell/phone within reach Nurse Communication: Mobility status PT Visit Diagnosis: Unsteadiness on feet (R26.81);Other abnormalities of gait and mobility (R26.89)     Time: 1224-8250 PT Time Calculation (min) (ACUTE ONLY): 26 min  Charges:  $Gait Training: 23-37 mins                     Wally Shevchenko B. Migdalia Dk PT, DPT Acute Rehabilitation Services Pager [phone] Office [phone]    [place] 02/22/2020, 5:28 PM

## 2020-02-23 DIAGNOSIS — R600 Localized edema: Secondary | ICD-10-CM

## 2020-02-23 DIAGNOSIS — I878 Other specified disorders of veins: Secondary | ICD-10-CM

## 2020-02-23 LAB — CBC
HCT: 25.8 % — ABNORMAL LOW (ref 36.0–46.0)
Hemoglobin: 7.6 g/dL — ABNORMAL LOW (ref 12.0–15.0)
MCH: 27.2 pg (ref 26.0–34.0)
MCHC: 29.5 g/dL — ABNORMAL LOW (ref 30.0–36.0)
MCV: 92.5 fL (ref 80.0–100.0)
Platelets: 237 10*3/uL (ref 150–400)
RBC: 2.79 MIL/uL — ABNORMAL LOW (ref 3.87–5.11)
RDW: 22.7 % — ABNORMAL HIGH (ref 11.5–15.5)
WBC: 9.5 10*3/uL (ref 4.0–10.5)
nRBC: 0.7 % — ABNORMAL HIGH (ref 0.0–0.2)

## 2020-02-23 LAB — COMPREHENSIVE METABOLIC PANEL
ALT: 13 U/L (ref 0–44)
AST: 19 U/L (ref 15–41)
Albumin: 1.7 g/dL — ABNORMAL LOW (ref 3.5–5.0)
Alkaline Phosphatase: 322 U/L — ABNORMAL HIGH (ref 38–126)
Anion gap: 7 (ref 5–15)
BUN: <5 mg/dL — ABNORMAL LOW (ref 6–20)
CO2: 23 mmol/L (ref 22–32)
Calcium: 7.5 mg/dL — ABNORMAL LOW (ref 8.9–10.3)
Chloride: 103 mmol/L (ref 98–111)
Creatinine, Ser: 0.52 mg/dL (ref 0.44–1.00)
GFR calc Af Amer: >60 mL/min (ref >60)
GFR calc non Af Amer: >60 mL/min (ref >60)
Glucose, Bld: 108 mg/dL — ABNORMAL HIGH (ref 70–99)
Potassium: 3.8 mmol/L (ref 3.5–5.1)
Sodium: 133 mmol/L — ABNORMAL LOW (ref 135–145)
Total Bilirubin: 0.5 mg/dL (ref 0.3–1.2)
Total Protein: 5.5 g/dL — ABNORMAL LOW (ref 6.5–8.1)

## 2020-02-23 LAB — MAGNESIUM: Magnesium: 1.8 mg/dL (ref 1.7–2.4)

## 2020-02-23 LAB — PHOSPHORUS: Phosphorus: 1.6 mg/dL — ABNORMAL LOW (ref 2.5–4.6)

## 2020-02-23 MED ORDER — PANTOPRAZOLE SODIUM 40 MG PO PACK: 40.0000 mg | PACK | Freq: Two times a day (BID) | ORAL | 0 refills | Status: DC

## 2020-02-23 MED ORDER — OXYCODONE HCL 5 MG PO TABS: 5.0000 mg | ORAL_TABLET | Freq: Four times a day (QID) | ORAL | 0 refills | Status: AC | PRN

## 2020-02-23 MED ORDER — MULTI-VITAMIN/MINERALS PO TABS
1.0000 | ORAL_TABLET | Freq: Every day | ORAL | 2 refills | Status: AC
Start: 2020-02-23 — End: 2021-02-22

## 2020-02-23 MED ORDER — POTASSIUM PHOSPHATE MONOBASIC 500 MG PO TABS: 1000.0000 mg | ORAL_TABLET | Freq: Three times a day (TID) | ORAL | 0 refills | Status: DC

## 2020-02-23 MED ORDER — SUCRALFATE 1 GM/10ML PO SUSP: 1.0000 g | Freq: Three times a day (TID) | ORAL | 0 refills | Status: DC

## 2020-02-23 MED ORDER — ONDANSETRON 4 MG PO TBDP: 4.0000 mg | ORAL_TABLET | Freq: Three times a day (TID) | ORAL | 0 refills | Status: AC | PRN

## 2020-02-23 MED ORDER — ACETAMINOPHEN 160 MG/5ML PO SOLN: 1000.0000 mg | Freq: Three times a day (TID) | ORAL | 0 refills | Status: DC

## 2020-02-23 MED ORDER — MAGNESIUM GLUCONATE 30 MG PO TABS: 30.0000 mg | ORAL_TABLET | Freq: Two times a day (BID) | ORAL | 0 refills | Status: DC

## 2020-02-23 NOTE — Progress Notes (Signed)
[facility] Progress Note  9 Days Post-Op  Subjective: CC-  Feeling well this morning. No new complaints. Denies n/v. She drank 1.5 Ensure yesterday. G tube did not remain clamped yesterday, states that it was clamped intermittently then returned to gravity last night for unknown reason. States that she never felt distended or nauseated. BM x2 yesterday.  Objective: Vital signs in last 24 hours: Temp:  [98.2 F (36.8 C)-99.3 F (37.4 C)] 98.2 F (36.8 C) (08/11 0811) Pulse Rate:  [86-101] 101 (08/11 0528) Resp:  [18-20] 20 (08/11 0811) BP: (95-114)/(56-67) 95/56 (08/11 0811) SpO2:  [95 %-100 %] 96 % (08/11 0811) Weight:  [136.1 kg] 136.1 kg (08/11 0528) Last BM Date: 02/22/20  Intake/Output from previous day: 08/10 0701 - 08/11 0700 In: 360 [P.O.:360] Out: 1825 [Urine:1650; Drains:175] Intake/Output this shift: No intake/output data recorded.  PE: Gen: Alert, NAD, pleasant Pulm: rate and effort normal.  Abd: Soft,mild distension,tender over drain.midline woundwith vac in place. JP in place withlight brown drainage in bulb. G-tube togravitybiliousfluidbag Psych: A&Ox3  Skin: no rashes noted, warm and dry  Lab Results:  Recent Labs    02/22/20 0455 02/23/20 0532  WBC 9.9 9.5  HGB 7.6* 7.6*  HCT 25.8* 25.8*  PLT 232 237   BMET Recent Labs    02/22/20 0455 02/23/20 0532  NA 134* 133*  K 3.8 3.8  CL 103 103  CO2 22 23  GLUCOSE 114* 108*  BUN <5* <5*  CREATININE 0.60 0.52  CALCIUM 7.5* 7.5*   PT/INR No results for input(s): LABPROT, INR in the last 72 hours. CMP     Component Value Date/Time   NA 133 (L) 02/23/2020 0532   K 3.8 02/23/2020 0532   CL 103 02/23/2020 0532   CO2 23 02/23/2020 0532   GLUCOSE 108 (H) 02/23/2020 0532   BUN <5 (L) 02/23/2020 0532   CREATININE 0.52 02/23/2020 0532   CALCIUM 7.5 (L) 02/23/2020 0532   PROT 5.5 (L) 02/23/2020 0532   ALBUMIN 1.7 (L) 02/23/2020 0532   AST 19 02/23/2020 0532   ALT 13  02/23/2020 0532   ALKPHOS 322 (H) 02/23/2020 0532   BILITOT 0.5 02/23/2020 0532   GFRNONAA >60 02/23/2020 0532   GFRAA >60 02/23/2020 0532   Lipase     Component Value Date/Time   LIPASE 202 (H) 02/19/2020 0750       Studies/Results: DG Chest 2 View  Result Date: 02/21/2020 CLINICAL DATA:  Shortness of breath for 1 day.  Smoker. EXAM: CHEST - 2 VIEW COMPARISON:  02/18/2020 FINDINGS: Poor inspiration again demonstrated. Increased subsegmental atelectasis at both lung bases. Stable mild peribronchial thickening. Normal sized heart. Unremarkable bones. IMPRESSION: Poor inspiration with increased bibasilar subsegmental atelectasis and stable mild bronchitic changes. Electronically Signed   By: Claudie Revering M.D.   On: 02/21/2020 20:15    Anti-infectives: Anti-infectives (From admission, onward)   Start     Dose/Rate Route Frequency Ordered Stop   02/14/20 0800  metroNIDAZOLE (FLAGYL) IVPB 500 mg        500 mg 100 mL/hr over 60 Minutes Intravenous Every 8 hours 02/14/20 0131 02/19/20 2359   02/14/20 0230  cefTRIAXone (ROCEPHIN) 2 g in sodium chloride 0.9 % 100 mL IVPB        2 g 200 mL/hr over 30 Minutes Intravenous Daily at bedtime 02/14/20 0130 02/19/20 2300   02/14/20 0130  metroNIDAZOLE (FLAGYL) IVPB 500 mg        500 mg 100 mL/hr over 60 Minutes  Intravenous  Once 02/14/20 0121 02/14/20 0235   02/12/20 0800  vancomycin (VANCOCIN) IVPB 1000 mg/200 mL premix        1,000 mg 200 mL/hr over 60 Minutes Intravenous To Radiology 02/11/20 1619 02/12/20 1105       Assessment/Plan Marginal ulcer -continuecarafate andprotonix crushed - not taken whole. ABL Anemia - s/p 1u PRBCs 8/9. 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 Left abdominal wall hematoma/abscess with possible perforation. Serosal tear, Roux limb -S/p exp lap, lysis of adhesions >2hrs, drainage of intra-abdominal  hematoma/abscess, insertion of G tube in remnant stomach, partial resection/reconstruction of roux limb 8/2 Dr [name] - POD #9 - CT 8/6 showed no leak and no free air, surgical drain in appropriate location near L retroperitoneal fluid collection -continue JP drain and monitor output >> fluid sent for amylase (58,306) and lipase (pending) -G-tube in remnant stomachto gravity. Cannotuse g tube for feeds since status of BP limb is unclear -Mobilize, PT. Rec HH - WOCN for wound vac changeM/W/F  FEN -IVF, Bariatric fulls ID - Rocephin/Flagyl8/2-8/7. Afebrile VTE prophylaxis - scds,lovenox Foley - out Follow-Up - Dr. [name]  Plan: Keep G tube clamped unless patient becomes distended or nauseated. continue JP drain and abdominal wound vac. Patient seen by bariatric coordinator again yesterday and discussed diet. [place] for discharge today from surgical standpoint. I am working on getting her follow up in our office in 1-2 weeks.   LOS: 13 days    [facility] 02/23/2020, 9:21 AM Please see Amion for pager number during day hours 7:00am-4:30pm

## 2020-02-23 NOTE — TOC Transition Note (Signed)
Transition of Care [facility]) - CM/SW Discharge Note   Patient Details  Name: Maryanna Stuber MRN: [id number] Date of Birth: 09-Nov-1980  [facility]) CM/SW Contact:  Bethena Roys, RN Phone Number: 02/23/2020, 2:46 PM   Clinical Narrative:  Plan for patient to transistion home with home health services. Patient has a Armed forces training and education officer and we discussed several agencies that may accept the insurance. Case Manager reached out to [facility] and they are unable to accept the patient. Case Manager then called [facility] to see if they can service the patient. [facility] was abel to accept the referral and start of care to begin within 24-48 hours post transition home. Patient will have a bedside commode delivered to the room via [employer]. Wound VAC at the bedside and the staff RN will apply before the patient \\transitions  home.   Final next level of care: [place] Barriers to Discharge: No Barriers Identified   Patient Goals and CMS Choice Patient states their goals for this hospitalization and ongoing recovery are:: to return home.   Choice offered to / list presented to : NA  Discharge Plan and Services In-house Referral: NA Discharge Planning Services: CM Consult Post Acute Care Choice: Home Health          DME Arranged: 3-N-1 DME Agency: [employer] Date DME Agency Contacted: 02/23/20 Time DME Agency Contacted: 1400 Representative spoke with at [place]: [place]: [place] Date [place]: 02/23/20 Time [place]: 1300 Representative spoke with at [place]: Tommi Rumps  Readmission Risk Interventions No flowsheet data found.

## 2020-02-23 NOTE — Care Management (Signed)
1719 02-23-20 Patient needs heavy duty commode due to body habitus. New order placed in Epic for new HD 3n1. [employer] aware to call the patient on 02-24-20 when the DME arrives. Bethena Roys, RN,BSN Case Manager

## 2020-02-23 NOTE — Consult Note (Signed)
[place] Nurse wound follow up Patient receiving care in [place]. Wound type: abdominal surgical site Measurement: na Wound bed: 100% pink granulation tissue. Drainage (amount, consistency, odor) serosanginous in cannister Periwound: intact Dressing procedure/placement/frequency: 2 pieces of black foam removed, one piece of black foam placed. Immediate seal obtained. Patient tolerated very well. Val Riles, RN, MSN, CWOCN, CNS-BC, pager [phone]

## 2020-02-23 NOTE — Progress Notes (Signed)
Family Medicine Teaching Service Daily Progress Note Intern Pager: [phone]  Patient name: Alexis Avila Medical record number: [id number] Date of birth: October 02, 1980 Age: 39 y.o. Gender: female  Primary Care Provider: [name], MD Consultants: GI, surgery, wound care, nutrition Code Status: Full  Pt Overview and Major Events to Date:  Admitted 7/28 7/31- CT guided aspiration of fluid from LUQ by IR  8/2- Exploratory laparotomy, lysis of adhesion, G-tube placement in gastric remnant, Roux limb resection, and drainage of LUQ hematoma with drain placed 8/4- Wound vac changed with IV pain medications   Assessment and Plan: Alexis Boltonis a 39 y.o.female with a history of obesity s/p Roux-en-Y gastric bypass, GERD, AUD, and chronic anemia now POD#9s/p ex-lap for lysis of adhesions >2 hrs, drainage of intra-abdominal hematoma/abscess, insertion of G tube in remnant stomach, and partial resection/reconstruction of roux limb. We are also managing her electrolytes due to refeeding syndrome.   POD#9s/p ex-lap  Partial SBO  Sepsis intraabdominal abscess -blood cultures NGTD, completed CTX and flagyl(8/2-8/7) -amylasefrom JP fluid:58,306, lipase 202 - PT/OTconsulted - G tube in remnant stomach-plan to keep g tube clamped unless she becomes distended or nauseous  - Per surgery patient ready for discharge from surgical standpoint today. F/u with surgery in 1-2 weeks.   Metabolic Disarray  C/f refeeding syndrome due to hypophosphatemia and hypokalemia. Potassium stable today at 3.8, chloride stable at 103sodiumstable at 133. Phos 1.6,  and Mg 1.8 - RepletePhos w/ kphos IV again today - Daily CMP, Phos, Mg - daily multivitamin   Acute blood loss anemia, in setting of chronic anemia Hgbstable at 7.6.s/pI unit pRBC 8/9 and1U pRBC transfusion on 8/5. Baseline Hb is normocytic and around 10.0. Normal folate and B12 this admission.  - transfusion threshold 7  - AM  CBC - Perform ferritin and as outpatient - restartedlovenox  Pitting edema 2+ pitting edema to knee. Improved from previous days.  - daily weights - strict I/O  Atelectasis Pt has been afebrile, no cough.Increased consolidation in bilateral lobes on CT A/P consistent with atelectasis.  - 8/9 CXR showed poor inspiration with increased bibasilar subsegmental atelectasis and stable mild bronchitic changes.  Marginal gastric ulcer Improving. Large marginal ulcer on EGD, bx with inflammation but negative for H. Pylori. 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 ALP 1,066 >>> 322. ALT/AST wnl.   History of alcohol use disorder Continue education and counseling. CIWAs discontinued.  GERD Chronic, stable. - on PPI as above   FEN/GI: bariatricfulls, advance per surgery IFO:[id number]  Disposition: D/C later this afternoon. Home HealthPT, NO OT necessary   Subjective:   No acute events overnight.  Patient states she is doing okay and that she is ready to go home.  She has questions about showering and questions regarding clamping her G-tube on her own. Surgery assisted her after I met with her.    Objective: Temp:  [98.3 F (36.8 C)-99.3 F (37.4 C)] 99.2 F (37.3 C) (08/11 0528) Pulse Rate:  [86-101] 101 (08/11 0528) Resp:  [18-20] 18 (08/11 0528) BP: (99-114)/(56-67) 114/57 (08/11 0528) SpO2:  [95 %-100 %] 99 % (08/11 0528) Weight:  [136.1 kg] 136.1 kg (08/11 0528) Physical Exam: General: In no acute distress  Cardiovascular: RRR, no murmurs, rubs or gallops  Respiratory: CTA bilaterally. No wheezes, rales, or rhonchi Abdomen: soft, non distended. Appropriately tender  Extremities: 2+ pitting edema. LE tender to palpation  JP draining small amount of brown fluid. Gauze around g tube from leakage. Wound vac in place. Wound is draining brown fluid.    Laboratory: Recent Labs  Lab 02/21/20 0405 02/21/20 0405 02/21/20 1600 02/22/20 0455 02/23/20 0532  WBC 10.5  --   --  9.9 9.5  HGB 6.9*   < > 7.4* 7.6* 7.6*  HCT 24.0*   < > 24.9* 25.8* 25.8*  PLT 254  --   --  232 237   < > = values in this interval not displayed.   Recent Labs  Lab 02/21/20 0405 02/22/20 0455 02/23/20 0532  NA 136 134* 133*  K 3.8 3.8 3.8  CL 105 103 103  CO2 '23 22 23  ' BUN <5* <5* <5*  CREATININE 0.60 0.60 0.52  CALCIUM 7.3* 7.5* 7.5*  PROT 5.5* 5.5* 5.5*  BILITOT 0.6 0.6 0.5  ALKPHOS 337* 334* 322*  ALT '13 14 13  ' AST '18 19 19  ' GLUCOSE 94 114* 108*    Imaging/Diagnostic Tests:  No new images   Alexis Stanley, DO 02/23/2020, 7:59 AM PGY-1 , Page Intern pager: [phone], text pages welcome

## 2020-02-23 NOTE — Discharge Instructions (Addendum)
You came in with epigastric abdominal pain and found to have partial small bowel obstruction, and had an exploratory laparotomy for lysis of adhesions, drainage of intra-abdominal hematoma/abscess,  insertion of G tube in remnant stomach, and partial resection/reconstruction of roux limb. We were also managing your electrolytes and monitoring your Hb from your anemia. We are glad you are recovering well.  Please do not take any NSAIDS (ibuprofen, Aleeve, Advil)  Please be sure to return to the hospital if you experience uncontrollable pain, nausea, vomiting, fevers, or dizziness.   You will have follow up lab work completed next week to monitor your electrolytes with your Home Health provider.   Negative Pressure Wound Therapy Home Guide Negative pressure wound therapy (NPWT) uses a sponge or foam-like material (dressing) placed on or inside the wound. The wound is then covered and sealed with a cover dressing that sticks to your skin (is adhesive). This keeps air out. A tube is attached to the cover dressing, and this tube connects to a small pump. The pump sucks fluid and germs from the wound. NPWT helps to increase blood flow to the wound and heal it from the inside. What are the risks? NPWT is usually safe to use. However, problems can occur, including:  Skin irritation from the dressing adhesive.  Bleeding.  Infection.  Dehydration. Wounds with large amounts of drainage can cause excessive fluid loss.  Pain. Supplies needed:  A disposable garbage bag.  Soap and water, or hand sanitizer.  Wound cleanser or salt-water solution (saline).  New sponge and cover dressing.  Protective clothing.  Gauze pad.  Vinyl gloves.  Tape.  Skin protectant. This may be a wipe, film, or spray.  Clean or germ-free (sterile) scissors.  Eye protection. How to change your dressing Prepare to change your dressing  1. If told by your health care provider, take pain medicine 30 minutes  before changing the dressing. 2. Wash your hands with soap and water. Dry your hands with a clean towel. If soap and water are not available, use hand sanitizer. 3. Set up a clean station for wound care. 4. Open the dressing package so that the sponge dressing remains on the inside of the package. 5. Wear gloves, protective clothing, and eye protection. Remove old dressing  1. Turn off the pump and disconnect the tubing from the dressing. 2. Carefully remove the adhesive cover dressing in the direction of your hair growth. 3. Remove the sponge dressing that is inside the wound. If the sponge sticks, use a wound cleanser or saline solution to wet the sponge and help it come off more easily. 4. Throw the old sponge and cover dressing supplies into the garbage bag. 5. Remove your gloves by grabbing the cuff and turning the glove inside out. Place the gloves in the trash immediately. 6. Wash your hands with soap and water. Dry your hands with a clean towel. If soap and water are not available, use hand sanitizer. Clean your wound  Wear gloves, protective clothing, and eye protection. Follow your health care provider's instructions on how to clean your wound. You may be told to: 1. Clean the wound using a saline solution or a wound cleanser and a clean gauze pad. 2. Pat the wound dry with a gauze pad. Do not rub the wound. 3. Throw the gauze pad into the garbage bag. 4. Remove your gloves by grabbing the cuff and turning the glove inside out. Place the gloves in the trash immediately. 5. Wash your  hands with soap and water. Dry your hands with a clean towel. If soap and water are not available, use hand sanitizer. Apply new dressing  Wear gloves, protective clothing, and eye protection. 1. If told by your health care provider, apply a skin protectant to any skin that will be exposed to adhesive. Let the skin protectant dry. 2. Cut a piece of new sponge dressing and put it on or in the  wound. 3. Using clean scissors, cut a nickel-sized hole in the new cover dressing. 4. Apply the cover dressing. 5. Attach the suction tube over the hole in the cover dressing. 6. Take off your gloves. Put them in the plastic bag with the old dressing. Tie the bag shut and throw it away. 7. Wash your hands with soap and water. Dry your hands with a clean towel. If soap and water are not available, use hand sanitizer. 8. Turn the pump back on. The sponge dressing should collapse. Do not change the settings on the machine without talking to a health care provider. 9. Replace the container in the pump that collects fluid if it is full. Replace the container per the manufacturer's instructions or at least once a week, even if it is not full. General tips and recommendations If the alarm sounds:  Stay calm.  Do not turn off the pump or do anything with the dressing.  Reasons the alarm may go off: ? The battery is low. Change the battery or plug the device into electrical power. ? The dressing has a leak. Find the leak and put tape over the leak. ? The fluid collection container is full. Change the fluid container.  Call your health care provider right away if you cannot fix the problem.  Explain to your health care provider what is happening. Follow his or her instructions. General instructions  Do not turn off the pump unless told to do so by your health care provider.  Do not turn off the pump for more than 2 hours. If the pump is off for more than 2 hours, the dressing will need to be changed.  If your health care provider says it is okay to shower: ? Do not take the pump into the shower. ? Make sure the wound dressing is protected and sealed. The wound dressing must stay dry.  Check frequently that the machine indicates that therapy is on and that all clamps are open.  Do not use over-the-counter medicated or antiseptic creams, sprays, liquids, or dressings unless your health care  provider approves. Contact a health care provider if:  You have new pain.  You develop irritation, a rash, or itching around the wound or dressing.  You see new black or yellow tissue in your wound.  The dressing changes are painful or cause bleeding.  The pump has been off for more than 2 hours, and you do not know how to change the dressing.  The pump alarm goes off, and you do not know what to do. Get help right away if:  You have a lot of bleeding.  The wound breaks open.  You have severe pain.  You have signs of infection, such as: ? More redness, swelling, or pain. ? More fluid or blood. ? Warmth. ? Pus or a bad smell. ? Red streaks leading from the wound. ? A fever.  You see a sudden change in the color or texture of the drainage.  You have signs of dehydration, such as: ? Little or no  tears, urine, or sweat. ? Muscle cramps. ? Very dry mouth. ? Headache. ? Dizziness. Summary  Negative pressure wound therapy (NPWT) is a device that helps your wound heal.  Set up a clean station for wound care. Your health care provider will tell you what supplies to use.  Follow your health care provider's instructions on how to clean your wound and how to change the dressing.  Contact a health care provider if you have new pain, an irritation, or a rash, or if the alarm goes off and you do not know what to do.  Get help right away if you have a lot of bleeding, your wound breaks open, or you have severe pain. Also, get help if you have signs of infection. This information is not intended to replace advice given to you by your health care provider. Make sure you discuss any questions you have with your health care provider. Document Revised: 10/23/2018 Document Reviewed: 09/18/2018 Elsevier Patient Education  [place] Surgical drains are used to remove extra fluid that normally builds up in a surgical wound after surgery. A surgical  drain helps to heal a surgical wound. Different kinds of surgical drains include:  Active drains. These drains use suction to pull drainage away from the surgical wound. Drainage flows through a tube to a container outside of the body. With these drains, you need to keep the bulb or the drainage container flat (compressed) at all times, except while you empty it. Flattening the bulb or container creates suction.  Passive drains. These drains allow fluid to drain naturally, by gravity. Drainage flows through a tube to a bandage (dressing) or a container outside of the body. Passive drains do not need to be emptied. A drain is placed during surgery. Right after surgery, drainage is usually bright red and a little thicker than water. The drainage may gradually turn yellow or pink and become thinner. It is likely that your health care provider will remove the drain when the drainage stops or when the amount decreases to 1-2 Tbsp (15-30 mL) during a 24-hour period. Supplies needed:  Tape.  Germ-free cleaning solution (sterile saline).  Cotton swabs.  Split gauze drain sponge: 4 x 4 inches (10 x 10 cm).  Gauze square: 4 x 4 inches (10 x 10 cm). How to care for your surgical drain Care for your drain as told by your health care provider. This is important to help prevent infection. If your drain is placed at your back, or any other hard-to-reach area, ask another person to assist you in performing the following tasks: General care  Keep the skin around the drain dry and covered with a dressing at all times.  Check your drain area every day for signs of infection. Check for: ? Redness, swelling, or pain. ? Pus or a bad smell. ? Cloudy drainage. ? Tenderness or pressure at the drain exit site. Changing the dressing Follow instructions from your health care provider about how to change your dressing. Change your dressing at least once a day. Change it more often if needed to keep the dressing dry.  Make sure you: 1. Gather your supplies. 2. Wash your hands with soap and water before you change your dressing. If soap and water are not available, use hand sanitizer. 3. Remove the old dressing. Avoid using scissors to do that. 4. Wash your hands with soap and water again after removing the old dressing. 5. Use sterile saline  to clean your skin around the drain. You may need to use a cotton swab to clean the skin. 6. Place the tube through the slit in a drain sponge. Place the drain sponge so that it covers your wound. 7. Place the gauze square or another drain sponge on top of the drain sponge that is on the wound. Make sure the tube is between those layers. 8. Tape the dressing to your skin. 9. Tape the drainage tube to your skin 1-2 inches (2.5-5 cm) below the place where the tube enters your body. Taping keeps the tube from pulling on any stitches (sutures) that you have. 10. Wash your hands with soap and water. 11. Write down the color of your drainage and how often you change your dressing. How to empty your active drain  1. Make sure that you have a measuring cup that you can empty your drainage into. 2. Wash your hands with soap and water. If soap and water are not available, use hand sanitizer. 3. Loosen any pins or clips that hold the tube in place. 4. If your health care provider tells you to strip the tube to prevent clots and tube blockages: ? Hold the tube at the skin with one hand. Use your other hand to pinch the tubing with your thumb and first finger. ? Gently move your fingers down the tube while squeezing very lightly. This clears any drainage, clots, or tissue from the tube. ? You may need to do this several times each day to keep the tube clear. Do not pull on the tube. 5. Open the bulb cap or the drain plug. Do not touch the inside of the cap or the bottom of the plug. 6. Turn the device upside down and gently squeeze. 7. Empty all of the drainage into the measuring  cup. 8. Compress the bulb or the container and replace the cap or the plug. To compress the bulb or the container, squeeze it firmly in the middle while you close the cap or plug the container. 9. Write down the amount of drainage that you have in each 24-hour period. If you have less than 2 Tbsp (30 mL) of drainage during 24 hours, contact your health care provider. 10. Flush the drainage down the toilet. 11. Wash your hands with soap and water. Contact a health care provider if:  You have redness, swelling, or pain around your drain area.  You have pus or a bad smell coming from your drain area.  You have a fever or chills.  The skin around your drain is warm to the touch.  The amount of drainage that you have is increasing instead of decreasing.  You have drainage that is cloudy.  There is a sudden stop or a sudden decrease in the amount of drainage that you have.  Your drain tube falls out.  Your active drain does not stay compressed after you empty it. Summary  Surgical drains are used to remove extra fluid that normally builds up in a surgical wound after surgery.  Different kinds of surgical drains include active drains and passive drains. Active drains use suction to pull drainage away from the surgical wound, and passive drains allow fluid to drain naturally.  It is important to care for your drain to prevent infection. If your drain is placed at your back, or any other hard-to-reach area, ask another person to assist you.  Contact your health care provider if you have redness, swelling, or pain around your  drain area. This information is not intended to replace advice given to you by your health care provider. Make sure you discuss any questions you have with your health care provider. Document Revised: 08/05/2018 Document Reviewed: 08/05/2018 Elsevier Patient Education  2020 [employer].

## 2020-02-24 ENCOUNTER — Telehealth: Payer: Self-pay | Admitting: Family Medicine

## 2020-02-24 DIAGNOSIS — R531 Weakness: Secondary | ICD-10-CM | POA: Diagnosis not present

## 2020-02-24 LAB — LIPASE, FLUID: Lipase-Fluid: >60600 U/L

## 2020-02-24 NOTE — Discharge Summary (Addendum)
[facility] Discharge Summary  Patient name: Alexis Avila Medical record number: [id number] Date of birth: 1981-03-02 Age: 39 y.o. 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 No wheezes, rales, or rhonchi Abdomen: soft, non distended. Appropriately tender  Extremities: 2+ pitting edema. LE tender to palpation JP draining small amount of brown fluid. Gauze around g tube from leakage. Wound vac in place.Wound is draining brown fluid.  Brief Hospital Course:  Alexis Avila is a 39 y.o. 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 PMH is significant for GERD and pancreatitis.   Sepsis, likely source bowel perforation Patient became tachycardic with a fever and concern for bowel perforation.  Due to being in post operative period, bariatric surgery was consulted by general surgery. The patient was subsequently started on a 6-day course of metronidazole and ceftriaxone.  Bariatric surgery performed exploratory laparotomy with drainage of intra-abdominal hematoma/abscess, insertion of G-tube and  remnant stomach, partial resection/reconstruction of Roux limb. Labs and vitals signs improved with source control.   Gastric ulcers GI was consulted and performed an EGD due to unexplained epigastric abdominal pain, nausea & vomiting.  Patient was determined to have LA grade B reflux esophagitis with no bleeding.  Patient was S/P Roux-en-Y gastrojejunostomy with gastrojejunal anastomosis.  Anastomoses characterized by 2 marginal ulcers both with clean bases.  Biopsies were taken and were negative for H pylori.  Per GI,  patient was treated with Protonix 40 p.o. twice daily for at least 8 weeks and sucralfate 1 g p.o. 4 times daily.   Small bowel obstruction, complicated by bowel perforation and drainage of pseudocysts At admission, AST 29, ALT 55, ALP 1066. Troponin levels were negative, Lipase 113 and GGT 116. Patient's pain in the ED was managed with morphine, dilaudid and zofran for nausea.  CT on admission revealed numerous pseudocysts in the tail of the pancreas with the largest measuring up to 10 cm.  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  Pain continued and patient required Norco. IR was consulted for drainage of pseudocyst resulting in drainage of 250 cc bilious appearing aspirate. This sample was sent for amylase, lipase, and bilirubin levels and for culture analysis. Sample results revealed bowel contents so bariatric surgery was consulted and performed an exploratory laparotomy with placement of a JP drain. G tube remained in place and healing via wound vac was placed and wound allowed to heal with secondary intention. G tube was used for contrasted study. CT Abdomen noted no free air. Patient received PICC as her G tube could not be used for TPN if neccessary.    Pitting Edema  Patient placed on mIVF became hypervolemic and mIVF was discontinued as patient was transitioned successfully to oral intake. Lasix was added.  Metabolic  disarray Phos, Mg and K were monitored and replaced in order to prevent refeeding syndrome.   Acute blood loss Patient Hgb trended down after Ex-lap and patient received total 2 units of pRBC during hospitalization.   Elevated alkaline phosphatase Liver panels were trended throughout hospitalization. AST 29>118>106. ALT 55>71>80. ALP 1066>920>813>>>337.   Shortness of breath CXR 7/28 was significant for mild left basilar atelectasis. The left hemodiaphragm was elevated. The patient sounded clear bilaterally on exam throughout hospitalization and was satting >90% on room air. Post ex-lap patient reported some increasing SOB. CXR 8/3 found low lung volumes  w/ bibasilar atelectasis and possible fissural fluid pseudotumor. On the left side. Repeat 8/6 CXR noted left basilar opacity that was likely atelectasis/ consolidation w/ pleural fluid. Repeat 8/9 was negative for pneumonia.  Bil DVT US confirmed no DVTs.   Borderline normal EKG Admission EKG showed NSR, borderline conduction delays, appears unchanged from previous.  AM EKG was sinus tachy with QTc 446. 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  Continued patient on Zofran.  GERD Patient had history of GERD but no home medications.  Noted to have mild to moderate reflux esophagitis during EGD.  Placed on Protonix 40 twice daily for 8 weeks and Carafate 4 times daily.   Issues for Follow Up:  1. Recommend patient have CBC, CMP, mag and phosphorus checked with home health. Patient received replacement magnesium and phosphorous throughout admission for low levels of these electrolytes.   2. Please confirm patient has PCP follow up 3. FMLA paperwork completed. An extra copy was provided in case patient needs this.  4. Please confirm that the patient is taking tylenol and oxycodone as needed for pain, zofran as needed for nausea or vomiting, protonix, carafate, Magonate, multivitamin, and potassium phosphate as  directed.   Significant Procedures:   7/29: Small bowel enteroscopy  7/29: small bowel endoscopy  7/31: CT guided aspiration 8/2: Exploratory Laparotomy, insertion of gastrostomy tube, adhesion lysis, drainage of intraabdominal abscess, application of wound vac, partial resection of roux limb   Significant Labs and Imaging:  Recent Labs  Lab 02/21/20 0405 02/21/20 0405 02/21/20 1600 02/22/20 0455 02/23/20 0532  WBC 10.5  --   --  9.9 9.5  HGB 6.9*   < > 7.4* 7.6* 7.6*  HCT 24.0*   < > 24.9* 25.8* 25.8*  PLT 254  --   --  232 237   < > = values in this interval not displayed.   Recent Labs  Lab 02/19/20 0058 02/19/20 0750 02/19/20 1526 02/19/20 1526 02/20/20 0327 02/20/20 0327 02/20/20 1022 02/21/20 0405 02/21/20 0405 02/22/20 0455 02/23/20 0532  NA 135   < > 135  --  134*  --   --  136  --  134* 133*  K 3.3*   < > 3.4*   < > 3.5   < >  --  3.8   < > 3.8 3.8  CL 104   < > 104  --  104  --   --  105  --  103 103  CO2 21*   < > 20*  --  22  --   --  23  --  22 23  GLUCOSE 114*   < > 120*  --  117*  --   --  94  --  114* 108*  BUN <5*   < > <5*  --  <5*  --   --  <5*  --  <5* <5*  CREATININE 0.51   < > 0.55  --  0.51  --   --  0.60  --  0.60 0.52  CALCIUM 7.8*   < > 7.5*  --  7.1*  --   --  7.3*  --  7.5* 7.5*  MG 1.7  --   --   --   --   --  1.6* 1.8  --  1.6* 1.8  PHOS <1.0*   < > 1.5*  --  1.4*  --   --  1.4*  --  1.6* 1.6*  ALKPHOS 378*  --   --   --  376*  --   --  337*  --  334* 322*  AST 15  --   --   --  17  --   --  18  --  19 19  ALT 15  --   --   --  15  --   --  13  --  14 13  ALBUMIN 1.6*   < > 1.7*  --  1.7*  --   --  1.6*  --  1.7* 1.7*   < > = values in this interval not displayed.    Results/Tests Pending at Time of Discharge:   Body Fluid Lipase level  Discharge Medications:  Allergies as of 02/23/2020      Reactions   Nsaids Other (See Comments)   H/o gastric bypass, h/o ulcers   Penicillins Hives   Tolerated ceftriaxone 02/2020       Medication List    STOP taking these medications   nitrofurantoin 100 MG capsule Commonly known as: MACRODANTIN   promethazine 12.5 MG tablet Commonly known as: PHENERGAN     TAKE these medications   acetaminophen 160 MG/5ML solution Commonly known as: TYLENOL Take 31.3 mLs (1,000 mg total) by mouth every 8 (eight) hours. Notes to patient: For pain. Do not take more than 4000 mg a day.    magnesium gluconate 30 MG tablet Commonly known as: MAGONATE Take 1 tablet (30 mg total) by mouth 2 (two) times daily. Notes to patient: For supplementation.   multivitamin with minerals tablet Take 1 tablet by mouth daily. Notes to patient: For supplementation   ondansetron 4 MG disintegrating tablet Commonly known as: Zofran ODT Take 1 tablet (4 mg total) by mouth every 8 (eight) hours as needed for nausea or vomiting. Notes to patient: For nausea and vomiting. Can take with or without food.   oxyCODONE 5 MG immediate release tablet Commonly known as: Oxy IR/ROXICODONE Take 1-2 tablets (5-10 mg total) by mouth every 6 (six) hours as needed for up to 7 days for moderate pain (5mg  for moderate pain). Notes to patient: For pain. May cause drowsiness, dizziness, and constipation. Can use miralax, or senna to assist with constipation.    pantoprazole sodium 40 mg/20 mL Pack Commonly known as: PROTONIX Take 20 mLs (40 mg total) by mouth 2 (two) times daily. Notes to patient: For reflux.   potassium phosphate (monobasic) 500 MG tablet Commonly known as: K-PHOS ORIGINAL Take 2 tablets (1,000 mg total) by mouth 3 (three) times daily with meals. Notes to patient: For supplementation   sucralfate 1 GM/10ML suspension Commonly known as: CARAFATE Take 10 mLs (1 g total) by mouth 4 (four) times daily -  with meals and at  bedtime. Notes to patient: For your stomach.            Discharge Care Instructions  (From admission, onward)         Start     Ordered   02/23/20 0000  Discharge  wound care:       Comments: Negative pressure wound therapy   02/23/20 1433          Discharge Instructions: Please refer to Patient Instructions section of EMR for full details.  Patient was counseled important signs and symptoms that should prompt return to medical care, changes in medications, dietary instructions, activity restrictions, and follow up appointments.   Follow-Up Appointments:  Follow-up Information    Greer Pickerel, MD. Call.   Specialty: General Surgery Why: We are working on your appointment, call to confirm Please arrive 30 minutes prior to your appointment to check in and fill out paperwork. Bring photo ID and insurance information. Contact information: [place] [address] [phone]        [facility] Follow up.   Specialty: Home Health Services Why: Registered Nurse- office to call and set up visit times.  Contact information: [address] [place] [address] [phone]        [facility] Follow up.   Why: Bedside commode: to be delivered to the room prior to transition home.  Contact information: [address] [place] [address] [place], [place], DO 02/24/2020, 5:07 PM PGY-1, [place]

## 2020-02-24 NOTE — Telephone Encounter (Signed)
Patient provided FMLA forms to me during hospital stay. Nursing--please attach medication list and fast to listed number. Please make a copy for our records.  Form in side wall bin.   [name], MD  Family Medicine Teaching Service

## 2020-02-25 ENCOUNTER — Emergency Department (HOSPITAL_COMMUNITY): Payer: BC Managed Care – PPO

## 2020-02-25 ENCOUNTER — Encounter (HOSPITAL_COMMUNITY): Payer: Self-pay

## 2020-02-25 ENCOUNTER — Inpatient Hospital Stay (HOSPITAL_COMMUNITY)
Admission: EM | Admit: 2020-02-25 | Discharge: 2020-02-29 | DRG: 872 | Disposition: A | Discharge status: Home Health | Payer: BC Managed Care – PPO | Attending: Family Medicine | Admitting: Family Medicine

## 2020-02-25 ENCOUNTER — Other Ambulatory Visit: Payer: Self-pay

## 2020-02-25 DIAGNOSIS — Z880 Allergy status to penicillin: Secondary | ICD-10-CM

## 2020-02-25 DIAGNOSIS — J90 Pleural effusion, not elsewhere classified: Secondary | ICD-10-CM | POA: Diagnosis not present

## 2020-02-25 DIAGNOSIS — Z79899 Other long term (current) drug therapy: Secondary | ICD-10-CM | POA: Diagnosis not present

## 2020-02-25 DIAGNOSIS — E871 Hypo-osmolality and hyponatremia: Secondary | ICD-10-CM | POA: Diagnosis not present

## 2020-02-25 DIAGNOSIS — K219 Gastro-esophageal reflux disease without esophagitis: Secondary | ICD-10-CM | POA: Diagnosis not present

## 2020-02-25 DIAGNOSIS — K859 Acute pancreatitis without necrosis or infection, unspecified: Secondary | ICD-10-CM | POA: Diagnosis not present

## 2020-02-25 DIAGNOSIS — K863 Pseudocyst of pancreas: Secondary | ICD-10-CM | POA: Diagnosis present

## 2020-02-25 DIAGNOSIS — R509 Fever, unspecified: Secondary | ICD-10-CM | POA: Diagnosis not present

## 2020-02-25 DIAGNOSIS — J189 Pneumonia, unspecified organism: Secondary | ICD-10-CM | POA: Diagnosis not present

## 2020-02-25 DIAGNOSIS — Z20822 Contact with and (suspected) exposure to covid-19: Secondary | ICD-10-CM | POA: Diagnosis present

## 2020-02-25 DIAGNOSIS — Z825 Family history of asthma and other chronic lower respiratory diseases: Secondary | ICD-10-CM

## 2020-02-25 DIAGNOSIS — D62 Acute posthemorrhagic anemia: Secondary | ICD-10-CM | POA: Diagnosis not present

## 2020-02-25 DIAGNOSIS — R161 Splenomegaly, not elsewhere classified: Secondary | ICD-10-CM | POA: Diagnosis not present

## 2020-02-25 DIAGNOSIS — E876 Hypokalemia: Secondary | ICD-10-CM | POA: Diagnosis not present

## 2020-02-25 DIAGNOSIS — L02211 Cutaneous abscess of abdominal wall: Secondary | ICD-10-CM | POA: Diagnosis not present

## 2020-02-25 DIAGNOSIS — Z6841 Body Mass Index (BMI) 40.0 and over, adult: Secondary | ICD-10-CM | POA: Diagnosis not present

## 2020-02-25 DIAGNOSIS — R1084 Generalized abdominal pain: Secondary | ICD-10-CM | POA: Diagnosis not present

## 2020-02-25 DIAGNOSIS — J9811 Atelectasis: Secondary | ICD-10-CM | POA: Diagnosis not present

## 2020-02-25 DIAGNOSIS — R5082 Postprocedural fever: Secondary | ICD-10-CM | POA: Diagnosis not present

## 2020-02-25 DIAGNOSIS — K6389 Other specified diseases of intestine: Secondary | ICD-10-CM | POA: Diagnosis not present

## 2020-02-25 DIAGNOSIS — Z9049 Acquired absence of other specified parts of digestive tract: Secondary | ICD-10-CM | POA: Diagnosis not present

## 2020-02-25 DIAGNOSIS — A419 Sepsis, unspecified organism: Secondary | ICD-10-CM | POA: Diagnosis not present

## 2020-02-25 DIAGNOSIS — K651 Peritoneal abscess: Secondary | ICD-10-CM

## 2020-02-25 DIAGNOSIS — Z9884 Bariatric surgery status: Secondary | ICD-10-CM

## 2020-02-25 DIAGNOSIS — E739 Lactose intolerance, unspecified: Secondary | ICD-10-CM | POA: Diagnosis not present

## 2020-02-25 DIAGNOSIS — K289 Gastrojejunal ulcer, unspecified as acute or chronic, without hemorrhage or perforation: Secondary | ICD-10-CM | POA: Diagnosis present

## 2020-02-25 DIAGNOSIS — R000 Tachycardia, unspecified: Secondary | ICD-10-CM | POA: Diagnosis not present

## 2020-02-25 DIAGNOSIS — Z8249 Family history of ischemic heart disease and other diseases of the circulatory system: Secondary | ICD-10-CM

## 2020-02-25 DIAGNOSIS — E8770 Fluid overload, unspecified: Secondary | ICD-10-CM | POA: Diagnosis present

## 2020-02-25 DIAGNOSIS — D519 Vitamin B12 deficiency anemia, unspecified: Secondary | ICD-10-CM | POA: Diagnosis present

## 2020-02-25 DIAGNOSIS — E878 Other disorders of electrolyte and fluid balance, not elsewhere classified: Secondary | ICD-10-CM | POA: Diagnosis present

## 2020-02-25 DIAGNOSIS — K8689 Other specified diseases of pancreas: Secondary | ICD-10-CM | POA: Diagnosis not present

## 2020-02-25 DIAGNOSIS — F17210 Nicotine dependence, cigarettes, uncomplicated: Secondary | ICD-10-CM | POA: Diagnosis not present

## 2020-02-25 DIAGNOSIS — D259 Leiomyoma of uterus, unspecified: Secondary | ICD-10-CM | POA: Diagnosis not present

## 2020-02-25 DIAGNOSIS — R791 Abnormal coagulation profile: Secondary | ICD-10-CM | POA: Diagnosis present

## 2020-02-25 DIAGNOSIS — R600 Localized edema: Secondary | ICD-10-CM | POA: Diagnosis present

## 2020-02-25 LAB — COMPREHENSIVE METABOLIC PANEL
ALT: 12 U/L (ref 0–44)
AST: 17 U/L (ref 15–41)
Albumin: 1.8 g/dL — ABNORMAL LOW (ref 3.5–5.0)
Alkaline Phosphatase: 284 U/L — ABNORMAL HIGH (ref 38–126)
Anion gap: 8 (ref 5–15)
BUN: 6 mg/dL (ref 6–20)
CO2: 21 mmol/L — ABNORMAL LOW (ref 22–32)
Calcium: 8.2 mg/dL — ABNORMAL LOW (ref 8.9–10.3)
Chloride: 103 mmol/L (ref 98–111)
Creatinine, Ser: 0.6 mg/dL (ref 0.44–1.00)
GFR calc Af Amer: >60 mL/min (ref >60)
GFR calc non Af Amer: >60 mL/min (ref >60)
Glucose, Bld: 119 mg/dL — ABNORMAL HIGH (ref 70–99)
Potassium: 3.6 mmol/L (ref 3.5–5.1)
Sodium: 132 mmol/L — ABNORMAL LOW (ref 135–145)
Total Bilirubin: 0.9 mg/dL (ref 0.3–1.2)
Total Protein: 6.2 g/dL — ABNORMAL LOW (ref 6.5–8.1)

## 2020-02-25 LAB — CBC WITH DIFFERENTIAL/PLATELET
Abs Immature Granulocytes: 0.14 10*3/uL — ABNORMAL HIGH (ref 0.00–0.07)
Basophils Absolute: 0 10*3/uL (ref 0.0–0.1)
Basophils Relative: 0 %
Eosinophils Absolute: 0 10*3/uL (ref 0.0–0.5)
Eosinophils Relative: 0 %
HCT: 26.4 % — ABNORMAL LOW (ref 36.0–46.0)
Hemoglobin: 7.6 g/dL — ABNORMAL LOW (ref 12.0–15.0)
Immature Granulocytes: 1 %
Lymphocytes Relative: 17 %
Lymphs Abs: 2.5 10*3/uL (ref 0.7–4.0)
MCH: 27 pg (ref 26.0–34.0)
MCHC: 28.8 g/dL — ABNORMAL LOW (ref 30.0–36.0)
MCV: 93.6 fL (ref 80.0–100.0)
Monocytes Absolute: 0.9 10*3/uL (ref 0.1–1.0)
Monocytes Relative: 6 %
Neutro Abs: 11.2 10*3/uL — ABNORMAL HIGH (ref 1.7–7.7)
Neutrophils Relative %: 76 %
Platelets: 274 10*3/uL (ref 150–400)
RBC: 2.82 MIL/uL — ABNORMAL LOW (ref 3.87–5.11)
RDW: 23 % — ABNORMAL HIGH (ref 11.5–15.5)
WBC: 14.9 10*3/uL — ABNORMAL HIGH (ref 4.0–10.5)
nRBC: 0.6 % — ABNORMAL HIGH (ref 0.0–0.2)

## 2020-02-25 LAB — PROTIME-INR
INR: 1.4 — ABNORMAL HIGH (ref 0.8–1.2)
Prothrombin Time: 16.8 seconds — ABNORMAL HIGH (ref 11.4–15.2)

## 2020-02-25 LAB — URINALYSIS, ROUTINE W REFLEX MICROSCOPIC
Bilirubin Urine: NEGATIVE
Glucose, UA: NEGATIVE mg/dL
Hgb urine dipstick: NEGATIVE
Ketones, ur: 20 mg/dL — AB
Leukocytes,Ua: NEGATIVE
Nitrite: NEGATIVE
Protein, ur: NEGATIVE mg/dL
Specific Gravity, Urine: 1.017 (ref 1.005–1.030)
pH: 6 (ref 5.0–8.0)

## 2020-02-25 LAB — I-STAT BETA HCG BLOOD, ED (MC, WL, AP ONLY): I-stat hCG, quantitative: <5 m[IU]/mL (ref <5)

## 2020-02-25 LAB — LACTIC ACID, PLASMA
Lactic Acid, Venous: 0.9 mmol/L (ref 0.5–1.9)
Lactic Acid, Venous: 1.1 mmol/L (ref 0.5–1.9)

## 2020-02-25 LAB — SARS CORONAVIRUS 2 BY RT PCR (HOSPITAL ORDER, PERFORMED IN ~~LOC~~ HOSPITAL LAB): SARS Coronavirus 2: NEGATIVE

## 2020-02-25 MED ORDER — SODIUM CHLORIDE 0.9 % IV SOLN
2.0000 g | Freq: Once | INTRAVENOUS | Status: AC
  Administered 2020-02-25: 2 g via INTRAVENOUS
  Filled 2020-02-25: qty 20

## 2020-02-25 MED ORDER — METRONIDAZOLE IN NACL 5-0.79 MG/ML-% IV SOLN
500.0000 mg | Freq: Once | INTRAVENOUS | Status: AC
  Administered 2020-02-25: 500 mg via INTRAVENOUS
  Filled 2020-02-25: qty 100

## 2020-02-25 MED ORDER — SODIUM CHLORIDE 0.9 % IV BOLUS
1000.0000 mL | Freq: Once | INTRAVENOUS | Status: AC
  Administered 2020-02-25: 1000 mL via INTRAVENOUS

## 2020-02-25 MED ORDER — SODIUM CHLORIDE 0.9 % IV SOLN: 1.0000 g | Freq: Once | INTRAVENOUS | Status: DC

## 2020-02-25 MED ORDER — IOHEXOL 300 MG/ML  SOLN
100.0000 mL | Freq: Once | INTRAMUSCULAR | Status: AC | PRN
  Administered 2020-02-25: 100 mL via INTRAVENOUS

## 2020-02-25 MED ORDER — PIPERACILLIN-TAZOBACTAM IN DEX 2-0.25 GM/50ML IV SOLN: 2.2500 g | Freq: Once | INTRAVENOUS | Status: DC

## 2020-02-25 NOTE — H&P (Addendum)
[facility] Admission History and Physical Service Pager: [phone]  Patient name: Alexis Avila Medical record number: [id number] Date of birth: Oct 18, 1980 Age: 39 y.o. Gender: female  Primary Care Provider: [name], MD Consultants: Surgery, Wound Care Code Status: FULL Preferred Emergency Contact: Jess Barters [phone]  Chief Complaint: Fever and acute onset atypical leftside sharp pain  Assessment and Plan: Alexis Avila is a 39 y.o. female presenting with fever. PMH is significant for GERD, pancreatitis, open retrocolic Roux-en-Y gastric bypass in 2002, s/p ex lap for possible bowel perforation 02/2020.    Sepsis Patient meets sepsis criteria with  tachycardia, increased respiratory rate, and WBC of 14.9 Since presentation to ED.  Patient had objective temperature prior to ED presentation of 101 taken by wound care nurse.  Patient has a likely source she is recovering from ex lap and has wound VAC in place. CBC at presentation has elevated WBC 14.9 of note patient was discharged 2 days ago and WBC was 9.5 (14.9 today).  Hgb 7.6 at presentation stable with discharge 2 days ago. Na 132 K+ 3.6 at pesentation.  Lactic acid within normal limits.  UA was only significant for ketones in urine, of note previous UAs did not have ketones.  Patient takes OxyIR for pain post ex lap.  Patient received enough for 7 days outpatient for moderate pain; however she is only been discharged for 2 days.  PT elevated at 16.8 INR 1.4.  Since presentation to ED patient has also started having left-sided pain that starts in the upper left quadrant of the back and radiates  around towards G-tube.  Patient reported that the pain was sudden and sharp enough to "wake me up".  Pain cannot be reproduced by palpating the site.  Wound at the site of wound VAC appears stable however per patient when wound care nurse removed sponge she was concerned for possible infection at the site.   Patient also has blood on bandages surrounding JP drain, patient reported recently changing that herself.  There is a small amount of red-brown fluid draining from JP.  G-tube remains clamped and appears clean with some minor brown crusting around insertion.  CT Abdomen Pelvis at presentation fluid collection now measuring 4 cm max but no new or increasing fluid collections are visualized.  Also noted residual inflammatory processes within the left upper quadrant and mid abdominal region as well as multiple irregular rim-enhancing fluid collections adjacent to the pancreatic tail and posterior to the left kidney without significant change and thought to represent pseudocysts.  Consulted surgery spoke with Dr. Georgette Dover, who recommended continuing gram-negative coverage overnight and placing patient on n.p.o. with ice chips.  Surgery will see in the a.m. -Admit to [facility], Dr. Andria Frames, MedSurg - f/u Bcx x2 -consult surgery, appreciate recommendations - npo -Continue IV Ceftriaxone -Continue OxyIR 5-10 q6 PRN and tylenol PRN - F/u AM CBC -f/u AM CMP - f/u Mg2+ - f/u Phos - vitals per routine -Zofran ODT. -MIVF 75 mL/h NS while n.p.o. -PT/OT eval and treat  Tachypnea Patient has RR 16-24.  Patient does not endorse SOB or chest pain however she does endorse left side pain that starts at what appears to be right beneath her left lower lobe and radiates around to her G-tube.  CXR noted small left pleural effusion with left lower lobe atelectasis or pneumonia.  Atelectasis is more likely as CXR from 02/21/2020 noted poor inspiration with increased bibasilar segmental atelectasis. -Continue IV ceftriaxone - cont monitoring pulse ox  Hyponatremia Mild, asymptomatic. There is concern for refeeding syndrome this patient has been on bariatric full diet and appears to be taking in soup and specialized milkshakes. Patient also appears hypervolemic with significant pitting edema in bilateral legs.  Patient takes Zofran  as needed for nausea PRN. Home medications include Magnesium, potassium and multivitamin. -f/u electrolytes: am BMP,phosphorus, Mg2+ -Monitor I's/O's -Continue home medications - mIVF NS 8ml/h -f/u AM CMP  Marginal ulcer Patient home medications include Carafate and Protonix. -Continue home medications   Normocytic anemiahistory of B12 deficiency anemias/p gastric bypass Hgb at admission 7.6 this is stable with Hgb at discharge 02/24/2020.   -AM CBC  FEN/GI: NPO ice chips Prophylaxis: Lovenox  Disposition: MedSurg  History of Present Illness:  Alexis Avila is a 39 y.o. female presenting with fever and acute onset atypical pain of the left side. Patient presents 2 days after discharge s/p ex lap investigating possible small bowel perforation post IR attempt to drain pancreatic pseudocyst.  Patient reports that she was doing well until the day of presentation to ED, 02/25/2020.  Patient G-tube remains clamped.  Patient reports that she recently changed her JP tube dressing herself.  Wound care came to change patient's wound VAC for the first time since discharge on day of presentation the ED, 8/13.  When she took her temperature prior to changing out VAC sponge patient had temp of 101.5.  Per patient wound VAC nurse also told her that her wound looked infected and she also noted that drainage into the wound VAC did not appear to be appropriate color.  Patient presented to ED while waiting in the ED she began to have sharp pain in the left side that radiated around to her G-tube.  Patient reports that the pain is sudden and she has not noticed anything that makes it worse or better.  Since discharge she has been adhering to her bariatric diet and has only had tubes or her "[employer] chocolate shakes."  She has not had any sick contacts that she knows of and since leaving the hospital she is only been to the furniture store and home.  She reported at least one nonbloody, BM since  discharge.   Review Of Systems: Per HPI with the following additions:   Review of Systems  Constitutional: Positive for fever. Negative for appetite change and fatigue.  Respiratory: Negative for cough and shortness of breath.   Cardiovascular: Positive for leg swelling. Negative for chest pain.  Gastrointestinal: Positive for abdominal pain. Negative for blood in stool, constipation, nausea and vomiting.  Genitourinary: Negative for difficulty urinating.     Patient Active Problem List   Diagnosis Date Noted  . Poor venous access   . Hypophosphatemia   . SOB (shortness of breath)   . Lower extremity edema   . Dyspnea   . Status post exploratory laparotomy   . Abdominal pain   . Elevated LFTs   . Marginal ulcers   . History of Roux-en-Y gastric bypass   . Intractable nausea and vomiting 02/09/2020  . Hypokalemia   . Pseudocyst of pancreas   . SBO (small bowel obstruction) ([place])   . History of pancreatitis 07/20/2019  . Status post gastric bypass for obesity 07/20/2019  . Anemia, B12 deficiency 07/20/2019  . Current smoker 07/20/2019  . Chronic back pain 07/20/2019  . Ovarian cyst 07/20/2019  . GERD (gastroesophageal reflux disease) 07/20/2019  . Morbid obesity ([place]) 07/20/2019    Past Medical History: Past Medical History:  Diagnosis Date  .  Allergy   . Anemia     Past Surgical History: Past Surgical History:  Procedure Laterality Date  . APPLICATION OF WOUND VAC N/A 02/14/2020   Procedure: APPLICATION OF WOUND VAC;  Surgeon: Greer Pickerel, MD;  Location: [facility];  Service: General;  Laterality: N/A;  . BIOPSY  02/10/2020   Procedure: BIOPSY;  Surgeon: Lavena Bullion, DO;  Location: [facility];  Service: Gastroenterology;;  . [facility]  2013   crystalized gallbaldder  . DEBRIDEMENT OF ABDOMINAL WALL ABSCESS N/A 02/14/2020   Procedure: DRAINAGE OF INTRAABDOMINAL ABSCESS;  Surgeon: Greer Pickerel, MD;  Location: [facility];  Service: General;  Laterality: N/A;  .  ENTEROSCOPY N/A 02/10/2020   Procedure: ENTEROSCOPY;  Surgeon: Lavena Bullion, DO;  Location: [facility];  Service: Gastroenterology;  Laterality: N/A;  . GASTRIC BYPASS  2002  . GASTROSTOMY N/A 02/14/2020   Procedure: INSERTION OF GASTROSTOMY TUBE;  Surgeon: Greer Pickerel, MD;  Location: [facility];  Service: General;  Laterality: N/A;  . LAPAROSCOPIC [place] WITH UPPER ENDOSCOPY N/A 02/14/2020   Procedure: PARTIAL RESECTION OF ROUX LIMB;  Surgeon: Greer Pickerel, MD;  Location: [facility];  Service: General;  Laterality: N/A;  . LAPAROTOMY N/A 02/14/2020   Procedure: EXPLORATORY LAPAROTOMY;  Surgeon: Greer Pickerel, MD;  Location: [place];  Service: General;  Laterality: N/A;  . LYSIS OF ADHESION N/A 02/14/2020   Procedure: LYSIS OF ADHESION >2 HOURS;  Surgeon: Greer Pickerel, MD;  Location: [facility];  Service: General;  Laterality: N/A;    Social History: Social History   Tobacco Use  . Smoking status: Current Every Day Smoker    Packs/day: 0.50    Years: 5.00    Pack years: 2.50    Types: Cigarettes  . Smokeless tobacco: Never Used  Vaping Use  . Vaping Use: Never used  Substance Use Topics  . Alcohol use: Yes    Alcohol/week: 13.0 standard drinks    Types: 5 Glasses of wine, 4 Cans of beer, 4 Shots of liquor per week  . Drug use: Not on file   Please also refer to relevant sections of EMR.  Family History: Family History  Problem Relation Age of Onset  . COPD Sister   . Heart disease Sister      Allergies and Medications: Allergies  Allergen Reactions  . Nsaids Other (See Comments)    H/o gastric bypass, h/o ulcers  . Penicillins Hives    Tolerated ceftriaxone 02/2020   No current facility-administered medications on file prior to encounter.   Current Outpatient Medications on File Prior to Encounter  Medication Sig Dispense Refill  . magnesium gluconate (MAGONATE) 30 MG tablet Take 1 tablet (30 mg total) by mouth 2 (two) times daily. 60 tablet 0  . Multiple  Vitamins-Minerals (MULTIVITAMIN WITH MINERALS) tablet Take 1 tablet by mouth daily. 30 tablet 2  . oxyCODONE (OXY IR/ROXICODONE) 5 MG immediate release tablet Take 1-2 tablets (5-10 mg total) by mouth every 6 (six) hours as needed for up to 7 days for moderate pain (5mg  for moderate pain). 30 tablet 0  . potassium phosphate, monobasic, (K-PHOS ORIGINAL) 500 MG tablet Take 2 tablets (1,000 mg total) by mouth 3 (three) times daily with meals. 90 tablet 0  . acetaminophen (TYLENOL) 160 MG/5ML solution Take 31.3 mLs (1,000 mg total) by mouth every 8 (eight) hours. 120 mL 0  . ondansetron (ZOFRAN ODT) 4 MG disintegrating tablet Take 1 tablet (4 mg total) by mouth every 8 (eight) hours as needed  for nausea or vomiting. 20 tablet 0  . pantoprazole sodium (PROTONIX) 40 mg/20 mL PACK Take 20 mLs (40 mg total) by mouth 2 (two) times daily. 1200 mL 0  . sucralfate (CARAFATE) 1 GM/10ML suspension Take 10 mLs (1 g total) by mouth 4 (four) times daily -  with meals and at bedtime. 1200 mL 0    Objective: BP (!) 112/49   Pulse (!) 105   Temp 98.5 F (36.9 C) (Oral)   Resp (!) 24   Ht 5\' 10"  (1.778 m)   Wt 95.3 kg   LMP 02/03/2020   SpO2 98%   BMI 30.13 kg/m  Exam: General: Patient resting in bed, NAD Eyes: EOM intact ENTM: MMM Neck: No cervical lymphadenopathy palpated Cardiovascular: Regular rhythm, tachycardic rate, no murmurs detected Respiratory: Clear and equal bilaterally, good chest expansion, no extra work of breathing noted Gastrointestinal: Wound VAC in place wound did not appear to be infected at the site of wound VAC, G-tube clamped with nonsuspicious brown crusting around insertion site, JP tube draining with minimal red-brown drainage, JB tube had some blood on bandages and then pulled back bandages also had red-brown serous substance.  MSK: Palpated area the patient reported pain was nonreproducible.  Auscultated area and heard bowel sounds Extremities: 3+ pitting edema noted to the  knees bilaterally, unable to palpate distal pulses due to significant edema Neuro: No focal neuro deficits noted Psych: appropriate mood and affect  Labs and Imaging: CBC BMET  Recent Labs  Lab 02/25/20 1440  WBC 14.9*  HGB 7.6*  HCT 26.4*  PLT 274   Recent Labs  Lab 02/25/20 1440  NA 132*  K 3.6  CL 103  CO2 21*  BUN 6  CREATININE 0.60  GLUCOSE 119*  CALCIUM 8.2*     EKG: My own interpretation (not copied from electronic read) 02/25/2020: Sinus tachy  DG Chest 2 View  Result Date: 02/25/2020 CLINICAL DATA:  Sepsis EXAM: CHEST - 2 VIEW COMPARISON:  02/21/2020 FINDINGS: Left pleural effusion with left lower lobe and atelectasis or infiltrate. Minimal right base density, likely atelectasis. Heart is normal size. No effusions. IMPRESSION: Small left pleural effusion with left lower lobe atelectasis or pneumonia. Electronically Signed   By: Rolm Baptise M.D.   On: 02/25/2020 18:08   CT ABDOMEN PELVIS W CONTRAST  Result Date: 02/25/2020 CLINICAL DATA:  Abdomen distension EXAM: CT ABDOMEN AND PELVIS WITH CONTRAST TECHNIQUE: Multidetector CT imaging of the abdomen and pelvis was performed using the standard protocol following bolus administration of intravenous contrast. CONTRAST:  162mL OMNIPAQUE IOHEXOL 300 MG/ML  SOLN COMPARISON:  CT 02/18/2020, 02/09/2020 FINDINGS: Lower chest: Lung bases demonstrate small left pleural effusion and left basilar consolidation which may be due to atelectasis or pneumonia. This does not appear significantly changed since 02/18/2020. Stable cardiac size. Hepatobiliary: No focal hepatic abnormality. Status post cholecystectomy. No biliary dilatation. Pancreas: No ductal dilatation. Similar residual inflammatory changes in the left upper quadrant with multiple mildly rim enhancing fluid collections adjacent to the pancreatic tail and posterior to the left kidney presumably representing chronic pseudocysts. Spleen: Enlarged, measuring up to 16 cm in size.  Adrenals/Urinary Tract: Adrenal glands are within normal limits. Kidneys show no hydronephrosis. The bladder is normal Stomach/Bowel: Status post gastric bypass. Crescent shaped gas and fluid collection in the left upper quadrant adjacent to the spleen appears contiguous with the bypass stomach. Gastrostomy tube and balloon present within the bypassed stomach. Left upper quadrant rim enhancing gas and fluid collection further  decreased in size, now measuring 4 x 2.8 cm, previously 6.8 x 4.2 cm. Mild fluid distension of left lower abdominal small bowel near the anastomosis. Remainder of the small bowel is nondistended. The appendix is normal. There is mild wall thickening of the splenic flexure. Vascular/Lymphatic: Nonaneurysmal aorta.  No suspicious nodes Reproductive: Fibroid uterus.  No suspicious adnexal mass. Other: No pelvic effusion. No free air. Increased bilateral flank edema. Generalized mesenteric inflammatory process in the left upper quadrant and mid abdomen without significant change. Surgical drain with tip terminating in the left upper quadrant adjacent to the gas and fluid collection. Musculoskeletal: No acute osseous abnormality. Stable scattered lucent lesions within the pelvic bones and vertebra. IMPRESSION: 1. Prior gastric bypass and placement of percutaneous gastrostomy tube within the bypassed stomach. Interval decrease in size of the previously noted left upper quadrant mesenteric gas and fluid collection, now measuring 4 cm maximum. No new or increasing fluid collections are visualized. 2. Residual inflammatory process within the left upper quadrant and mid abdominal region. Multiple irregular rim enhancing fluid collections adjacent to the pancreatic tail and posterior to the left kidney without significant change and thought to represent pseudocysts. 3. Fibroid uterus 4. Small left pleural effusion with partial consolidation in the left lower lobe, atelectasis versus pneumonia. 5. Enlarged  spleen Electronically Signed   By: Donavan Foil M.D.   On: 02/25/2020 22:31    Freida Busman, MD 02/25/2020, 11:02 PM PGY-1, [place] Intern pager: [phone], text pages welcome  Resident Addendum I have separately seen and examined the patient.  I have discussed the findings and exam with the resident and agree with the above note.  I helped develop the management plan that is described in the residents note and I agree with the content.  Changes have been made in BLUE.    Addison Naegeli, MD PGY-2 [facility] residency program

## 2020-02-25 NOTE — ED Triage Notes (Signed)
Pt arrives POV for eval of poss sepsis from intraabd abscess. Pt reports home nurse saw her this AM, noted fever of 101.5 Recently dc'd for septic abd, tachy in triage, temp 100.

## 2020-02-25 NOTE — ED Provider Notes (Signed)
[facility] EMERGENCY DEPARTMENT Provider Note   CSN: [id number] Arrival date & time: 02/25/20  1405     History Chief Complaint  Patient presents with  . Fever    Alexis Avila is a 39 y.o. female.  The history is provided by the patient.  Fever Max temp prior to arrival:  101 Severity:  Mild Onset quality:  Sudden Duration:  1 day Timing:  Constant Progression:  Unchanged Chronicity:  New Relieved by:  Nothing Worsened by:  Nothing Ineffective treatments:  None tried Associated symptoms: no chest pain, no chills, no cough, no diarrhea, no dysuria, no ear pain, no rash, no sore throat and no vomiting   Risk factors: recent sickness        Past Medical History:  Diagnosis Date  . Allergy   . Anemia     Patient Active Problem List   Diagnosis Date Noted  . Sepsis ([place]) 02/26/2020  . Hyponatremia   . Abscess of abdominal cavity ([place])   . Poor venous access   . Hypophosphatemia   . SOB (shortness of breath)   . Lower extremity edema   . Dyspnea   . Status post exploratory laparotomy   . Abdominal pain   . Elevated LFTs   . Marginal ulcers   . History of Roux-en-Y gastric bypass   . Intractable nausea and vomiting 02/09/2020  . Hypokalemia   . Pseudocyst of pancreas   . SBO (small bowel obstruction) ([place])   . History of pancreatitis 07/20/2019  . Status post gastric bypass for obesity 07/20/2019  . Anemia, B12 deficiency 07/20/2019  . Current smoker 07/20/2019  . Chronic back pain 07/20/2019  . Ovarian cyst 07/20/2019  . GERD (gastroesophageal reflux disease) 07/20/2019  . Morbid obesity ([place]) 07/20/2019    Past Surgical History:  Procedure Laterality Date  . APPLICATION OF WOUND VAC N/A 02/14/2020   Procedure: APPLICATION OF WOUND VAC;  Surgeon: Greer Pickerel, MD;  Location: [facility];  Service: General;  Laterality: N/A;  . BIOPSY  02/10/2020   Procedure: BIOPSY;  Surgeon: Lavena Bullion, DO;  Location: [facility];  Service:  Gastroenterology;;  . Lorin [facility]  2013   crystalized gallbaldder  . DEBRIDEMENT OF ABDOMINAL WALL ABSCESS N/A 02/14/2020   Procedure: DRAINAGE OF INTRAABDOMINAL ABSCESS;  Surgeon: Greer Pickerel, MD;  Location: [facility];  Service: General;  Laterality: N/A;  . ENTEROSCOPY N/A 02/10/2020   Procedure: ENTEROSCOPY;  Surgeon: Lavena Bullion, DO;  Location: [facility];  Service: Gastroenterology;  Laterality: N/A;  . GASTRIC BYPASS  2002  . GASTROSTOMY N/A 02/14/2020   Procedure: INSERTION OF GASTROSTOMY TUBE;  Surgeon: Greer Pickerel, MD;  Location: [facility];  Service: General;  Laterality: N/A;  . LAPAROSCOPIC [place] WITH UPPER ENDOSCOPY N/A 02/14/2020   Procedure: PARTIAL RESECTION OF ROUX LIMB;  Surgeon: Greer Pickerel, MD;  Location: [facility];  Service: General;  Laterality: N/A;  . LAPAROTOMY N/A 02/14/2020   Procedure: EXPLORATORY LAPAROTOMY;  Surgeon: Greer Pickerel, MD;  Location: [facility];  Service: General;  Laterality: N/A;  . LYSIS OF ADHESION N/A 02/14/2020   Procedure: LYSIS OF ADHESION >2 HOURS;  Surgeon: Greer Pickerel, MD;  Location: [facility];  Service: General;  Laterality: N/A;     OB History   No obstetric history on file.     Family History  Problem Relation Age of Onset  . COPD Sister   . Heart disease Sister     Social History   Tobacco Use  .  Smoking status: Current Every Day Smoker    Packs/day: 0.50    Years: 5.00    Pack years: 2.50    Types: Cigarettes  . Smokeless tobacco: Never Used  Vaping Use  . Vaping Use: Never used  Substance Use Topics  . Alcohol use: Yes    Alcohol/week: 13.0 standard drinks    Types: 5 Glasses of wine, 4 Cans of beer, 4 Shots of liquor per week  . Drug use: Not on file    Home Medications Prior to Admission medications   Medication Sig Start Date End Date Taking? Authorizing Provider  magnesium gluconate (MAGONATE) 30 MG tablet Take 1 tablet (30 mg total) by mouth 2 (two) times daily. 02/23/20  Yes Simmons-Robinson, Makiera, MD    Multiple Vitamins-Minerals (MULTIVITAMIN WITH MINERALS) tablet Take 1 tablet by mouth daily. 02/23/20 02/22/21 Yes Simmons-Robinson, Makiera, MD  oxyCODONE (OXY IR/ROXICODONE) 5 MG immediate release tablet Take 1-2 tablets (5-10 mg total) by mouth every 6 (six) hours as needed for up to 7 days for moderate pain (5mg  for moderate pain). 02/23/20 03/01/20 Yes Simmons-Robinson, Makiera, MD  potassium phosphate, monobasic, (K-PHOS ORIGINAL) 500 MG tablet Take 2 tablets (1,000 mg total) by mouth 3 (three) times daily with meals. 02/23/20  Yes Simmons-Robinson, Makiera, MD  acetaminophen (TYLENOL) 160 MG/5ML solution Take 31.3 mLs (1,000 mg total) by mouth every 8 (eight) hours. 02/23/20   Simmons-Robinson, Riki Sheer, MD  ondansetron (ZOFRAN ODT) 4 MG disintegrating tablet Take 1 tablet (4 mg total) by mouth every 8 (eight) hours as needed for nausea or vomiting. 02/23/20 03/24/20  Simmons-Robinson, Riki Sheer, MD  pantoprazole sodium (PROTONIX) 40 mg/20 mL PACK Take 20 mLs (40 mg total) by mouth 2 (two) times daily. 02/23/20 03/24/20  Simmons-Robinson, Riki Sheer, MD  sucralfate (CARAFATE) 1 GM/10ML suspension Take 10 mLs (1 g total) by mouth 4 (four) times daily -  with meals and at bedtime. 02/23/20   Simmons-Robinson, Riki Sheer, MD    Allergies    Nsaids and Penicillins  Review of Systems   Review of Systems  Constitutional: Positive for fever. Negative for chills.  HENT: Negative for ear pain and sore throat.   Eyes: Negative for pain and visual disturbance.  Respiratory: Negative for cough and shortness of breath.   Cardiovascular: Negative for chest pain and palpitations.  Gastrointestinal: Negative for abdominal pain, diarrhea and vomiting.  Genitourinary: Negative for dysuria and hematuria.  Musculoskeletal: Negative for arthralgias and back pain.  Skin: Negative for color change and rash.  Neurological: Negative for seizures and syncope.  All other systems reviewed and are negative.   Physical  Exam Updated Vital Signs BP 126/66   Pulse 100   Temp 98.7 F (37.1 C) (Oral)   Resp (!) 25   Ht 5\' 10"  (1.778 m)   Wt 95.3 kg   LMP 02/03/2020   SpO2 100%   BMI 30.13 kg/m   Physical Exam Vitals and nursing note reviewed.  Constitutional:      General: She is not in acute distress.    Appearance: She is well-developed. She is not ill-appearing.  HENT:     Head: Normocephalic and atraumatic.  Eyes:     Conjunctiva/sclera: Conjunctivae normal.  Cardiovascular:     Rate and Rhythm: Regular rhythm. Tachycardia present.     Heart sounds: No murmur heard.   Pulmonary:     Effort: Pulmonary effort is normal. No respiratory distress.     Breath sounds: Normal breath sounds.  Abdominal:     Palpations:  Abdomen is soft.     Tenderness: There is no abdominal tenderness.     Comments: Abdominal wound vac in place.  No surrounding erythema or purulence.  Musculoskeletal:     Cervical back: Neck supple.  Skin:    General: Skin is warm and dry.  Neurological:     General: No focal deficit present.     Mental Status: She is alert and oriented to person, place, and time. Mental status is at baseline.  Psychiatric:        Mood and Affect: Mood normal.        Behavior: Behavior normal.     ED Results / Procedures / Treatments   Labs (all labs ordered are listed, but only abnormal results are displayed) Labs Reviewed  COMPREHENSIVE METABOLIC PANEL - Abnormal; Notable for the following components:      Result Value   Sodium 132 (*)    CO2 21 (*)    Glucose, Bld 119 (*)    Calcium 8.2 (*)    Total Protein 6.2 (*)    Albumin 1.8 (*)    Alkaline Phosphatase 284 (*)    All other components within normal limits  CBC WITH DIFFERENTIAL/PLATELET - Abnormal; Notable for the following components:   WBC 14.9 (*)    RBC 2.82 (*)    Hemoglobin 7.6 (*)    HCT 26.4 (*)    MCHC 28.8 (*)    RDW 23.0 (*)    nRBC 0.6 (*)    Neutro Abs 11.2 (*)    Abs Immature Granulocytes 0.14 (*)     All other components within normal limits  PROTIME-INR - Abnormal; Notable for the following components:   Prothrombin Time 16.8 (*)    INR 1.4 (*)    All other components within normal limits  URINALYSIS, ROUTINE W REFLEX MICROSCOPIC - Abnormal; Notable for the following components:   Ketones, ur 20 (*)    All other components within normal limits  PROTIME-INR - Abnormal; Notable for the following components:   Prothrombin Time 17.0 (*)    INR 1.4 (*)    All other components within normal limits  PHOSPHORUS - Abnormal; Notable for the following components:   Phosphorus 1.6 (*)    All other components within normal limits  COMPREHENSIVE METABOLIC PANEL - Abnormal; Notable for the following components:   Sodium 133 (*)    CO2 18 (*)    Glucose, Bld 117 (*)    Calcium 8.3 (*)    Total Protein 5.7 (*)    Albumin 1.6 (*)    Alkaline Phosphatase 219 (*)    All other components within normal limits  CBC - Abnormal; Notable for the following components:   WBC 11.4 (*)    RBC 2.51 (*)    Hemoglobin 6.8 (*)    HCT 23.5 (*)    MCHC 28.9 (*)    RDW 22.7 (*)    nRBC 0.8 (*)    All other components within normal limits  SARS CORONAVIRUS 2 BY RT PCR (HOSPITAL ORDER, [facility])  CULTURE, BLOOD (ROUTINE X 2)  CULTURE, BLOOD (ROUTINE X 2)  LACTIC ACID, PLASMA  LACTIC ACID, PLASMA  CORTISOL-AM, BLOOD  PROCALCITONIN  MAGNESIUM  I-STAT BETA HCG BLOOD, ED ([facility], [facility], [facility] ONLY)  PREPARE RBC (CROSSMATCH)  TYPE AND SCREEN    EKG EKG Interpretation  Date/Time:  Friday February 25 2020 20:37:51 EDT Ventricular Rate:  107 PR Interval:    QRS Duration:  98 QT Interval:  338 QTC Calculation: 451 R Axis:   57 Text Interpretation: Sinus tachycardia No significant change since last tracing Confirmed by Wandra Arthurs [id number]) on 02/25/2020 8:39:34 PM Also confirmed by Wandra Arthurs [phone]), editor Tomales, LaVerne [phone])  on 02/26/2020 9:11:18 AM   Radiology DG Chest 2  View  Result Date: 02/25/2020 CLINICAL DATA:  Sepsis EXAM: CHEST - 2 VIEW COMPARISON:  02/21/2020 FINDINGS: Left pleural effusion with left lower lobe and atelectasis or infiltrate. Minimal right base density, likely atelectasis. Heart is normal size. No effusions. IMPRESSION: Small left pleural effusion with left lower lobe atelectasis or pneumonia. Electronically Signed   By: Rolm Baptise M.D.   On: 02/25/2020 18:08   CT ABDOMEN PELVIS W CONTRAST  Result Date: 02/25/2020 CLINICAL DATA:  Abdomen distension EXAM: CT ABDOMEN AND PELVIS WITH CONTRAST TECHNIQUE: Multidetector CT imaging of the abdomen and pelvis was performed using the standard protocol following bolus administration of intravenous contrast. CONTRAST:  173mL OMNIPAQUE IOHEXOL 300 MG/ML  SOLN COMPARISON:  CT 02/18/2020, 02/09/2020 FINDINGS: Lower chest: Lung bases demonstrate small left pleural effusion and left basilar consolidation which may be due to atelectasis or pneumonia. This does not appear significantly changed since 02/18/2020. Stable cardiac size. Hepatobiliary: No focal hepatic abnormality. Status post cholecystectomy. No biliary dilatation. Pancreas: No ductal dilatation. Similar residual inflammatory changes in the left upper quadrant with multiple mildly rim enhancing fluid collections adjacent to the pancreatic tail and posterior to the left kidney presumably representing chronic pseudocysts. Spleen: Enlarged, measuring up to 16 cm in size. Adrenals/Urinary Tract: Adrenal glands are within normal limits. Kidneys show no hydronephrosis. The bladder is normal Stomach/Bowel: Status post gastric bypass. Crescent shaped gas and fluid collection in the left upper quadrant adjacent to the spleen appears contiguous with the bypass stomach. Gastrostomy tube and balloon present within the bypassed stomach. Left upper quadrant rim enhancing gas and fluid collection further decreased in size, now measuring 4 x 2.8 cm, previously 6.8 x 4.2 cm.  Mild fluid distension of left lower abdominal small bowel near the anastomosis. Remainder of the small bowel is nondistended. The appendix is normal. There is mild wall thickening of the splenic flexure. Vascular/Lymphatic: Nonaneurysmal aorta.  No suspicious nodes Reproductive: Fibroid uterus.  No suspicious adnexal mass. Other: No pelvic effusion. No free air. Increased bilateral flank edema. Generalized mesenteric inflammatory process in the left upper quadrant and mid abdomen without significant change. Surgical drain with tip terminating in the left upper quadrant adjacent to the gas and fluid collection. Musculoskeletal: No acute osseous abnormality. Stable scattered lucent lesions within the pelvic bones and vertebra. IMPRESSION: 1. Prior gastric bypass and placement of percutaneous gastrostomy tube within the bypassed stomach. Interval decrease in size of the previously noted left upper quadrant mesenteric gas and fluid collection, now measuring 4 cm maximum. No new or increasing fluid collections are visualized. 2. Residual inflammatory process within the left upper quadrant and mid abdominal region. Multiple irregular rim enhancing fluid collections adjacent to the pancreatic tail and posterior to the left kidney without significant change and thought to represent pseudocysts. 3. Fibroid uterus 4. Small left pleural effusion with partial consolidation in the left lower lobe, atelectasis versus pneumonia. 5. Enlarged spleen Electronically Signed   By: Donavan Foil M.D.   On: 02/25/2020 22:31    Procedures Procedures (including critical care time)  Medications Ordered in ED Medications  ondansetron (ZOFRAN-ODT) disintegrating tablet 4 mg (has no administration in time range)  pantoprazole sodium (PROTONIX)  40 mg/20 mL oral suspension 40 mg (has no administration in time range)  sucralfate (CARAFATE) 1 GM/10ML suspension 1 g (has no administration in time range)  magnesium oxide (MAG-OX) tablet  400 mg (400 mg Oral Given 02/26/20 1005)  multivitamin liquid (has no administration in time range)  enoxaparin (LOVENOX) injection 40 mg (has no administration in time range)  acetaminophen (TYLENOL) tablet 650 mg (650 mg Oral Given 02/26/20 0432)    Or  acetaminophen (TYLENOL) suppository 650 mg ( Rectal See Alternative 02/26/20 0432)  0.9 %  sodium chloride infusion ( Intravenous Rate/Dose Change 02/26/20 0352)  cefTRIAXone (ROCEPHIN) 2 g in sodium chloride 0.9 % 100 mL IVPB (has no administration in time range)  polyethylene glycol (MIRALAX / GLYCOLAX) packet 17 g (has no administration in time range)  phosphorus (K PHOS NEUTRAL) tablet 1,000 mg (has no administration in time range)  oxyCODONE (Oxy IR/ROXICODONE) immediate release tablet 5 mg (5 mg Oral Given 02/26/20 1005)  0.9 %  sodium chloride infusion (Manually program via Guardrails IV Fluids) (has no administration in time range)  sodium chloride 0.9 % bolus 1,000 mL (0 mLs Intravenous Stopped 02/26/20 0426)  metroNIDAZOLE (FLAGYL) IVPB 500 mg (0 mg Intravenous Stopped 02/26/20 0128)  cefTRIAXone (ROCEPHIN) 2 g in sodium chloride 0.9 % 100 mL IVPB (0 g Intravenous Stopped 02/26/20 0128)  iohexol (OMNIPAQUE) 300 MG/ML solution 100 mL (100 mLs Intravenous Contrast Given 02/25/20 2139)    ED Course  I have reviewed the triage vital signs and the nursing notes.  Pertinent labs & imaging results that were available during my care of the patient were reviewed by me and considered in my medical decision making (see chart for details).    MDM Rules/Calculators/A&P                          39 year old female with recent admission for abdominal presents with fever.  Patient was recently discharged from the hospital on 8/11 after being admitted for an abdominal abscess and requiring IV antibiotics.  Patient was discharged home on a wound VAC however today when the wound VAC nurse arrived at her home.  She had a temperature of 101 and the wound VAC  nurse was concerned that the wound VAC may have been infected.  Patient is currently denying any fevers, chills, chest pain, shortness of breath, abdominal pain, nausea, vomiting, diarrhea.  Temperature 100 in the emergency department.  Patient tachycardic to the 100 and low 110s.  Remainder vital signs stable.  Exam as above.  No abdominal tenderness on physical exam.  No evidence of any erythema or purulence surrounding the abdominal wound.  Labs most notable for white count of 14.9.  Lactate 1.1.  Remainder of CBC, CMP, UA unremarkable.  Blood cultures drawn.  CT abdomen pelvis redemonstrated patient's previous abscess which had decreased to a size of approximately 4 cm.  Given that patient is currently refever and is tachycardic we will treat patient with IV antibiotics Rocephin and Flagyl and treat her with fluids.  Given that she was recently discharged from the hospital and is presenting again with signs of infection feel that patient will require admission for further IV antibiotics.  Contacted the family medicine team who had recently admitted the patient.  Family medicine evaluated the patient and agree with admission to their service.  Patient is admitted to the family medicine service in stable condition.  Patient had no further events while in the emergency department  during my shift.  Final Clinical Impression(s) / ED Diagnoses Final diagnoses:  Intra-abdominal abscess [facility])    Rx / [facility] Orders ED Discharge Orders    None       Silvestre Gunner, MD 02/26/20 1037    Drenda Freeze, MD 02/29/20 1534

## 2020-02-25 NOTE — ED Notes (Signed)
Patient transported to CT 

## 2020-02-26 DIAGNOSIS — R161 Splenomegaly, not elsewhere classified: Secondary | ICD-10-CM | POA: Diagnosis present

## 2020-02-26 DIAGNOSIS — E878 Other disorders of electrolyte and fluid balance, not elsewhere classified: Secondary | ICD-10-CM | POA: Diagnosis present

## 2020-02-26 DIAGNOSIS — R5082 Postprocedural fever: Secondary | ICD-10-CM | POA: Diagnosis not present

## 2020-02-26 DIAGNOSIS — E739 Lactose intolerance, unspecified: Secondary | ICD-10-CM | POA: Diagnosis present

## 2020-02-26 DIAGNOSIS — D62 Acute posthemorrhagic anemia: Secondary | ICD-10-CM | POA: Diagnosis present

## 2020-02-26 DIAGNOSIS — E871 Hypo-osmolality and hyponatremia: Secondary | ICD-10-CM

## 2020-02-26 DIAGNOSIS — J9811 Atelectasis: Secondary | ICD-10-CM | POA: Diagnosis present

## 2020-02-26 DIAGNOSIS — A419 Sepsis, unspecified organism: Secondary | ICD-10-CM

## 2020-02-26 DIAGNOSIS — R1084 Generalized abdominal pain: Secondary | ICD-10-CM | POA: Diagnosis not present

## 2020-02-26 DIAGNOSIS — Z79899 Other long term (current) drug therapy: Secondary | ICD-10-CM | POA: Diagnosis not present

## 2020-02-26 DIAGNOSIS — Z9049 Acquired absence of other specified parts of digestive tract: Secondary | ICD-10-CM | POA: Diagnosis not present

## 2020-02-26 DIAGNOSIS — K289 Gastrojejunal ulcer, unspecified as acute or chronic, without hemorrhage or perforation: Secondary | ICD-10-CM | POA: Diagnosis present

## 2020-02-26 DIAGNOSIS — K219 Gastro-esophageal reflux disease without esophagitis: Secondary | ICD-10-CM | POA: Diagnosis present

## 2020-02-26 DIAGNOSIS — Z880 Allergy status to penicillin: Secondary | ICD-10-CM | POA: Diagnosis not present

## 2020-02-26 DIAGNOSIS — Z20822 Contact with and (suspected) exposure to covid-19: Secondary | ICD-10-CM | POA: Diagnosis present

## 2020-02-26 DIAGNOSIS — Z9884 Bariatric surgery status: Secondary | ICD-10-CM | POA: Diagnosis not present

## 2020-02-26 DIAGNOSIS — Z6841 Body Mass Index (BMI) 40.0 and over, adult: Secondary | ICD-10-CM | POA: Diagnosis not present

## 2020-02-26 DIAGNOSIS — Z8249 Family history of ischemic heart disease and other diseases of the circulatory system: Secondary | ICD-10-CM | POA: Diagnosis not present

## 2020-02-26 DIAGNOSIS — K651 Peritoneal abscess: Secondary | ICD-10-CM | POA: Diagnosis present

## 2020-02-26 DIAGNOSIS — R509 Fever, unspecified: Secondary | ICD-10-CM | POA: Diagnosis not present

## 2020-02-26 DIAGNOSIS — D519 Vitamin B12 deficiency anemia, unspecified: Secondary | ICD-10-CM | POA: Diagnosis present

## 2020-02-26 DIAGNOSIS — K863 Pseudocyst of pancreas: Secondary | ICD-10-CM | POA: Diagnosis present

## 2020-02-26 DIAGNOSIS — Z825 Family history of asthma and other chronic lower respiratory diseases: Secondary | ICD-10-CM | POA: Diagnosis not present

## 2020-02-26 DIAGNOSIS — E876 Hypokalemia: Secondary | ICD-10-CM | POA: Diagnosis present

## 2020-02-26 DIAGNOSIS — F17210 Nicotine dependence, cigarettes, uncomplicated: Secondary | ICD-10-CM | POA: Diagnosis present

## 2020-02-26 LAB — COMPREHENSIVE METABOLIC PANEL
ALT: 10 U/L (ref 0–44)
AST: 15 U/L (ref 15–41)
Albumin: 1.6 g/dL — ABNORMAL LOW (ref 3.5–5.0)
Alkaline Phosphatase: 219 U/L — ABNORMAL HIGH (ref 38–126)
Anion gap: 10 (ref 5–15)
BUN: 6 mg/dL (ref 6–20)
CO2: 18 mmol/L — ABNORMAL LOW (ref 22–32)
Calcium: 8.3 mg/dL — ABNORMAL LOW (ref 8.9–10.3)
Chloride: 105 mmol/L (ref 98–111)
Creatinine, Ser: 0.64 mg/dL (ref 0.44–1.00)
GFR calc Af Amer: >60 mL/min (ref >60)
GFR calc non Af Amer: >60 mL/min (ref >60)
Glucose, Bld: 117 mg/dL — ABNORMAL HIGH (ref 70–99)
Potassium: 3.7 mmol/L (ref 3.5–5.1)
Sodium: 133 mmol/L — ABNORMAL LOW (ref 135–145)
Total Bilirubin: 1 mg/dL (ref 0.3–1.2)
Total Protein: 5.7 g/dL — ABNORMAL LOW (ref 6.5–8.1)

## 2020-02-26 LAB — CBC
HCT: 23.5 % — ABNORMAL LOW (ref 36.0–46.0)
Hemoglobin: 6.8 g/dL — CL (ref 12.0–15.0)
MCH: 27.1 pg (ref 26.0–34.0)
MCHC: 28.9 g/dL — ABNORMAL LOW (ref 30.0–36.0)
MCV: 93.6 fL (ref 80.0–100.0)
Platelets: 275 10*3/uL (ref 150–400)
RBC: 2.51 MIL/uL — ABNORMAL LOW (ref 3.87–5.11)
RDW: 22.7 % — ABNORMAL HIGH (ref 11.5–15.5)
WBC: 11.4 10*3/uL — ABNORMAL HIGH (ref 4.0–10.5)
nRBC: 0.8 % — ABNORMAL HIGH (ref 0.0–0.2)

## 2020-02-26 LAB — PROTIME-INR
INR: 1.4 — ABNORMAL HIGH (ref 0.8–1.2)
Prothrombin Time: 17 seconds — ABNORMAL HIGH (ref 11.4–15.2)

## 2020-02-26 LAB — PHOSPHORUS: Phosphorus: 1.6 mg/dL — ABNORMAL LOW (ref 2.5–4.6)

## 2020-02-26 LAB — HEMOGLOBIN AND HEMATOCRIT, BLOOD
HCT: 25.2 % — ABNORMAL LOW (ref 36.0–46.0)
Hemoglobin: 7.6 g/dL — ABNORMAL LOW (ref 12.0–15.0)

## 2020-02-26 LAB — MAGNESIUM: Magnesium: 1.7 mg/dL (ref 1.7–2.4)

## 2020-02-26 LAB — PREPARE RBC (CROSSMATCH)

## 2020-02-26 LAB — PROCALCITONIN: Procalcitonin: 0.33 ng/mL

## 2020-02-26 LAB — CORTISOL-AM, BLOOD: Cortisol - AM: 13.3 ug/dL (ref 6.7–22.6)

## 2020-02-26 MED ORDER — ONDANSETRON 4 MG PO TBDP: 4.0000 mg | ORAL_TABLET | Freq: Three times a day (TID) | ORAL | Status: DC | PRN

## 2020-02-26 MED ORDER — PANTOPRAZOLE SODIUM 40 MG PO PACK
40.0000 mg | PACK | Freq: Two times a day (BID) | ORAL | Status: DC
  Administered 2020-02-26 – 2020-02-29 (×7): 40 mg via ORAL
  Filled 2020-02-26 (×8): qty 20

## 2020-02-26 MED ORDER — K PHOS MONO-SOD PHOS DI & MONO 155-852-130 MG PO TABS
1000.0000 mg | ORAL_TABLET | Freq: Three times a day (TID) | ORAL | Status: DC
  Administered 2020-02-26 – 2020-02-29 (×8): 1000 mg via ORAL
  Filled 2020-02-26 (×13): qty 4

## 2020-02-26 MED ORDER — POTASSIUM PHOSPHATE MONOBASIC 500 MG PO TABS
1000.0000 mg | ORAL_TABLET | Freq: Three times a day (TID) | ORAL | Status: DC
  Filled 2020-02-26: qty 2

## 2020-02-26 MED ORDER — SODIUM CHLORIDE 0.9 % IV SOLN
2.0000 g | Freq: Three times a day (TID) | INTRAVENOUS | Status: DC
  Administered 2020-02-26 – 2020-02-28 (×5): 2 g via INTRAVENOUS
  Filled 2020-02-26 (×5): qty 2

## 2020-02-26 MED ORDER — ACETAMINOPHEN 650 MG RE SUPP: 650.0000 mg | Freq: Four times a day (QID) | RECTAL | Status: DC | PRN

## 2020-02-26 MED ORDER — POLYETHYLENE GLYCOL 3350 17 G PO PACK: 17.0000 g | PACK | Freq: Every day | ORAL | Status: DC | PRN

## 2020-02-26 MED ORDER — SODIUM CHLORIDE 0.9 % IV SOLN: 2.0000 g | INTRAVENOUS | Status: DC

## 2020-02-26 MED ORDER — SUCRALFATE 1 GM/10ML PO SUSP
1.0000 g | Freq: Three times a day (TID) | ORAL | Status: DC
  Administered 2020-02-26 – 2020-02-29 (×12): 1 g via ORAL
  Filled 2020-02-26 (×13): qty 10

## 2020-02-26 MED ORDER — OXYCODONE HCL 5 MG PO TABS
5.0000 mg | ORAL_TABLET | ORAL | Status: DC | PRN
  Administered 2020-02-26 – 2020-02-27 (×6): 5 mg via ORAL
  Filled 2020-02-26 (×6): qty 1

## 2020-02-26 MED ORDER — METRONIDAZOLE IN NACL 5-0.79 MG/ML-% IV SOLN
500.0000 mg | Freq: Three times a day (TID) | INTRAVENOUS | Status: DC
  Administered 2020-02-26 – 2020-02-28 (×5): 500 mg via INTRAVENOUS
  Filled 2020-02-26 (×5): qty 100

## 2020-02-26 MED ORDER — ADULT MULTIVITAMIN LIQUID CH
Freq: Every day | ORAL | Status: DC
  Administered 2020-02-26 – 2020-02-29 (×4): 15 mL via ORAL
  Filled 2020-02-26 (×4): qty 15

## 2020-02-26 MED ORDER — OXYCODONE HCL 5 MG PO TABS
5.0000 mg | ORAL_TABLET | Freq: Four times a day (QID) | ORAL | Status: DC | PRN
  Administered 2020-02-26: 5 mg via ORAL
  Filled 2020-02-26: qty 1

## 2020-02-26 MED ORDER — ENOXAPARIN SODIUM 40 MG/0.4ML ~~LOC~~ SOLN
40.0000 mg | Freq: Every day | SUBCUTANEOUS | Status: DC
  Administered 2020-02-26 – 2020-02-27 (×2): 40 mg via SUBCUTANEOUS
  Filled 2020-02-26 (×2): qty 0.4

## 2020-02-26 MED ORDER — ACETAMINOPHEN 325 MG PO TABS
650.0000 mg | ORAL_TABLET | Freq: Four times a day (QID) | ORAL | Status: DC | PRN
  Administered 2020-02-26 – 2020-02-27 (×3): 650 mg via ORAL
  Filled 2020-02-26 (×3): qty 2

## 2020-02-26 MED ORDER — SODIUM CHLORIDE 0.9 % IV SOLN: INTRAVENOUS | Status: DC

## 2020-02-26 MED ORDER — MAGNESIUM OXIDE 400 (241.3 MG) MG PO TABS
400.0000 mg | ORAL_TABLET | Freq: Every day | ORAL | Status: DC
  Administered 2020-02-26 – 2020-02-29 (×4): 400 mg via ORAL
  Filled 2020-02-26 (×4): qty 1

## 2020-02-26 MED ORDER — SODIUM CHLORIDE 0.9% IV SOLUTION: Freq: Once | INTRAVENOUS | Status: DC

## 2020-02-26 NOTE — ED Notes (Signed)
IV team at bedside 

## 2020-02-26 NOTE — ED Notes (Signed)
IV bolus continues to infuse. Pt advised to maintain arm straight. Attempted second IV unsuccessful d/t pt's poor vasculature. Pt requests IV placed elsewhere. IV team consult placed

## 2020-02-26 NOTE — Progress Notes (Signed)
NEW ADMISSION NOTE  Arrival Method: bed Mental Orientation: Alert orientedx4 Telemetry: no Assessment: Completed Skin: See LDA wound vac for abdomen from recent abdominal surgery Iv: right upper arm Pain: 0 Tubes: 2 Safety Measures: Safety Fall Prevention, bed in lowest position Admission: Completed [facility] Orientation: Patient has been orientated to the room, unit and staff.  Family: 0  Orders have been reviewed and implemented. Will continue to monitor the patient. Call light has been placed within reach and bed alarm has been activated.  Patient is a poor historian and unable to answer admitting questions  Beatris Ship, RN

## 2020-02-26 NOTE — Progress Notes (Signed)
FPTS Interim Progress Note  S: Patient is reporting increased pain in the left side.  O: BP 120/72   Pulse (!) 115   Temp 99 F (37.2 C) (Oral)   Resp (!) 27   Ht 5\' 10"  (1.778 m)   Wt 95.3 kg   LMP 02/03/2020   SpO2 96%   BMI 30.13 kg/m   General: Patient resting in bed, NAD CV: Tachycardic, regular rhythm, no murmur Respiratory: Unable lower lobes as patient had pain need for, upper lobes clear bilaterally, deep breaths agitated left side pain GI: OG tube is clamped with brown fluid in tubing, JP drain has serosanguineous fluid clogging tubing, ex lap site does not appear to be infected Extremities: 3+ pitting edema to the knee bilaterally  A/P: We are awaiting surgeries recs.  We will add metronidazole to antibiotic regimen.  Increasing OxyIR every 4 as needed will attempt return 6 as needed tomorrow.  Freida Busman, MD 02/26/2020, 6:45 AM PGY-1, [facility] Medicine Service pager [phone]

## 2020-02-26 NOTE — ED Notes (Signed)
Report from [place], [place].  Lab at bedside.

## 2020-02-26 NOTE — Progress Notes (Signed)
PT Cancellation Note  Patient Details Name: Alexis Avila MRN: [id number] DOB: 1980/12/31   Cancelled Treatment:    Reason Eval/Treat Not Completed: Fatigue/lethargy limiting ability to participate. Pt reports she has been able to get up to [facility] this afternoon, but is too fatigued from lack of sleep to participate in therapy evaluation at this time. PT will continue to follow and eval as time/schedule allow.   Karma Ganja, PT, DPT   Acute Rehabilitation Department Pager #: [phone]   Otho Bellows 02/26/2020, 4:13 PM

## 2020-02-26 NOTE — Progress Notes (Addendum)
FPTS Interim Progress Note  S: Paged by RN that regarding MEWs 4 and patient complaining of worsening peripheral edema. On arrival pt, alert and in no distress. She said she feels about the same as she did on admission except earlier today her legs became more swollen while she was in the ED. On her previous admission she also had this swelling which improved with Lasix. Also endorses pain at her surgical site which improves with oxycodone 10mg .  O: BP (!) 107/56 (BP Location: Right Arm)   Pulse (!) 113   Temp 100.2 F (37.9 C)   Resp (!) 21   Ht 5\' 10"  (1.778 m)   Wt (!) 138.6 kg   LMP 02/03/2020   SpO2 94%   BMI 43.84 kg/m    General: Alert, appears stated age, tired Cardio: Normal S1 and S2, RRR   Pulm: Bibasalar crackles, no wheeze, mild tachypnea  Abdomen: abdomen non-distended, GT tube draining serosanginous fluid. Purulent material surrounding site. Mid line wound VAC in place. JP in place draining creamy/brown liquid. Generalised tenderness on palpation of abdomen. Extremities: 3+ pitting edema bilaterally up to knees, warm/ well perfused. Neuro: Cranial nerves grossly intact  A/P: Pt's BP 107/56, HR 115, RR 21 and T102 likely 2/2 intraabdominal sepsis. Blood cx drawn today on admission and started on Flagyl and Ceftriaxone. H&H follow tranfusion 7.6  -Change to medical telemetry -Continue Flagyl, switched Ceftriaxone to Cefipime to broaden coverage  -Oxycodone 5mg  Q4PRN -Tylenol 650mg  Q6PRN  -RN aware to page if abdominal/leg pain increase, can have further one time 5mg  oxycodone if required -continue surgery recs -Monitor Hb with CBC in am -Continue mIVF at 75 ml/hr, will consider bolus/increasing if becomes further hypotensive/symptomatic. Will hold off for now as peripheral edema is worsening    Lattie Haw, MD 02/26/2020, 9:42 PM PGY-2, [place] Medicine Service pager [phone]

## 2020-02-26 NOTE — Progress Notes (Signed)
[facility] Progress Note     Subjective: Patient was recently discharged 8/11 and now returns 8/13 with fever and abdominal pain. She reports pain in L back this AM. Denies nausea. Was tolerating PO intake and having bowel function at home.   Objective: Vital signs in last 24 hours: Temp:  [98.5 F (36.9 C)-100.9 F (38.3 C)] 99 F (37.2 C) (08/14 0538) Pulse Rate:  [95-122] 115 (08/14 0538) Resp:  [16-27] 27 (08/14 0538) BP: (101-123)/(49-76) 120/72 (08/14 0538) SpO2:  [90 %-100 %] 96 % (08/14 0538) Weight:  [95.3 kg-136.1 kg] 95.3 kg (08/13 2117)    Intake/Output from previous day: 08/13 0701 - 08/14 0700 In: 1000 [IV Piggyback:1000] Out: -  Intake/Output this shift: No intake/output data recorded.  PE: Gen: Alert, NAD, pleasant Pulm: rate andeffort normal.  Abd: Soft,mild distension,tender over drain.midline woundwith vac in place.JP in place withlight brown drainage in bulb. G-tube clamped with bilious fluid in tubing  Psych: A&Ox3  Skin: no rashes noted, warm and dry   Lab Results:  Recent Labs    02/25/20 1440  WBC 14.9*  HGB 7.6*  HCT 26.4*  PLT 274   BMET Recent Labs    02/25/20 1440  NA 132*  K 3.6  CL 103  CO2 21*  GLUCOSE 119*  BUN 6  CREATININE 0.60  CALCIUM 8.2*   PT/INR Recent Labs    02/25/20 1440  LABPROT 16.8*  INR 1.4*   CMP     Component Value Date/Time   NA 132 (L) 02/25/2020 1440   K 3.6 02/25/2020 1440   CL 103 02/25/2020 1440   CO2 21 (L) 02/25/2020 1440   GLUCOSE 119 (H) 02/25/2020 1440   BUN 6 02/25/2020 1440   CREATININE 0.60 02/25/2020 1440   CALCIUM 8.2 (L) 02/25/2020 1440   PROT 6.2 (L) 02/25/2020 1440   ALBUMIN 1.8 (L) 02/25/2020 1440   AST 17 02/25/2020 1440   ALT 12 02/25/2020 1440   ALKPHOS 284 (H) 02/25/2020 1440   BILITOT 0.9 02/25/2020 1440   GFRNONAA >60 02/25/2020 1440   GFRAA >60 02/25/2020 1440   Lipase     Component Value Date/Time   LIPASE 202 (H) 02/19/2020 0750        Studies/Results: DG Chest 2 View  Result Date: 02/25/2020 CLINICAL DATA:  Sepsis EXAM: CHEST - 2 VIEW COMPARISON:  02/21/2020 FINDINGS: Left pleural effusion with left lower lobe and atelectasis or infiltrate. Minimal right base density, likely atelectasis. Heart is normal size. No effusions. IMPRESSION: Small left pleural effusion with left lower lobe atelectasis or pneumonia. Electronically Signed   By: Rolm Baptise M.D.   On: 02/25/2020 18:08   CT ABDOMEN PELVIS W CONTRAST  Result Date: 02/25/2020 CLINICAL DATA:  Abdomen distension EXAM: CT ABDOMEN AND PELVIS WITH CONTRAST TECHNIQUE: Multidetector CT imaging of the abdomen and pelvis was performed using the standard protocol following bolus administration of intravenous contrast. CONTRAST:  135mL OMNIPAQUE IOHEXOL 300 MG/ML  SOLN COMPARISON:  CT 02/18/2020, 02/09/2020 FINDINGS: Lower chest: Lung bases demonstrate small left pleural effusion and left basilar consolidation which may be due to atelectasis or pneumonia. This does not appear significantly changed since 02/18/2020. Stable cardiac size. Hepatobiliary: No focal hepatic abnormality. Status post cholecystectomy. No biliary dilatation. Pancreas: No ductal dilatation. Similar residual inflammatory changes in the left upper quadrant with multiple mildly rim enhancing fluid collections adjacent to the pancreatic tail and posterior to the left kidney presumably representing chronic pseudocysts. Spleen: Enlarged, measuring up to 16  cm in size. Adrenals/Urinary Tract: Adrenal glands are within normal limits. Kidneys show no hydronephrosis. The bladder is normal Stomach/Bowel: Status post gastric bypass. Crescent shaped gas and fluid collection in the left upper quadrant adjacent to the spleen appears contiguous with the bypass stomach. Gastrostomy tube and balloon present within the bypassed stomach. Left upper quadrant rim enhancing gas and fluid collection further decreased in size, now  measuring 4 x 2.8 cm, previously 6.8 x 4.2 cm. Mild fluid distension of left lower abdominal small bowel near the anastomosis. Remainder of the small bowel is nondistended. The appendix is normal. There is mild wall thickening of the splenic flexure. Vascular/Lymphatic: Nonaneurysmal aorta.  No suspicious nodes Reproductive: Fibroid uterus.  No suspicious adnexal mass. Other: No pelvic effusion. No free air. Increased bilateral flank edema. Generalized mesenteric inflammatory process in the left upper quadrant and mid abdomen without significant change. Surgical drain with tip terminating in the left upper quadrant adjacent to the gas and fluid collection. Musculoskeletal: No acute osseous abnormality. Stable scattered lucent lesions within the pelvic bones and vertebra. IMPRESSION: 1. Prior gastric bypass and placement of percutaneous gastrostomy tube within the bypassed stomach. Interval decrease in size of the previously noted left upper quadrant mesenteric gas and fluid collection, now measuring 4 cm maximum. No new or increasing fluid collections are visualized. 2. Residual inflammatory process within the left upper quadrant and mid abdominal region. Multiple irregular rim enhancing fluid collections adjacent to the pancreatic tail and posterior to the left kidney without significant change and thought to represent pseudocysts. 3. Fibroid uterus 4. Small left pleural effusion with partial consolidation in the left lower lobe, atelectasis versus pneumonia. 5. Enlarged spleen Electronically Signed   By: Donavan Foil M.D.   On: 02/25/2020 22:31    Anti-infectives: Anti-infectives (From admission, onward)   Start     Dose/Rate Route Frequency Ordered Stop   02/26/20 2200  cefTRIAXone (ROCEPHIN) 2 g in sodium chloride 0.9 % 100 mL IVPB     Discontinue     2 g 200 mL/hr over 30 Minutes Intravenous Every 24 hours 02/26/20 0032     02/25/20 2100  piperacillin-tazobactam (ZOSYN) IVPB 2.25 g  Status:   Discontinued        2.25 g 100 mL/hr over 30 Minutes Intravenous  Once 02/25/20 2047 02/25/20 2052   02/25/20 2100  metroNIDAZOLE (FLAGYL) IVPB 500 mg        500 mg 100 mL/hr over 60 Minutes Intravenous  Once 02/25/20 2047 02/26/20 0128   02/25/20 2100  cefTRIAXone (ROCEPHIN) 1 g in sodium chloride 0.9 % 100 mL IVPB  Status:  Discontinued        1 g 200 mL/hr over 30 Minutes Intravenous  Once 02/25/20 2049 02/25/20 2052   02/25/20 2100  cefTRIAXone (ROCEPHIN) 2 g in sodium chloride 0.9 % 100 mL IVPB        2 g 200 mL/hr over 30 Minutes Intravenous  Once 02/25/20 2052 02/26/20 0128       Assessment/Plan Marginal ulcer -continuecarafate andprotonix crushed - not taken whole. 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 Left abdominal wall hematoma/abscess with possible perforation. Serosal tear, Roux limb -S/p exp lap, lysis of adhesions >2hrs, drainage of intra-abdominal hematoma/abscess, insertion of G tube in remnant stomach, partial resection/reconstruction of roux limb 8/2 Dr [name] - POD #12 - CT 8/6 showed no leak  and no free air, surgical drain in appropriate location near L retroperitoneal fluid collection -CT 8/13 shows interval decrease in size of LUQ collection, enlarged spleen and pancreatic pseudocysts  -G-tube in remnant stomachclamped - can put to gravity to drain prn -WBC decreasing, 11.4 from 14.9 - continue IV abx - I do not see any indication for emergent surgical intervention at this time - can try bariatric clears - if patient becomes more nauseated, open G-tube to gravity and make NPO - unsure why spleen is enlarged - may just be reactive - JP draining purulent material, continue drain  FEN -IVF, Bariatric clears ID - Rocephin/[id number]/7. Afebrile VTE prophylaxis - scds,lovenox Foley - out Follow-Up - Dr.  [name]  Plan:Continue ABX. Continue drain and VAC. Clears ok. Pain control. We will follow.   LOS: 0 days    Norm Parcel , [facility] 02/26/2020, 7:40 AM Please see Amion for pager number during day hours 7:00am-4:30pm

## 2020-02-26 NOTE — Progress Notes (Signed)
Patient's family member brought wound vac cannister's from home.  Cannister changed without problem.  150 cc serosanguinous clumpy drainage in wound vac cannister.  Earleen Reaper RN

## 2020-02-27 DIAGNOSIS — R509 Fever, unspecified: Secondary | ICD-10-CM

## 2020-02-27 DIAGNOSIS — A419 Sepsis, unspecified organism: Secondary | ICD-10-CM | POA: Diagnosis not present

## 2020-02-27 DIAGNOSIS — K651 Peritoneal abscess: Secondary | ICD-10-CM | POA: Diagnosis not present

## 2020-02-27 DIAGNOSIS — R161 Splenomegaly, not elsewhere classified: Secondary | ICD-10-CM | POA: Diagnosis not present

## 2020-02-27 LAB — BPAM RBC
Blood Product Expiration Date: 202108312359
ISSUE DATE / TIME: 202108141257
Unit Type and Rh: 6200

## 2020-02-27 LAB — CBC
HCT: 24.5 % — ABNORMAL LOW (ref 36.0–46.0)
Hemoglobin: 7.3 g/dL — ABNORMAL LOW (ref 12.0–15.0)
MCH: 26.4 pg (ref 26.0–34.0)
MCHC: 29.8 g/dL — ABNORMAL LOW (ref 30.0–36.0)
MCV: 88.4 fL (ref 80.0–100.0)
Platelets: 272 10*3/uL (ref 150–400)
RBC: 2.77 MIL/uL — ABNORMAL LOW (ref 3.87–5.11)
RDW: 22.3 % — ABNORMAL HIGH (ref 11.5–15.5)
WBC: 7.6 10*3/uL (ref 4.0–10.5)
nRBC: 1.1 % — ABNORMAL HIGH (ref 0.0–0.2)

## 2020-02-27 LAB — BASIC METABOLIC PANEL
Anion gap: 8 (ref 5–15)
BUN: <5 mg/dL — ABNORMAL LOW (ref 6–20)
CO2: 20 mmol/L — ABNORMAL LOW (ref 22–32)
Calcium: 8.3 mg/dL — ABNORMAL LOW (ref 8.9–10.3)
Chloride: 106 mmol/L (ref 98–111)
Creatinine, Ser: 0.51 mg/dL (ref 0.44–1.00)
GFR calc Af Amer: >60 mL/min (ref >60)
GFR calc non Af Amer: >60 mL/min (ref >60)
Glucose, Bld: 115 mg/dL — ABNORMAL HIGH (ref 70–99)
Potassium: 3.3 mmol/L — ABNORMAL LOW (ref 3.5–5.1)
Sodium: 134 mmol/L — ABNORMAL LOW (ref 135–145)

## 2020-02-27 LAB — MAGNESIUM: Magnesium: 1.6 mg/dL — ABNORMAL LOW (ref 1.7–2.4)

## 2020-02-27 LAB — TYPE AND SCREEN
ABO/RH(D): A POS
Antibody Screen: NEGATIVE
Unit division: 0

## 2020-02-27 LAB — PHOSPHORUS: Phosphorus: 1.5 mg/dL — ABNORMAL LOW (ref 2.5–4.6)

## 2020-02-27 MED ORDER — OXYCODONE HCL 5 MG PO TABS
5.0000 mg | ORAL_TABLET | ORAL | Status: DC | PRN
  Administered 2020-02-27 – 2020-02-28 (×4): 5 mg via ORAL
  Filled 2020-02-27 (×4): qty 1

## 2020-02-27 MED ORDER — SIMETHICONE 80 MG PO CHEW
80.0000 mg | CHEWABLE_TABLET | Freq: Four times a day (QID) | ORAL | Status: AC
  Administered 2020-02-27 – 2020-02-28 (×4): 80 mg via ORAL
  Filled 2020-02-27 (×3): qty 1

## 2020-02-27 MED ORDER — ENSURE ENLIVE PO LIQD
237.0000 mL | Freq: Two times a day (BID) | ORAL | Status: DC
  Administered 2020-02-27 – 2020-02-28 (×4): 237 mL via ORAL

## 2020-02-27 MED ORDER — ACETAMINOPHEN 650 MG RE SUPP
650.0000 mg | Freq: Four times a day (QID) | RECTAL | Status: DC
  Filled 2020-02-27: qty 1

## 2020-02-27 MED ORDER — ENOXAPARIN SODIUM 60 MG/0.6ML ~~LOC~~ SOLN
60.0000 mg | Freq: Every day | SUBCUTANEOUS | Status: DC
  Administered 2020-02-28 – 2020-02-29 (×2): 60 mg via SUBCUTANEOUS
  Filled 2020-02-27 (×2): qty 0.6

## 2020-02-27 MED ORDER — ACETAMINOPHEN 325 MG PO TABS
650.0000 mg | ORAL_TABLET | Freq: Four times a day (QID) | ORAL | Status: DC
  Administered 2020-02-27 – 2020-02-29 (×7): 650 mg via ORAL
  Filled 2020-02-27 (×8): qty 2

## 2020-02-27 MED ORDER — LIDOCAINE 5 % EX PTCH
1.0000 | MEDICATED_PATCH | Freq: Every day | CUTANEOUS | Status: DC
  Administered 2020-02-27: 1 via TRANSDERMAL
  Filled 2020-02-27 (×2): qty 1

## 2020-02-27 NOTE — Hospital Course (Addendum)
Alexis Avila is a 39 y.o. female with a history of recent ex-lap for possible bowel perforation 02/14/20, morbid obesity, GERD, open retrocolic Roux-en-Y gastric bypass (2002), pancreatitis who presented with fever. Hospital course outlined by problem below:  Sepsis Patient was recently discharged on 8/11 s/p ex-lap for possible bowel perforation on 8/2 after receiving a full course of antibiotics and was discharged without antibiotics.  Patient represented with fever in the absence of other symptoms, initially meeting sepsis criteria with tachycardia and leukocytosis.  She was started on empiric broad-spectrum IV antibiotics (cefepime, metronidazole).  CT abdomen with findings of continued intra-abdominal inflammation and splenomegaly, felt to be reactive.  After she remained afebrile for over 24 hours, she was transitioned to oral antibiotics.  She remained afebrile for another 24 hours on oral antibiotics, so she was discharged home with plan for 7-day total course of antibiotics and for home RN wound care. Antibiotics: CTX (8/13), cefepime (8/14-8/16), IV metronidazole (8/13-8/16), PO metronidazole (8/16-8/21), PO clindamycin (8/16-8/21)  Lower extremity edema Patient presented with significant 3+ lower extremity edema bilaterally, likely due to iatrogenic fluid overload from recent admission. Acutely worsened upon presentation due to receiving 1L bolus in the ED and was briefly on IV fluids at 75 mL/h during admission.  Patient was diuresed with p.o. furosemide, still having significant peripheral edema on day of discharge.  Most recent weight 137.6 kg, increase of over 12 kg from 125.2 kg on original day of admission 7/28.  She was discharged with p.o. furosemide to take as needed for leg swelling along with KCl supplement.  Electrolyte abnormalities Patient with hypophosphatemia, hypomagnesemia, and hypokalemia throughout admission.  Was repleted with oral supplements, improved on day of discharge.  Mg 1.7, Phos 2.1, K 3.2.  Patient was continued on oral supplementation on discharge.  Other problems chronic and stable

## 2020-02-27 NOTE — Progress Notes (Signed)
At 1937 - patient's MEWS score became Red.  VS - T=102.0, 120/58, HR=113, RR=20, 96 RA.  Patient c/o abd pain 8/10 and BLE pain 8/10 (+3 pitting edema).  Wound Vac Cannister changed per patient's request with home supplies without difficulty.  150 cc serosanguinous drainage in cannister.  JP Drain charged and slight drainage in bulb.  Dr. Posey Pronto made aware and came to bedside to assess patient.  Multiple orders placed by MD and implemented including administering IV ABX, Tylenol PO for fever, and oxycodone IR for pain.  Red MEWS Protocol implemented per policy.  At 2159, patient remained RED MEWS but now she is tachypneic.  VS - T=102.4, 113/60, HR=110, RR=28, 92% RA.  Dr. Posey Pronto again notified.  Advised to place patient on Telemetry.  Also, continue to administer PRN Tylenol and pain medications.  Advised to notify is pain is uncontrolled or patient's condition worsens to notify them.  New RED MEWS Protocol implemented d/t patient becoming tachypneic.  Patient consistently rates pain as 8/10 or higher.  In between frequency of VS, patient is resting comfortably.  She was given oxycodone at 2046, 0033, and 0601.  Tylenol for fever was given at 2046 and 0231.  She refused Pure Elza Rafter and was assisted to [facility] per her request.  JP dressing and G Tube dressing was changed 0615.  Will continue to monitor patient.  Earleen Reaper RN

## 2020-02-27 NOTE — Progress Notes (Signed)
[facility] Progress Note     Subjective: Pain along left side. Drain still putting out purulent fluid. VAC had to be changed overnight but is not working correctly. Tolerating clears and having bowel function.   Objective: Vital signs in last 24 hours: Temp:  [99.1 F (37.3 C)-102.4 F (39.1 C)] 99.9 F (37.7 C) (08/15 0542) Pulse Rate:  [97-115] 97 (08/15 0542) Resp:  [19-28] 24 (08/15 0542) BP: (94-126)/(55-83) 123/67 (08/15 0542) SpO2:  [92 %-100 %] 97 % (08/15 0542) Weight:  [138.6 kg] 138.6 kg (08/14 1500) Last BM Date: 02/26/20  Intake/Output from previous day: 08/14 0701 - 08/15 0700 In: 3115.2 [P.O.:960; I.V.:1565.3; Blood:389; IV Piggyback:200.9] Out: 840 [Urine:650; Drains:190] Intake/Output this shift: No intake/output data recorded.  PE: Gen: Alert, NAD, pleasant Pulm: rate andeffort normal.  Abd: Soft,mild distension,tender over drain.VAC removed, granulation tissue present, scant drainage looks more like fibrinous exudate than wound infection.JP in place withlight brown drainage in bulb. G-tube clamped with bilious fluid in tubing Psych: A&Ox3  Skin: no rashes noted, warm and dry   Lab Results:  Recent Labs    02/25/20 1440 02/25/20 1440 02/26/20 0717 02/26/20 1825  WBC 14.9*  --  11.4*  --   HGB 7.6*   < > 6.8* 7.6*  HCT 26.4*   < > 23.5* 25.2*  PLT 274  --  275  --    < > = values in this interval not displayed.   BMET Recent Labs    02/25/20 1440 02/26/20 0717  NA 132* 133*  K 3.6 3.7  CL 103 105  CO2 21* 18*  GLUCOSE 119* 117*  BUN 6 6  CREATININE 0.60 0.64  CALCIUM 8.2* 8.3*   PT/INR Recent Labs    02/25/20 1440 02/26/20 0717  LABPROT 16.8* 17.0*  INR 1.4* 1.4*   CMP     Component Value Date/Time   NA 133 (L) 02/26/2020 0717   K 3.7 02/26/2020 0717   CL 105 02/26/2020 0717   CO2 18 (L) 02/26/2020 0717   GLUCOSE 117 (H) 02/26/2020 0717   BUN 6 02/26/2020 0717   CREATININE 0.64 02/26/2020 0717    CALCIUM 8.3 (L) 02/26/2020 0717   PROT 5.7 (L) 02/26/2020 0717   ALBUMIN 1.6 (L) 02/26/2020 0717   AST 15 02/26/2020 0717   ALT 10 02/26/2020 0717   ALKPHOS 219 (H) 02/26/2020 0717   BILITOT 1.0 02/26/2020 0717   GFRNONAA >60 02/26/2020 0717   GFRAA >60 02/26/2020 0717   Lipase     Component Value Date/Time   LIPASE 202 (H) 02/19/2020 0750       Studies/Results: DG Chest 2 View  Result Date: 02/25/2020 CLINICAL DATA:  Sepsis EXAM: CHEST - 2 VIEW COMPARISON:  02/21/2020 FINDINGS: Left pleural effusion with left lower lobe and atelectasis or infiltrate. Minimal right base density, likely atelectasis. Heart is normal size. No effusions. IMPRESSION: Small left pleural effusion with left lower lobe atelectasis or pneumonia. Electronically Signed   By: Rolm Baptise M.D.   On: 02/25/2020 18:08   CT ABDOMEN PELVIS W CONTRAST  Result Date: 02/25/2020 CLINICAL DATA:  Abdomen distension EXAM: CT ABDOMEN AND PELVIS WITH CONTRAST TECHNIQUE: Multidetector CT imaging of the abdomen and pelvis was performed using the standard protocol following bolus administration of intravenous contrast. CONTRAST:  140mL OMNIPAQUE IOHEXOL 300 MG/ML  SOLN COMPARISON:  CT 02/18/2020, 02/09/2020 FINDINGS: Lower chest: Lung bases demonstrate small left pleural effusion and left basilar consolidation which may be due to atelectasis or  pneumonia. This does not appear significantly changed since 02/18/2020. Stable cardiac size. Hepatobiliary: No focal hepatic abnormality. Status post cholecystectomy. No biliary dilatation. Pancreas: No ductal dilatation. Similar residual inflammatory changes in the left upper quadrant with multiple mildly rim enhancing fluid collections adjacent to the pancreatic tail and posterior to the left kidney presumably representing chronic pseudocysts. Spleen: Enlarged, measuring up to 16 cm in size. Adrenals/Urinary Tract: Adrenal glands are within normal limits. Kidneys show no hydronephrosis. The  bladder is normal Stomach/Bowel: Status post gastric bypass. Crescent shaped gas and fluid collection in the left upper quadrant adjacent to the spleen appears contiguous with the bypass stomach. Gastrostomy tube and balloon present within the bypassed stomach. Left upper quadrant rim enhancing gas and fluid collection further decreased in size, now measuring 4 x 2.8 cm, previously 6.8 x 4.2 cm. Mild fluid distension of left lower abdominal small bowel near the anastomosis. Remainder of the small bowel is nondistended. The appendix is normal. There is mild wall thickening of the splenic flexure. Vascular/Lymphatic: Nonaneurysmal aorta.  No suspicious nodes Reproductive: Fibroid uterus.  No suspicious adnexal mass. Other: No pelvic effusion. No free air. Increased bilateral flank edema. Generalized mesenteric inflammatory process in the left upper quadrant and mid abdomen without significant change. Surgical drain with tip terminating in the left upper quadrant adjacent to the gas and fluid collection. Musculoskeletal: No acute osseous abnormality. Stable scattered lucent lesions within the pelvic bones and vertebra. IMPRESSION: 1. Prior gastric bypass and placement of percutaneous gastrostomy tube within the bypassed stomach. Interval decrease in size of the previously noted left upper quadrant mesenteric gas and fluid collection, now measuring 4 cm maximum. No new or increasing fluid collections are visualized. 2. Residual inflammatory process within the left upper quadrant and mid abdominal region. Multiple irregular rim enhancing fluid collections adjacent to the pancreatic tail and posterior to the left kidney without significant change and thought to represent pseudocysts. 3. Fibroid uterus 4. Small left pleural effusion with partial consolidation in the left lower lobe, atelectasis versus pneumonia. 5. Enlarged spleen Electronically Signed   By: Donavan Foil M.D.   On: 02/25/2020 22:31     Anti-infectives: Anti-infectives (From admission, onward)   Start     Dose/Rate Route Frequency Ordered Stop   02/26/20 2245  ceFEPIme (MAXIPIME) 2 g in sodium chloride 0.9 % 100 mL IVPB     Discontinue     2 g 200 mL/hr over 30 Minutes Intravenous Every 8 hours 02/26/20 2233     02/26/20 2245  metroNIDAZOLE (FLAGYL) IVPB 500 mg     Discontinue     500 mg 100 mL/hr over 60 Minutes Intravenous Every 8 hours 02/26/20 2233     02/26/20 2200  cefTRIAXone (ROCEPHIN) 2 g in sodium chloride 0.9 % 100 mL IVPB  Status:  Discontinued        2 g 200 mL/hr over 30 Minutes Intravenous Every 24 hours 02/26/20 0032 02/26/20 2234   02/25/20 2100  piperacillin-tazobactam (ZOSYN) IVPB 2.25 g  Status:  Discontinued        2.25 g 100 mL/hr over 30 Minutes Intravenous  Once 02/25/20 2047 02/25/20 2052   02/25/20 2100  metroNIDAZOLE (FLAGYL) IVPB 500 mg        500 mg 100 mL/hr over 60 Minutes Intravenous  Once 02/25/20 2047 02/26/20 0128   02/25/20 2100  cefTRIAXone (ROCEPHIN) 1 g in sodium chloride 0.9 % 100 mL IVPB  Status:  Discontinued        1  g 200 mL/hr over 30 Minutes Intravenous  Once 02/25/20 2049 02/25/20 2052   02/25/20 2100  cefTRIAXone (ROCEPHIN) 2 g in sodium chloride 0.9 % 100 mL IVPB        2 g 200 mL/hr over 30 Minutes Intravenous  Once 02/25/20 2052 02/26/20 0128       Assessment/Plan Marginal ulcer -continuecarafate andprotonix crushed - not taken whole. ABL Anemia - s/p 1u PRBCs 8/9.s/p 1 unit PRBC 8/14. 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 Left abdominal wall hematoma/abscess with possible perforation. Serosal tear, Roux limb -S/p exp lap, lysis of adhesions >2hrs, drainage of intra-abdominal hematoma/abscess, insertion of G tube in remnant stomach, partial resection/reconstruction of roux limb 8/2 Dr [name] - POD #13 - CT 8/6 showed no leak and no free air,  surgical drain in appropriate location near L retroperitoneal fluid collection -CT 8/13 shows interval decrease in size of LUQ collection, enlarged spleen and pancreatic pseudocysts  -G-tube in remnant stomachclamped - can put to gravity to drain prn -repeat labs this AM - continue IV abx - I do not see any indication for emergent surgical intervention at this time - bari FLD, ensure - unsure why spleen is enlarged, likely reactive - JP draining purulent material, continue drain - VAC removed, BID WTD dressing for now   FEN -IVF, Bari FLD ID - cefepime/flagyl >> VTE prophylaxis - scds,lovenox Follow-Up - Dr. [name]  Plan:Continue ABX. Continue drain. WTD dressings  LOS: 1 day    [facility] 02/27/2020, 9:32 AM Please see Amion for pager number during day hours 7:00am-4:30pm

## 2020-02-27 NOTE — Progress Notes (Signed)
On call texted to notify that pt had 25cc of bloody, purulent drainage in JP drain. No complaint from patient. Awaiting response.

## 2020-02-27 NOTE — Evaluation (Signed)
Physical Therapy Evaluation Patient Details Name: Alexis Avila MRN: [id number] DOB: [birth year] Today's Date: 02/27/2020   History of Present Illness  The pt is a 39 yo female presenting with concern for infection with fever and L-sided pain. The pt was hospitalized from 7/28 - 8/11 due to SBO and perforation of SB loop, had laparotomy with G-tube on 02/14/20.   Clinical Impression  Pt presents to PT with deficits in functional mobility, gait, power, endurance. Pt with limited activity tolerance, noted to be tachycardic into 140s with ambulation this session. Pt also noted to have some drainage from drain site, RN made aware. Pt will benefit from continued acute PT POC to improve activity tolerance and to restore independence.    Follow Up Recommendations Outpatient PT    Equipment Recommendations  None recommended by PT    Recommendations for Other Services       Precautions / Restrictions Precautions Precautions: Fall Precaution Comments: tachycardia Restrictions Weight Bearing Restrictions: No      Mobility  Bed Mobility Overal bed mobility: Needs Assistance Bed Mobility: Supine to Sit     Supine to sit: Supervision        Transfers Overall transfer level: Needs assistance Equipment used: None Transfers: Sit to/from Stand Sit to Stand: Supervision            Ambulation/Gait Ambulation/Gait assistance: Supervision Gait Distance (Feet): 150 Feet Assistive device: None Gait Pattern/deviations: Wide base of support Gait velocity: reduced Gait velocity interpretation: <1.8 ft/sec, indicate of risk for recurrent falls General Gait Details: pt with increased lateral sway, reports she feels like she is walking like a Doctor, general practice [facility] (Stroke Patients Only)       Balance Overall balance assessment: Needs assistance Sitting-balance support: No upper extremity supported;Feet supported Sitting  balance-Leahy Scale: Good     Standing balance support: No upper extremity supported Standing balance-Leahy Scale: Good                               Pertinent Vitals/Pain Pain Assessment: Faces Faces Pain Scale: Hurts little more Pain Location: low back Pain Descriptors / Indicators: Aching Pain Intervention(s): Monitored during session    Home Living Family/patient expects to be discharged to:: Private residence Living Arrangements: Spouse/significant other Available Help at Discharge: Available PRN/intermittently;Other (Comment) Type of Home: Apartment Home Access: Level entry Entrance Stairs-Rails: None   Home Layout: One level Home Equipment: None      Prior Function Level of Independence: Independent               Hand Dominance   Dominant Hand: Right    Extremity/Trunk Assessment   Upper Extremity Assessment Upper Extremity Assessment: Overall WFL for tasks assessed    Lower Extremity Assessment Lower Extremity Assessment: Overall WFL for tasks assessed    Cervical / Trunk Assessment Cervical / Trunk Assessment: Normal  Communication   Communication: No difficulties  Cognition Arousal/Alertness: Awake/alert Behavior During Therapy: WFL for tasks assessed/performed Overall Cognitive Status: Within Functional Limits for tasks assessed                                        General Comments General comments (skin integrity, edema, etc.): pt tachy into 140s during session, requires 3 standing rest  breaks during ambulation    Exercises     Assessment/Plan    PT Assessment Patient needs continued PT services  PT Problem List Decreased activity tolerance;Decreased balance;Cardiopulmonary status limiting activity;Pain       PT Treatment Interventions Gait training;Functional mobility training;Therapeutic activities;Balance training;Patient/family education    PT Goals (Current goals can be found in the Care Plan  section)  Acute Rehab PT Goals Patient Stated Goal: to reduce pain and return to independence PT Goal Formulation: With patient Time For Goal Achievement: 03/12/20 Potential to Achieve Goals: Good Additional Goals Additional Goal #1: Pt will maintain dynamic standing balance within 6 inches of her base of support independently    Frequency Min 3X/week   Barriers to discharge        Co-evaluation               AM-PAC PT "6 Clicks" Mobility  Outcome Measure Help needed turning from your back to your side while in a flat bed without using bedrails?: None Help needed moving from lying on your back to sitting on the side of a flat bed without using bedrails?: None Help needed moving to and from a bed to a chair (including a wheelchair)?: None Help needed standing up from a chair using your arms (e.g., wheelchair or bedside chair)?: None Help needed to walk in hospital room?: None Help needed climbing 3-5 steps with a railing? : A Lot 6 Click Score: 22    End of Session   Activity Tolerance: Patient limited by fatigue Patient left: in bed;with call bell/phone within reach Nurse Communication: Mobility status PT Visit Diagnosis: Other abnormalities of gait and mobility (R26.89)    Time: 2482-5003 PT Time Calculation (min) (ACUTE ONLY): 21 min   Charges:   PT Evaluation $PT Eval Moderate Complexity: 1 Mod          Zenaida Niece, PT, DPT Acute Rehabilitation Pager: [phone]   Zenaida Niece 02/27/2020, 4:53 PM

## 2020-02-27 NOTE — Consult Note (Signed)
WOC Nurse Consult Note: Reason for Consult: VAC placement Wound type: Surgical Pressure Injury POA: NA  Patient known to our service. NPWT is discontinued today in favor of twice daily saline continually moist dressings.  We will await direction tomorrow regarding resumption of NPWT.  If patient to be discharged, recommend placement of NS dressing for discharge and for [facility] to place NPWT at home using home supplies.   Note:  Home supplies cannot be used in house (pump, canisters, dressings).  Patient's partner may take those home later today.  [facility] nursing team will remain available to this patient, the nursing and medical teams.  Please re-consult if needed. Thanks, Maudie Flakes, MSN, RN, [place], Arther Abbott  Pager# [phone]

## 2020-02-27 NOTE — Progress Notes (Signed)
Family Medicine Teaching Service Daily Progress Note Intern Pager: [phone]  Patient name: Alexis Avila Medical record number: [id number] Date of birth: [birth year] Age: 39 y.o. Gender: female  Primary Care Provider: [name], MD Consultants: Surgery, wound care. Code Status: Full  Pt Overview and Major Events to Date:  Alexis Avila is a 39 year old female presenting with fever.  PMH significant for GERD, pancreatitis, open retrocolic Roux-en-Y gastric bypass (2002),s/p ex lap for possible bowel perforation August 2021.  Assessment and Plan: Sepsis, resolved  left upper quadrant abdominal pain: no longer meeting sepsis criteria.  Vitals stable this morning, white blood cell count 7.6.  Last fevers up to 101.3 F at 2:00 this morning, since then has been afebrile.  Remains on cefepime and Flagyl.  Patient started having left-sided abdominal pain around the G-tube radiating to her back.  Only abnormal finding on CT is continued intra-abdominal inflammation as well as new splenomegaly (no findings on exam significant for mono). Per patient when wound care nurse removed sponge she was concerned for possible infection at the site.  Drains without evidence of infection.  -F/u Bcx x2 -consult surgery, appreciate recommendations -Bariatric diet -Continue IV cefepime, Flagyl -Continue OxyIR 5-10 q6 PRN, scheduled Tylenol, capsaicin, simethicone, topical Lidoderm patch -Continue to monitor mag, phosphate for refeeding syndrome -Discontinue fluids due to patient's lower extremity swelling  Tachypnea Patient has RR 16-24.  Patient does not endorse SOB or chest pain however she does endorse left side pain that starts at what appears to be right beneath her left lower lobe and radiates around to her G-tube.  CXR noted small left pleural effusion with left lower lobe atelectasis or pneumonia.  Atelectasis is more likely as CXR from 02/21/2020 noted poor inspiration with increased bibasilar segmental  atelectasis. -Continue IV cefepime, Flagyl -Cont monitoring pulse ox  Hyponatremia, improved Sodium 133 on admission, today 134 -Continue to monitor   Concern for refeeding syndrome: At last admission patient was having difficulty maintaining magnesium and phosphorus levels as she increased her ability to tolerate food.  On admission magnesium stable at 1.7, however phosphorus slightly low at 1.6 -Continue to monitor mag and phos levels -Continue daily multivitamin -K-Phos 1000 mg 3 times daily -Magnesium oxide tablet 400 mg daily -Continue feeding supplement (Ensure 3 times daily between meals)  Lower extremity edema Acutely worse possibly  due to receiving 1 L bolus in the ED, then placed on IV fluids (although at a low rate of 75 mL/h).  Swelling is symmetrical bilaterally, no redness to suggest DVTs. -Elevate legs while at rest -Stopping IV fluids -Asked nurse to wrap patient's lower extremities in Ace bandages  Marginal ulcer Patient home medications include Carafate and Protonix. -Continue home medications   Normocytic anemiahistory of B12 deficiency anemias/p gastric bypass Hemoglobin 7.6 > 7.3 today 8/15. -Continue to monitor -Transfusion threshold 7   FEN/GI: Bariatric diet PPx: Lovenox  Disposition: Discharge home once stable  Subjective:  Pleasant patient seen resting in bed this morning.  Reports abdominal pain, also reporting that she feels very distended in her lower extremities.  Objective: Temp:  [97.8 F (36.6 C)-102.4 F (39.1 C)] 97.8 F (36.6 C) (08/15 1400) Pulse Rate:  [84-115] 84 (08/15 1400) Resp:  [19-28] 19 (08/15 1313) BP: (106-123)/(52-67) 106/52 (08/15 1400) SpO2:  [92 %-100 %] 100 % (08/15 1400) Physical Exam: General: Pleasant patient, mild distress secondary to pain Cardiovascular: RRR, S1-S2 present, no murmurs appreciated Respiratory: CTA bilaterally, comfortable work of breathing, speaking complete sentences Abdomen:  Normal bowel  sounds appreciated, some guarding to palpation secondary to tenderness, wound VAC and G-tube remain in place without any evidence that I can appreciate of infection (however bandage remains in place) Extremities: 2+ pitting edema appreciated to bilateral lower extremities, no neurological deficits appreciated, sensation intact bilaterally  Laboratory: Recent Labs  Lab 02/25/20 1440 02/25/20 1440 02/26/20 0717 02/26/20 1825 02/27/20 1013  WBC 14.9*  --  11.4*  --  7.6  HGB 7.6*   < > 6.8* 7.6* 7.3*  HCT 26.4*   < > 23.5* 25.2* 24.5*  PLT 274  --  275  --  272   < > = values in this interval not displayed.   Recent Labs  Lab 02/23/20 0532 02/23/20 0532 02/25/20 1440 02/26/20 0717 02/27/20 1013  NA 133*   < > 132* 133* 134*  K 3.8   < > 3.6 3.7 3.3*  CL 103   < > 103 105 106  CO2 23   < > 21* 18* 20*  BUN <5*   < > 6 6 <5*  CREATININE 0.52   < > 0.60 0.64 0.51  CALCIUM 7.5*   < > 8.2* 8.3* 8.3*  PROT 5.5*  --  6.2* 5.7*  --   BILITOT 0.5  --  0.9 1.0  --   ALKPHOS 322*  --  284* 219*  --   ALT 13  --  12 10  --   AST 19  --  17 15  --   GLUCOSE 108*   < > 119* 117* 115*   < > = values in this interval not displayed.    Magnesium 1.7 Phosphorus 1.6  Imaging/Diagnostic Tests: DG Chest 2 View 02/25/2020 Small left pleural effusion with left lower lobe atelectasis or pneumonia.    CT ABDOMEN PELVIS W CONTRAST 02/25/2020 1. Prior gastric bypass and placement of percutaneous gastrostomy tube within the bypassed stomach. Interval decrease in size of the previously noted left upper quadrant mesenteric gas and fluid collection, now measuring 4 cm maximum. No new or increasing fluid collections are visualized.  2. Residual inflammatory process within the left upper quadrant and mid abdominal region. 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  3. Fibroid uterus  4. Small left pleural effusion with partial consolidation in the left lower lobe, atelectasis versus pneumonia.  5. Enlarged spleen    Daisy Floro, DO 02/27/2020, 3:39 PM PGY-3, [place] Intern pager: [phone], text pages welcome

## 2020-02-27 NOTE — Progress Notes (Signed)
OT Cancellation Note  Patient Details Name: Alexis Avila MRN: [id number] DOB: 11-27-1980   Cancelled Treatment:    Reason Eval/Treat Not Completed: Other (comment). Pt reports feeling drowsy from recent pain meds and removal of wound vac. Pt requests OT reattempt later this morning. OT to reattempt.  Tyrone Schimke, OT Acute Rehabilitation Services Pager: [phone] Office: [phone]  02/27/2020, 10:45 AM

## 2020-02-27 NOTE — Progress Notes (Signed)
Interim progress note  Called back RN at 8:17p about patient's JP drainage. Patient remains asymptomatic. Will continue to monitor

## 2020-02-28 DIAGNOSIS — R1084 Generalized abdominal pain: Secondary | ICD-10-CM

## 2020-02-28 DIAGNOSIS — R5082 Postprocedural fever: Secondary | ICD-10-CM

## 2020-02-28 LAB — CBC
HCT: 24.5 % — ABNORMAL LOW (ref 36.0–46.0)
Hemoglobin: 7.1 g/dL — ABNORMAL LOW (ref 12.0–15.0)
MCH: 25.9 pg — ABNORMAL LOW (ref 26.0–34.0)
MCHC: 29 g/dL — ABNORMAL LOW (ref 30.0–36.0)
MCV: 89.4 fL (ref 80.0–100.0)
Platelets: 299 10*3/uL (ref 150–400)
RBC: 2.74 MIL/uL — ABNORMAL LOW (ref 3.87–5.11)
RDW: 22.5 % — ABNORMAL HIGH (ref 11.5–15.5)
WBC: 7.2 10*3/uL (ref 4.0–10.5)
nRBC: 1.7 % — ABNORMAL HIGH (ref 0.0–0.2)

## 2020-02-28 LAB — BASIC METABOLIC PANEL
Anion gap: 7 (ref 5–15)
BUN: <5 mg/dL — ABNORMAL LOW (ref 6–20)
CO2: 22 mmol/L (ref 22–32)
Calcium: 8.2 mg/dL — ABNORMAL LOW (ref 8.9–10.3)
Chloride: 107 mmol/L (ref 98–111)
Creatinine, Ser: 0.57 mg/dL (ref 0.44–1.00)
GFR calc Af Amer: >60 mL/min (ref >60)
GFR calc non Af Amer: >60 mL/min (ref >60)
Glucose, Bld: 112 mg/dL — ABNORMAL HIGH (ref 70–99)
Potassium: 3.4 mmol/L — ABNORMAL LOW (ref 3.5–5.1)
Sodium: 136 mmol/L (ref 135–145)

## 2020-02-28 MED ORDER — OXYCODONE HCL 5 MG PO TABS
5.0000 mg | ORAL_TABLET | ORAL | Status: DC | PRN
  Administered 2020-02-28 – 2020-02-29 (×3): 10 mg via ORAL
  Filled 2020-02-28 (×3): qty 2

## 2020-02-28 MED ORDER — METRONIDAZOLE 500 MG PO TABS
500.0000 mg | ORAL_TABLET | Freq: Three times a day (TID) | ORAL | Status: DC
  Administered 2020-02-28 – 2020-02-29 (×3): 500 mg via ORAL
  Filled 2020-02-28 (×3): qty 1

## 2020-02-28 MED ORDER — CIPROFLOXACIN HCL 500 MG PO TABS
500.0000 mg | ORAL_TABLET | Freq: Two times a day (BID) | ORAL | Status: DC
  Administered 2020-02-28 – 2020-02-29 (×2): 500 mg via ORAL
  Filled 2020-02-28 (×2): qty 1

## 2020-02-28 MED ORDER — FUROSEMIDE 40 MG PO TABS
40.0000 mg | ORAL_TABLET | Freq: Every day | ORAL | Status: DC
  Administered 2020-02-28 – 2020-02-29 (×2): 40 mg via ORAL
  Filled 2020-02-28 (×2): qty 1

## 2020-02-28 MED ORDER — BOOST / RESOURCE BREEZE PO LIQD CUSTOM
1.0000 | Freq: Three times a day (TID) | ORAL | Status: DC
  Administered 2020-02-28 (×3): 1 via ORAL

## 2020-02-28 NOTE — Progress Notes (Signed)
OT Cancellation Note  Patient Details Name: Alexis Avila MRN: [id number] DOB: 1981-03-08   Cancelled Treatment:    Reason Eval/Treat Not Completed: Patient declined, no reason specified. Pt declining OOB activities due to not feeling like it and desire to go home today. Pt reporting she typically does not decline therapy. Educated that she declined OT eval yesterday and OT eval needed to ensure safe discharge plan. 15 min in room with pt agreeable at one point, then declined therapy ultimately. Unsure of pt functional abilities, will re-attempt OT eval as time allows and as pt agreeable.   Layla Maw 02/28/2020, 8:00 AM

## 2020-02-28 NOTE — Progress Notes (Signed)
Family Medicine Teaching Service Daily Progress Note Intern Pager: [phone]  Patient name: Alexis Avila Medical record number: [id number] Date of birth: [birth year] Age: 39 y.o. Gender: female  Primary Care Provider: [name], MD Consultants: Surgery, wound care   Code Status: Full Code  Pt Overview and Major Events to Date:  8/2 Ex-lap (prior admission) 8/14 Admitted  Assessment and Plan: Alexis Avila is a 39 y.o. female who presents with fever. PMHx significant for GERD, pancreatitis, open retrocolic Roux-en-Y gastric bypass (2002), s/p ex lap for possible bowel perforation 02/14/20.  Sepsis Patient was recently discharged s/p ex-lap for possible bowel perforation on 8/2 after receiving a full course of antibiotics, discharged on no antibiotics.   Initially presented with fever in the absence of other symptoms meeting sepsis criteria with tachycardia, and leukocytosis.  Now no longer meeting sepsis criteria with last fever around 2:00 AM on 8/15.  Has been on empiric broad-spectrum antibiotics (cefepime, metronidazole).  CT abdomen with findings of continued intra-abdominal splenomegaly. General surgery following, appreciate involvement.  Now afebrile for over 24 hours, can likely transition to p.o. antibiotics to treat intra-abdominal infection with organism unknown, planning for 7-day course.  Can likely discharge home if remaining stable on p.o. antibiotics.  Blood cultures no growth to date at 3 days. Abdominal pain not well controlled this morning, as needed oxycodone was increased by surgery team. - f/u surgery recs - f/u blood cx - PO metronidazole (8/16-) - PO ciprofloxacin (8/16-) - pain control: oxycodone prn, Tylenol sch, lidocaine patch, simethicone   Atelectasis / Tachypnea Atelectasis noted from imaging from recent admission.  Patient has been intermittently tachypneic likely secondary to left-sided abdominal pain. No SOB this morning per patient. - continuous SpO2  monitoring  Lower extremity edema Likely due to iatrogenic fluid overload from recent admission, patient was still having lower extremity edema upon discharge.  Acutely worsened upon presentation due to receiving 1L bolus in the ED and was briefly on IV fluids at 75 mL/h during admission. 3+ edema on exam this morning. 125.2 kg -> 138.6 kg since date of recent admission 7/28, so she has significant volume to be diuresed.  Despite electrolyte abnormalities, we will proceed with diuresis today. - PO furosemide 40 mg  Hyponatremia Resolved.  Concern for refeeding syndrome Given recent surgery with Roux-en-Y revision. - monitor Mg, Phos - replete with KPhos tablet and magnesium oxide tablet - continue feeding supplement  Marginal ulcer - continue home sucralfate and pantoprazole  Normocytic anemia With history of B12 deficiency anemia s/p gastric bypass. Mild Hgb drop today, but still above transfusion threshold. - transfusion threshold Hgb > 7  FEN/GI: bariatric diet PPx: enoxaparin  Disposition: med-tele, possible dc in 1-2 days  Subjective:  Overnight had bloody, purulent drainage from JP drain, remained asymptomatic.  This morning, patient still reporting abdominal pain, primarily LUQ radiating around to the back.  Patient states she has significant leg swelling which had been stable at home, but worsened when she came to the hospital.  She reports having some tightness in her legs as result, which is causing pain.  Objective: Temp:  [97.8 F (36.6 C)-100.2 F (37.9 C)] 99 F (37.2 C) (08/16 0347) Pulse Rate:  [84-107] 103 (08/16 0347) Resp:  [18-24] 18 (08/16 0002) BP: (102-111)/(50-70) 103/62 (08/16 0347) SpO2:  [92 %-100 %] 97 % (08/16 0347) Physical Exam: General: Morbidly obese female sitting up in bed, mildly uncomfortable, NAD Cardiovascular: RRR, no murmurs Respiratory: Faint bibasilar rales, otherwise clear, no respiratory distress Abdomen:  Diffuse tenderness but  primarily surrounding wound sites, +BS, bandages in place, no wound VAC currently, JP drain with moderate brown drainage, G-tube clamped Extremities: WWP, 3+ pitting edema bilaterally  Laboratory: Recent Labs  Lab 02/26/20 0717 02/26/20 0717 02/26/20 1825 02/27/20 1013 02/28/20 0326  WBC 11.4*  --   --  7.6 7.2  HGB 6.8*   < > 7.6* 7.3* 7.1*  HCT 23.5*   < > 25.2* 24.5* 24.5*  PLT 275  --   --  272 299   < > = values in this interval not displayed.   Recent Labs  Lab 02/23/20 0532 02/23/20 0532 02/25/20 1440 02/25/20 1440 02/26/20 0717 02/27/20 1013 02/28/20 0326  NA 133*   < > 132*   < > 133* 134* 136  K 3.8   < > 3.6   < > 3.7 3.3* 3.4*  CL 103   < > 103   < > 105 106 107  CO2 23   < > 21*   < > 18* 20* 22  BUN <5*   < > 6   < > 6 <5* <5*  CREATININE 0.52   < > 0.60   < > 0.64 0.51 0.57  CALCIUM 7.5*   < > 8.2*   < > 8.3* 8.3* 8.2*  PROT 5.5*  --  6.2*  --  5.7*  --   --   BILITOT 0.5  --  0.9  --  1.0  --   --   ALKPHOS 322*  --  284*  --  219*  --   --   ALT 13  --  12  --  10  --   --   AST 19  --  17  --  15  --   --   GLUCOSE 108*   < > 119*   < > 117* 115* 112*   < > = values in this interval not displayed.    Imaging/Diagnostic Tests: No new imaging  Zola Button, MD 02/28/2020, 8:02 AM PGY-1, [place] Intern pager: [phone], text pages welcome

## 2020-02-28 NOTE — Plan of Care (Signed)
  Problem: Clinical Measurements: Goal: Diagnostic test results will improve Outcome: Completed/Met Goal: Cardiovascular complication will be avoided Outcome: Completed/Met

## 2020-02-28 NOTE — Plan of Care (Signed)
  Problem: Health Behavior/Discharge Planning: Goal: Ability to manage health-related needs will improve Outcome: Progressing   

## 2020-02-28 NOTE — Progress Notes (Signed)
[facility] Progress Note     Subjective: Reports pain control could be better.  Tolerating bariatric full liquid diet.  Objective: Vital signs in last 24 hours: Temp:  [97.8 F (36.6 C)-100.2 F (37.9 C)] 99 F (37.2 C) (08/16 0347) Pulse Rate:  [84-107] 103 (08/16 0347) Resp:  [18-24] 18 (08/16 0002) BP: (102-111)/(50-70) 103/62 (08/16 0347) SpO2:  [92 %-100 %] 97 % (08/16 0347) Last BM Date: 02/27/20  Intake/Output from previous day: 08/15 0701 - 08/16 0700 In: 2400 [P.O.:1800; IV Piggyback:600] Out: 676 [Urine:650; Drains:25; Stool:1] Intake/Output this shift: No intake/output data recorded.  PE: Gen: Alert, NAD, pleasant Pulm: rate andeffort normal.  Abd: Soft,mild distension,tender over drain. Midline wound granulating well without significant drainage or evidence of infection.JP in place withred-tinged brown drainage. G-tube clamped with bilious fluid in tubing Psych: A&Ox3  Skin: no rashes noted, warm and dry   Lab Results:  Recent Labs    02/27/20 1013 02/28/20 0326  WBC 7.6 7.2  HGB 7.3* 7.1*  HCT 24.5* 24.5*  PLT 272 299   BMET Recent Labs    02/27/20 1013 02/28/20 0326  NA 134* 136  K 3.3* 3.4*  CL 106 107  CO2 20* 22  GLUCOSE 115* 112*  BUN <5* <5*  CREATININE 0.51 0.57  CALCIUM 8.3* 8.2*   PT/INR Recent Labs    02/25/20 1440 02/26/20 0717  LABPROT 16.8* 17.0*  INR 1.4* 1.4*   CMP     Component Value Date/Time   NA 136 02/28/2020 0326   K 3.4 (L) 02/28/2020 0326   CL 107 02/28/2020 0326   CO2 22 02/28/2020 0326   GLUCOSE 112 (H) 02/28/2020 0326   BUN <5 (L) 02/28/2020 0326   CREATININE 0.57 02/28/2020 0326   CALCIUM 8.2 (L) 02/28/2020 0326   PROT 5.7 (L) 02/26/2020 0717   ALBUMIN 1.6 (L) 02/26/2020 0717   AST 15 02/26/2020 0717   ALT 10 02/26/2020 0717   ALKPHOS 219 (H) 02/26/2020 0717   BILITOT 1.0 02/26/2020 0717   GFRNONAA >60 02/28/2020 0326   GFRAA >60 02/28/2020 0326   Lipase     Component  Value Date/Time   LIPASE 202 (H) 02/19/2020 0750       Studies/Results: No results found.  Anti-infectives: Anti-infectives (From admission, onward)   Start     Dose/Rate Route Frequency Ordered Stop   02/26/20 2245  ceFEPIme (MAXIPIME) 2 g in sodium chloride 0.9 % 100 mL IVPB     Discontinue     2 g 200 mL/hr over 30 Minutes Intravenous Every 8 hours 02/26/20 2233     02/26/20 2245  metroNIDAZOLE (FLAGYL) IVPB 500 mg     Discontinue     500 mg 100 mL/hr over 60 Minutes Intravenous Every 8 hours 02/26/20 2233     02/26/20 2200  cefTRIAXone (ROCEPHIN) 2 g in sodium chloride 0.9 % 100 mL IVPB  Status:  Discontinued        2 g 200 mL/hr over 30 Minutes Intravenous Every 24 hours 02/26/20 0032 02/26/20 2234   02/25/20 2100  piperacillin-tazobactam (ZOSYN) IVPB 2.25 g  Status:  Discontinued        2.25 g 100 mL/hr over 30 Minutes Intravenous  Once 02/25/20 2047 02/25/20 2052   02/25/20 2100  metroNIDAZOLE (FLAGYL) IVPB 500 mg        500 mg 100 mL/hr over 60 Minutes Intravenous  Once 02/25/20 2047 02/26/20 0128   02/25/20 2100  cefTRIAXone (ROCEPHIN) 1 g in sodium  chloride 0.9 % 100 mL IVPB  Status:  Discontinued        1 g 200 mL/hr over 30 Minutes Intravenous  Once 02/25/20 2049 02/25/20 2052   02/25/20 2100  cefTRIAXone (ROCEPHIN) 2 g in sodium chloride 0.9 % 100 mL IVPB        2 g 200 mL/hr over 30 Minutes Intravenous  Once 02/25/20 2052 02/26/20 0128       Assessment/Plan Marginal ulcer -continuecarafate andprotonix crushed - not taken whole. ABL Anemia - s/p 1u PRBCs 8/9.s/p 1 unit PRBC 8/14. 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 Left abdominal wall hematoma/abscess with possible perforation. Serosal tear, Roux limb -S/p exp lap, lysis of adhesions >2hrs, drainage of intra-abdominal hematoma/abscess, insertion of G tube in remnant stomach,  partial resection/reconstruction of roux limb 8/2 Dr [name] - POD #14 - CT 8/6 showed no leak and no free air, surgical drain in appropriate location near L retroperitoneal fluid collection -CT 8/13 shows interval decrease in size of LUQ collection, enlarged spleen and pancreatic pseudocysts  -G-tube in remnant stomachclamped - can put to gravity to drain prn - bari FLD, ensure - JP draining purulent material, continue drain - VAC removed, BID WTD dressing for now-okay to replace VAC if it functions well  FEN -IVF, Bari FLD ID - cefepime/flagyl >> VTE prophylaxis - scds,lovenox Follow-Up - Dr. [name]  Plan:Okay to resume wound VAC, if it malfunctions consistently would discontinue wet-to-dry dressings.  Continue drain to bulb suction, continue bariatric full liquid diet as tolerated.  Consider transition to oral antibiotics to transition home  LOS: 2 days    Alexis Avila , [facility] 02/28/2020, 8:41 AM Please see Amion for pager number during day hours 7:00am-4:30pm

## 2020-02-28 NOTE — Telephone Encounter (Signed)
Faxed to provided number. Copy made and placed in batch scanning.  Talbot Grumbling, RN

## 2020-02-29 LAB — BASIC METABOLIC PANEL
Anion gap: 8 (ref 5–15)
BUN: <5 mg/dL — ABNORMAL LOW (ref 6–20)
CO2: 23 mmol/L (ref 22–32)
Calcium: 8.2 mg/dL — ABNORMAL LOW (ref 8.9–10.3)
Chloride: 106 mmol/L (ref 98–111)
Creatinine, Ser: 0.61 mg/dL (ref 0.44–1.00)
GFR calc Af Amer: >60 mL/min (ref >60)
GFR calc non Af Amer: >60 mL/min (ref >60)
Glucose, Bld: 108 mg/dL — ABNORMAL HIGH (ref 70–99)
Potassium: 3.2 mmol/L — ABNORMAL LOW (ref 3.5–5.1)
Sodium: 137 mmol/L (ref 135–145)

## 2020-02-29 LAB — MAGNESIUM: Magnesium: 1.7 mg/dL (ref 1.7–2.4)

## 2020-02-29 LAB — CBC
HCT: 25.1 % — ABNORMAL LOW (ref 36.0–46.0)
Hemoglobin: 7.4 g/dL — ABNORMAL LOW (ref 12.0–15.0)
MCH: 26.8 pg (ref 26.0–34.0)
MCHC: 29.5 g/dL — ABNORMAL LOW (ref 30.0–36.0)
MCV: 90.9 fL (ref 80.0–100.0)
Platelets: 318 10*3/uL (ref 150–400)
RBC: 2.76 MIL/uL — ABNORMAL LOW (ref 3.87–5.11)
RDW: 22.7 % — ABNORMAL HIGH (ref 11.5–15.5)
WBC: 6.4 10*3/uL (ref 4.0–10.5)
nRBC: 1.2 % — ABNORMAL HIGH (ref 0.0–0.2)

## 2020-02-29 LAB — PHOSPHORUS: Phosphorus: 2.1 mg/dL — ABNORMAL LOW (ref 2.5–4.6)

## 2020-02-29 MED ORDER — CIPROFLOXACIN HCL 500 MG PO TABS: 500.0000 mg | ORAL_TABLET | Freq: Two times a day (BID) | ORAL | 0 refills | Status: AC

## 2020-02-29 MED ORDER — FUROSEMIDE 20 MG PO TABS: 20.0000 mg | ORAL_TABLET | Freq: Every day | ORAL | 0 refills | Status: DC | PRN

## 2020-02-29 MED ORDER — POTASSIUM CHLORIDE 20 MEQ PO PACK
20.0000 meq | PACK | Freq: Once | ORAL | Status: DC
  Filled 2020-02-29: qty 1

## 2020-02-29 MED ORDER — METRONIDAZOLE 500 MG PO TABS: 500.0000 mg | ORAL_TABLET | Freq: Three times a day (TID) | ORAL | 0 refills | Status: AC

## 2020-02-29 MED ORDER — POTASSIUM CHLORIDE ER 20 MEQ PO TBCR: 20.0000 meq | EXTENDED_RELEASE_TABLET | Freq: Every day | ORAL | 0 refills | Status: DC | PRN

## 2020-02-29 NOTE — Progress Notes (Signed)
Patient up watching tv most of night. Needs encouragement taking her medications.

## 2020-02-29 NOTE — Progress Notes (Signed)
OT Cancellation Note  Patient Details Name: Alexis Avila MRN: [id number] DOB: 31-Mar-1981   Cancelled Treatment:      Pt. Refused OT she is being dc today.  Candis Kabel 02/29/2020, 10:27 AM

## 2020-02-29 NOTE — Discharge Instructions (Signed)
Dear Alexis Avila,   Thank you so much for allowing [place] to be part of your care!  You were admitted to [facility] for fever after you were discharged home on your previous surgery.  You had a CT scan of your abdomen, which did not show any obvious source of infection.  You were initially treated with IV antibiotics, then transitioned to oral antibiotics.  We also gave you Lasix to help with your leg swelling.  You were continued on magnesium and phosphorus supplements in the hospital.  You did not have any continued fever while you are on oral antibiotics, so we felt it was safe to discharge her home.   POST-HOSPITAL & CARE INSTRUCTIONS 1. Continue wound care at home with the wound care nurse. You are fine to resume the wound VAC or continue on wet-to-dry dressings. 2. We will send you home with Lasix for your leg swelling. You can take one tablet a day as needed for leg swelling. We will also start a potassium supplement; please take this when you take your Lasix. 3. Please continue your antibiotics as prescribed. 4. Continue taking the nutritional supplements. 5. We have made you an appointment on Monday, 8/23 to have your BMP, magnesium, and phosphorus checked. 6. Please let PCP/Specialists know of any changes that were made.  7. Please see medications section of this packet for any medication changes.   DOCTOR'S APPOINTMENT & FOLLOW UP CARE INSTRUCTIONS  Future Appointments  Date Time Provider [place]  03/06/2020 10:10 AM ACCESS TO CARE POOL [facility] [place]    RETURN PRECAUTIONS: Please call if you develop fever or severe worsening of your pain.  Take care and be well!  [facility]  [place], [place] [address] [phone]

## 2020-02-29 NOTE — Progress Notes (Signed)
PT Cancellation Note  Patient Details Name: Alexis Avila MRN: [id number] DOB: 1980/11/15   Cancelled Treatment:    Reason Eval/Treat Not Completed: Other (comment).  Pt is leaving and politely declined PT.  Will re-attempt tomorrow if dc is held.   Ramond Dial 02/29/2020, 11:19 AM   Mee Hives, PT MS Acute Rehab Dept. Number: [place]

## 2020-02-29 NOTE — Progress Notes (Signed)
[facility] Progress Note     Subjective: No complaints today.  Tolerating bariatric full liquid diet.  Objective: Vital signs in last 24 hours: Temp:  [98.2 F (36.8 C)-99.6 F (37.6 C)] 99.1 F (37.3 C) (08/17 0453) Pulse Rate:  [96-101] 99 (08/17 0453) Resp:  [17-20] 18 (08/17 0453) BP: (93-110)/(53-63) 93/53 (08/17 0453) SpO2:  [95 %-100 %] 95 % (08/17 0453) Weight:  [137.6 kg] 137.6 kg (08/16 2020) Last BM Date: 02/28/20  Intake/Output from previous day: 08/16 0701 - 08/17 0700 In: 1080 [P.O.:1080] Out: 50 [Drains:50] Intake/Output this shift: No intake/output data recorded.  PE: Gen: Alert, NAD, pleasant Pulm: rate andeffort normal.  Abd: Soft, approriately tender.JP in place withpurulent drainage. G-tube clamped with bilious fluid in tubing Psych: A&Ox3  Skin: no rashes noted, warm and dry   Lab Results:  Recent Labs    02/28/20 0326 02/29/20 0634  WBC 7.2 6.4  HGB 7.1* 7.4*  HCT 24.5* 25.1*  PLT 299 318   BMET Recent Labs    02/27/20 1013 02/28/20 0326  NA 134* 136  K 3.3* 3.4*  CL 106 107  CO2 20* 22  GLUCOSE 115* 112*  BUN <5* <5*  CREATININE 0.51 0.57  CALCIUM 8.3* 8.2*   PT/INR No results for input(s): LABPROT, INR in the last 72 hours. CMP     Component Value Date/Time   NA 136 02/28/2020 0326   K 3.4 (L) 02/28/2020 0326   CL 107 02/28/2020 0326   CO2 22 02/28/2020 0326   GLUCOSE 112 (H) 02/28/2020 0326   BUN <5 (L) 02/28/2020 0326   CREATININE 0.57 02/28/2020 0326   CALCIUM 8.2 (L) 02/28/2020 0326   PROT 5.7 (L) 02/26/2020 0717   ALBUMIN 1.6 (L) 02/26/2020 0717   AST 15 02/26/2020 0717   ALT 10 02/26/2020 0717   ALKPHOS 219 (H) 02/26/2020 0717   BILITOT 1.0 02/26/2020 0717   GFRNONAA >60 02/28/2020 0326   GFRAA >60 02/28/2020 0326   Lipase     Component Value Date/Time   LIPASE 202 (H) 02/19/2020 0750       Studies/Results: No results found.  Anti-infectives: Anti-infectives (From admission,  onward)   Start     Dose/Rate Route Frequency Ordered Stop   02/28/20 2000  ciprofloxacin (CIPRO) tablet 500 mg     Discontinue     500 mg Oral 2 times daily 02/28/20 1136 03/04/20 0759   02/28/20 1400  metroNIDAZOLE (FLAGYL) tablet 500 mg     Discontinue     500 mg Oral Every 8 hours 02/28/20 1136 03/04/20 0559   02/26/20 2245  ceFEPIme (MAXIPIME) 2 g in sodium chloride 0.9 % 100 mL IVPB  Status:  Discontinued        2 g 200 mL/hr over 30 Minutes Intravenous Every 8 hours 02/26/20 2233 02/28/20 1147   02/26/20 2245  metroNIDAZOLE (FLAGYL) IVPB 500 mg  Status:  Discontinued        500 mg 100 mL/hr over 60 Minutes Intravenous Every 8 hours 02/26/20 2233 02/28/20 1136   02/26/20 2200  cefTRIAXone (ROCEPHIN) 2 g in sodium chloride 0.9 % 100 mL IVPB  Status:  Discontinued        2 g 200 mL/hr over 30 Minutes Intravenous Every 24 hours 02/26/20 0032 02/26/20 2234   02/25/20 2100  piperacillin-tazobactam (ZOSYN) IVPB 2.25 g  Status:  Discontinued        2.25 g 100 mL/hr over 30 Minutes Intravenous  Once 02/25/20 2047 02/25/20 2052  02/25/20 2100  metroNIDAZOLE (FLAGYL) IVPB 500 mg        500 mg 100 mL/hr over 60 Minutes Intravenous  Once 02/25/20 2047 02/26/20 0128   02/25/20 2100  cefTRIAXone (ROCEPHIN) 1 g in sodium chloride 0.9 % 100 mL IVPB  Status:  Discontinued        1 g 200 mL/hr over 30 Minutes Intravenous  Once 02/25/20 2049 02/25/20 2052   02/25/20 2100  cefTRIAXone (ROCEPHIN) 2 g in sodium chloride 0.9 % 100 mL IVPB        2 g 200 mL/hr over 30 Minutes Intravenous  Once 02/25/20 2052 02/26/20 0128       Assessment/Plan Marginal ulcer -continuecarafate andprotonix crushed - not taken whole. ABL Anemia - s/p 1u PRBCs 8/9.s/p 1 unit PRBC 8/14. 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 Left abdominal wall hematoma/abscess with possible  perforation. Serosal tear, Roux limb -S/p exp lap, lysis of adhesions >2hrs, drainage of intra-abdominal hematoma/abscess, insertion of G tube in remnant stomach, partial resection/reconstruction of roux limb 8/2 Dr [name] - POD #15 - CT 8/6 showed no leak and no free air, surgical drain in appropriate location near L retroperitoneal fluid collection -CT 8/13 shows interval decrease in size of LUQ collection, enlarged spleen and pancreatic pseudocysts  -G-tube in remnant stomachclamped - can put to gravity to drain prn - bari FLD, ensure - JP draining purulent material, continue drain - VAC removed, BID WTD dressing for now-okay to replace VAC if it functions well  FEN -IVF, Bari FLD ID - cefepime/flagyl >>8/16; cipro/flagyl 8/16>> VTE prophylaxis - scds,lovenox Follow-Up - Dr. [name]  Plan:Okay to resume wound VAC, if it malfunctions consistently would just continue wet-to-dry dressings.  Continue drain to bulb suction, continue bariatric full liquid diet as tolerated.  Continue PO abx. OK for discharge from surgery standpoint.    LOS: 3 days    Clovis Riley , [facility] 02/29/2020, 8:17 AM Please see Amion for pager number during day hours 7:00am-4:30pm

## 2020-02-29 NOTE — Discharge Summary (Addendum)
[facility] Discharge Summary  Patient name: Alexis Avila Medical record number: [id number] Date of birth: Nov 25, 1980 Age: 39 y.o. Gender: female Date of Admission: 02/25/2020  Date of Discharge: 02/29/2020 Admitting Physician: Freida Busman, MD  Primary Care Provider: [name], MD Consultants: General surgery, wound care  Indication for Hospitalization: Sepsis  Discharge Diagnoses/Problem List:  S/p ex-lap Sepsis Splenomegaly Lower extremity edema Atelectasis Morbid obesity Concern for refeeding syndrome Marginal ulcer Normocytic anemia  Disposition: home  Discharge Condition: Stable  Discharge Exam:  General: Morbidly obese female sitting up in bed, mildly uncomfortable, NAD Cardiovascular: RRR, no murmurs Respiratory: CTAB, no respiratory distress Abdomen: Soft, appropriately tender, +BS, dressings in place, no wound VAC currently, JP drain with small amount of brown drainage, G-tube clamped Extremities: WWP, 2+ pitting edema bilaterally  Brief Hospital Course:  Alexis Avila is a 39 y.o. female with a history of recent ex-lap for possible bowel perforation 02/14/20, morbid obesity, GERD, open retrocolic Roux-en-Y gastric bypass (2002), pancreatitis who presented with fever. Hospital course outlined by problem below:  Sepsis Patient was recently discharged on 8/11 s/p ex-lap for possible bowel perforation on 8/2 after receiving a full course of antibiotics and was discharged without antibiotics.  Patient represented with fever in the absence of other symptoms, initially meeting sepsis criteria with tachycardia and leukocytosis.  She was started on empiric broad-spectrum IV antibiotics (cefepime, metronidazole).  CT abdomen with findings of continued intra-abdominal inflammation and splenomegaly, felt to be reactive.  After she remained afebrile for over 24 hours, she was transitioned to oral antibiotics.  She remained afebrile for another 24  hours on oral antibiotics, so she was discharged home with plan for 7-day total course of antibiotics and for home RN wound care. Antibiotics: CTX (8/13), cefepime (8/14-8/16), IV metronidazole (8/13-8/16), PO metronidazole (8/16-8/21), PO clindamycin (8/16-8/21)  Lower extremity edema Patient presented with significant 3+ lower extremity edema bilaterally, likely due to iatrogenic fluid overload from recent admission. Acutely worsened upon presentation due to receiving 1L bolus in the ED and was briefly on IV fluids at 75 mL/h during admission.  Patient was diuresed with p.o. furosemide, still having significant peripheral edema on day of discharge.  Most recent weight 137.6 kg, increase of over 12 kg from 125.2 kg on original day of admission 7/28.  She was discharged with p.o. furosemide to take as needed for leg swelling along with KCl supplement.  Electrolyte abnormalities Patient with hypophosphatemia, hypomagnesemia, and hypokalemia throughout admission.  Was repleted with oral supplements, improved on day of discharge. Mg 1.7, Phos 2.1, K 3.2.  Patient was continued on oral supplementation on discharge.  Other problems chronic and stable  Issues for Follow Up:  Check BMP, Mg, Phos Follow-up lower extremity edema, given 7 doses of furosemide prn  Significant Procedures: none  Significant Labs and Imaging:  Recent Labs  Lab 02/27/20 1013 02/28/20 0326 02/29/20 0634  WBC 7.6 7.2 6.4  HGB 7.3* 7.1* 7.4*  HCT 24.5* 24.5* 25.1*  PLT 272 299 318   Recent Labs  Lab 02/23/20 0532 02/23/20 0532 02/25/20 1440 02/25/20 1440 02/26/20 0717 02/26/20 0717 02/27/20 1013 02/27/20 1013 02/28/20 0326 02/29/20 0634  NA 133*   < > 132*  --  133*  --  134*  --  136 137  K 3.8   < > 3.6   < > 3.7   < > 3.3*   < > 3.4* 3.2*  CL 103   < > 103  --  105  --  106  --  107 106  CO2 23   < > 21*  --  18*  --  20*  --  22 23  GLUCOSE 108*   < > 119*  --  117*  --  115*  --  112* 108*  BUN <5*    < > 6  --  6  --  <5*  --  <5* <5*  CREATININE 0.52   < > 0.60  --  0.64  --  0.51  --  0.57 0.61  CALCIUM 7.5*   < > 8.2*  --  8.3*  --  8.3*  --  8.2* 8.2*  MG 1.8  --   --   --  1.7  --  1.6*  --   --  1.7  PHOS 1.6*  --   --   --  1.6*  --  1.5*  --   --  2.1*  ALKPHOS 322*  --  284*  --  219*  --   --   --   --   --   AST 19  --  17  --  15  --   --   --   --   --   ALT 13  --  12  --  10  --   --   --   --   --   ALBUMIN 1.7*  --  1.8*  --  1.6*  --   --   --   --   --    < > = values in this interval not displayed.     Results/Tests Pending at Time of Discharge: none  Discharge Medications:  Allergies as of 02/29/2020       Reactions   Nsaids Other (See Comments)   H/o gastric bypass, h/o ulcers   Lactose Intolerance (gi) Other (See Comments)   Penicillins Hives   Tolerated ceftriaxone 02/2020        Medication List     TAKE these medications    acetaminophen 160 MG/5ML solution Commonly known as: TYLENOL Take 31.3 mLs (1,000 mg total) by mouth every 8 (eight) hours.   ciprofloxacin 500 MG tablet Commonly known as: CIPRO Take 1 tablet (500 mg total) by mouth 2 (two) times daily for 7 doses.   furosemide 20 MG tablet Commonly known as: LASIX Take 1 tablet (20 mg total) by mouth daily as needed for up to 7 doses for fluid or edema.   magnesium gluconate 30 MG tablet Commonly known as: MAGONATE Take 1 tablet (30 mg total) by mouth 2 (two) times daily.   metroNIDAZOLE 500 MG tablet Commonly known as: FLAGYL Take 1 tablet (500 mg total) by mouth every 8 (eight) hours for 11 doses.   multivitamin with minerals tablet Take 1 tablet by mouth daily.   ondansetron 4 MG disintegrating tablet Commonly known as: Zofran ODT Take 1 tablet (4 mg total) by mouth every 8 (eight) hours as needed for nausea or vomiting.   oxyCODONE 5 MG immediate release tablet Commonly known as: Oxy IR/ROXICODONE Take 1-2 tablets (5-10 mg total) by mouth every 6 (six) hours as needed  for up to 7 days for moderate pain (5mg  for moderate pain).   pantoprazole sodium 40 mg/20 mL Pack Commonly known as: PROTONIX Take 20 mLs (40 mg total) by mouth 2 (two) times daily.   Potassium Chloride ER 20 MEQ Tbcr Take 20 mEq by mouth daily as needed for up to 7 doses.  Take this whenever you take Lasix.   potassium phosphate (monobasic) 500 MG tablet Commonly known as: K-PHOS ORIGINAL Take 2 tablets (1,000 mg total) by mouth 3 (three) times daily with meals.   sucralfate 1 GM/10ML suspension Commonly known as: CARAFATE Take 10 mLs (1 g total) by mouth 4 (four) times daily -  with meals and at bedtime.               Discharge Care Instructions  (From admission, onward)           Start     Ordered   02/29/20 0000  Discharge wound care:       Comments: Continue per home health RN wound care.   02/29/20 1405            Discharge Instructions: Please refer to Patient Instructions section of EMR for full details.  Patient was counseled important signs and symptoms that should prompt return to medical care, changes in medications, dietary instructions, activity restrictions, and follow up appointments.   Follow-Up Appointments:  Follow-up Information     Care, [facility] Follow up.   Specialty: Home Health Services Why: home health nurse to follow up on Wednesday Contact information: [address] [place] [address] [phone]         [facility]. Go on 03/06/2020.   Why: 10:10am. Contact information: [place]                Zola Button, MD 02/29/2020, 5:27 PM PGY-1, [place] Upper-Level Resident Addendum  I have independently interviewed and examined the patient. I have discussed the above with the original author and agree with their documentation. My edits for correction/addition/clarification are in italics. Please  see also any attending notes.    Milus Banister, DO PGY-3, [facility] 03/06/2020 8:19 AM  FPTS Service pager: [phone] (text pages welcome through [facility])

## 2020-02-29 NOTE — TOC Initial Note (Signed)
Transition of Care [facility]) - Initial/Assessment Note    Patient Details  Name: Alexis Avila MRN: [id number] Date of Birth: [birth year]  [facility] CM/SW Contact:    Bartholomew Crews, RN Phone Number: [phone] 02/29/2020, 1:03 PM  Clinical Narrative:                  Spoke with patient at the bedside. Patient readmitted following surgical admission. Confirmed active with [facility] for [facility]. Has wound vac from [employer] at bedside, however, vac dressing currently off. Patient states that she will be discharged later today. Patient will need [facility] RN order for resumption of services. TOC following for transition needs.   Expected Discharge Plan: [place] Barriers to Discharge: Continued Medical Work up   Patient Goals and CMS Choice Patient states their goals for this hospitalization and ongoing recovery are:: return home CMS Medicare.gov Compare Post Acute Care list provided to:: Patient Choice offered to / list presented to : Patient  Expected Discharge Plan and Services Expected Discharge Plan: [place] In-house Referral: NA Discharge Planning Services: CM Consult Post Acute Care Choice: [place] arrangements for the past 2 months: Apartment                           HH Arranged: RN [place] Agency: [place] Date [place]: 02/29/20 Time HH Agency Contacted: 54 Representative spoke with at [place]: Tommi Rumps  Prior Living Arrangements/Services Living arrangements for the past 2 months: [place] with:: Self Patient language and need for interpreter reviewed:: Yes        Need for Family Participation in Patient Care: Yes (Comment) Care giver support system in place?: Yes (comment) Current home services: DME, Home RN (wound vac from [employer]; [facility] RN from [place]) Criminal Activity/Legal Involvement Pertinent to Current Situation/Hospitalization: No - Comment as needed  Activities of Daily  Living Home Assistive Devices/Equipment: None ADL Screening (condition at time of admission) Patient's cognitive ability adequate to safely complete daily activities?: Yes Is the patient deaf or have difficulty hearing?: No Does the patient have difficulty seeing, even when wearing glasses/contacts?: No Does the patient have difficulty concentrating, remembering, or making decisions?: No Patient able to express need for assistance with ADLs?: Yes Does the patient have difficulty dressing or bathing?: No Independently performs ADLs?: Yes (appropriate for developmental age) Does the patient have difficulty walking or climbing stairs?: No Weakness of Legs: None Weakness of Arms/Hands: None  Permission Sought/Granted                  Emotional Assessment Appearance:: Appears stated age Attitude/Demeanor/Rapport: Engaged Affect (typically observed): Accepting Orientation: : Oriented to Self, Oriented to  Time, Oriented to Place, Oriented to Situation Alcohol / Substance Use: Not Applicable Psych Involvement: No (comment)  Admission diagnosis:  Intra-abdominal abscess ([place]) [K65.1] Sepsis ([place]) [A41.9] Patient Active Problem List   Diagnosis Date Noted  . Fever   . Splenomegaly   . Sepsis ([place]) 02/26/2020  . Hyponatremia   . Intra-abdominal abscess ([place])   . Poor venous access   . Hypophosphatemia   . SOB (shortness of breath)   . Lower extremity edema   . Dyspnea   . Status post exploratory laparotomy   . Abdominal pain   . Elevated LFTs   . Marginal ulcers   . History of Roux-en-Y gastric bypass   . Intractable nausea and vomiting 02/09/2020  .  Hypokalemia   . Pseudocyst of pancreas   . SBO (small bowel obstruction) ([place])   . History of pancreatitis 07/20/2019  . Status post gastric bypass for obesity 07/20/2019  . Anemia, B12 deficiency 07/20/2019  . Current smoker 07/20/2019  . Chronic back pain 07/20/2019  . Ovarian cyst 07/20/2019  . GERD (gastroesophageal  reflux disease) 07/20/2019  . Morbid obesity ([place]) 07/20/2019   PCP:  [name], MD Pharmacy:   [id number] [address], [place] [address] Phone: [phone] Fax: [phone]     Social Determinants of Health (SDOH) Interventions    Readmission Risk Interventions No flowsheet data found.

## 2020-03-01 DIAGNOSIS — F17210 Nicotine dependence, cigarettes, uncomplicated: Secondary | ICD-10-CM | POA: Diagnosis not present

## 2020-03-01 DIAGNOSIS — T8144XA Sepsis following a procedure, initial encounter: Secondary | ICD-10-CM | POA: Diagnosis not present

## 2020-03-01 DIAGNOSIS — T8143XA Infection following a procedure, organ and space surgical site, initial encounter: Secondary | ICD-10-CM | POA: Diagnosis not present

## 2020-03-01 DIAGNOSIS — K863 Pseudocyst of pancreas: Secondary | ICD-10-CM | POA: Diagnosis not present

## 2020-03-01 DIAGNOSIS — K91870 Postprocedural hematoma of a digestive system organ or structure following a digestive system procedure: Secondary | ICD-10-CM | POA: Diagnosis not present

## 2020-03-01 DIAGNOSIS — K219 Gastro-esophageal reflux disease without esophagitis: Secondary | ICD-10-CM | POA: Diagnosis not present

## 2020-03-01 DIAGNOSIS — Z87440 Personal history of urinary (tract) infections: Secondary | ICD-10-CM | POA: Diagnosis not present

## 2020-03-01 DIAGNOSIS — Z6837 Body mass index (BMI) 37.0-37.9, adult: Secondary | ICD-10-CM | POA: Diagnosis not present

## 2020-03-01 DIAGNOSIS — A419 Sepsis, unspecified organism: Secondary | ICD-10-CM | POA: Diagnosis not present

## 2020-03-01 DIAGNOSIS — D519 Vitamin B12 deficiency anemia, unspecified: Secondary | ICD-10-CM | POA: Diagnosis not present

## 2020-03-01 DIAGNOSIS — Z9181 History of falling: Secondary | ICD-10-CM | POA: Diagnosis not present

## 2020-03-01 LAB — CULTURE, BLOOD (ROUTINE X 2)
Culture: NO GROWTH
Culture: NO GROWTH

## 2020-03-03 ENCOUNTER — Telehealth: Payer: Self-pay

## 2020-03-03 ENCOUNTER — Other Ambulatory Visit: Payer: Self-pay | Admitting: Family Medicine

## 2020-03-03 ENCOUNTER — Telehealth: Payer: Self-pay | Admitting: *Deleted

## 2020-03-03 MED ORDER — PANTOPRAZOLE SODIUM 40 MG PO TBEC: 40.0000 mg | DELAYED_RELEASE_TABLET | Freq: Two times a day (BID) | ORAL | 2 refills | Status: DC

## 2020-03-03 MED ORDER — OXYCODONE HCL 5 MG PO CAPS: 5.0000 mg | ORAL_CAPSULE | ORAL | 0 refills | Status: DC | PRN

## 2020-03-03 NOTE — Telephone Encounter (Signed)
Received fax from pharmacy, PA needed on protonix granules.  Clinical questions submitted via Cover My Meds.  Waiting on response, could take up to 72 hours.  Cover My Meds info: Key: [id number]  Alexis Avila, CMA

## 2020-03-03 NOTE — Telephone Encounter (Signed)
Patient calls nurse line requesting a refill on pain prescription. Patient reports she has been taking "sometimes 2" and has run out of 8/11 prescription. Patient has a hospital follow-up on 8/23. Patient is requesting something to help with the pain between now and Monday. Advised patient I would forward to provider however, its is late in the afternoon and we typically do not give narcotics over the phone.

## 2020-03-03 NOTE — Telephone Encounter (Signed)
Request denied.  If you would like to appeal please provide specific, detailed clinical rationale to support your request for redetermination.  I will submit via Cover My meds.  Christen Bame, CMA

## 2020-03-03 NOTE — Progress Notes (Signed)
Alexis Avila was called regarding her request for additional opioids prior to her appointment on 8/24.  She notes that she is still in significant pain from her abdominal surgery and continues to have an open wound on her stomach.  She is scheduled for a follow-up appointment with me on 8/24.  She is only taking oxycodone 5 mg 3 times daily for pain control.  She is not taking Tylenol.  She was encouraged to take Tylenol 1000 mg 4 times daily so that she would not have to be so dependent on narcotics.  She was given a refill of 13 tablets of oxycodone 5 to give her sufficient medication to make it to her appointment.  She was encouraged to follow-up with her surgeon for additional concerns regarding her abdominal wound and its care.

## 2020-03-03 NOTE — Telephone Encounter (Signed)
I just reordered Protonix without the fancy granules.  I spoke with the patient and she was not aware of any special requirements.

## 2020-03-04 DIAGNOSIS — Z9181 History of falling: Secondary | ICD-10-CM | POA: Diagnosis not present

## 2020-03-04 DIAGNOSIS — Z87440 Personal history of urinary (tract) infections: Secondary | ICD-10-CM | POA: Diagnosis not present

## 2020-03-04 DIAGNOSIS — T8144XA Sepsis following a procedure, initial encounter: Secondary | ICD-10-CM | POA: Diagnosis not present

## 2020-03-04 DIAGNOSIS — D519 Vitamin B12 deficiency anemia, unspecified: Secondary | ICD-10-CM | POA: Diagnosis not present

## 2020-03-04 DIAGNOSIS — K863 Pseudocyst of pancreas: Secondary | ICD-10-CM | POA: Diagnosis not present

## 2020-03-04 DIAGNOSIS — A419 Sepsis, unspecified organism: Secondary | ICD-10-CM | POA: Diagnosis not present

## 2020-03-04 DIAGNOSIS — K91870 Postprocedural hematoma of a digestive system organ or structure following a digestive system procedure: Secondary | ICD-10-CM | POA: Diagnosis not present

## 2020-03-04 DIAGNOSIS — T8143XA Infection following a procedure, organ and space surgical site, initial encounter: Secondary | ICD-10-CM | POA: Diagnosis not present

## 2020-03-04 DIAGNOSIS — Z6837 Body mass index (BMI) 37.0-37.9, adult: Secondary | ICD-10-CM | POA: Diagnosis not present

## 2020-03-04 DIAGNOSIS — F17210 Nicotine dependence, cigarettes, uncomplicated: Secondary | ICD-10-CM | POA: Diagnosis not present

## 2020-03-04 DIAGNOSIS — K219 Gastro-esophageal reflux disease without esophagitis: Secondary | ICD-10-CM | POA: Diagnosis not present

## 2020-03-06 ENCOUNTER — Ambulatory Visit: Payer: BC Managed Care – PPO

## 2020-03-06 ENCOUNTER — Telehealth: Payer: Self-pay | Admitting: Family Medicine

## 2020-03-06 ENCOUNTER — Other Ambulatory Visit: Payer: Self-pay | Admitting: Family Medicine

## 2020-03-06 DIAGNOSIS — A419 Sepsis, unspecified organism: Secondary | ICD-10-CM | POA: Diagnosis not present

## 2020-03-06 DIAGNOSIS — D519 Vitamin B12 deficiency anemia, unspecified: Secondary | ICD-10-CM | POA: Diagnosis not present

## 2020-03-06 DIAGNOSIS — T8143XA Infection following a procedure, organ and space surgical site, initial encounter: Secondary | ICD-10-CM | POA: Diagnosis not present

## 2020-03-06 DIAGNOSIS — Z6837 Body mass index (BMI) 37.0-37.9, adult: Secondary | ICD-10-CM | POA: Diagnosis not present

## 2020-03-06 DIAGNOSIS — F17210 Nicotine dependence, cigarettes, uncomplicated: Secondary | ICD-10-CM | POA: Diagnosis not present

## 2020-03-06 DIAGNOSIS — K91870 Postprocedural hematoma of a digestive system organ or structure following a digestive system procedure: Secondary | ICD-10-CM | POA: Diagnosis not present

## 2020-03-06 DIAGNOSIS — K863 Pseudocyst of pancreas: Secondary | ICD-10-CM | POA: Diagnosis not present

## 2020-03-06 DIAGNOSIS — Z87440 Personal history of urinary (tract) infections: Secondary | ICD-10-CM | POA: Diagnosis not present

## 2020-03-06 DIAGNOSIS — K219 Gastro-esophageal reflux disease without esophagitis: Secondary | ICD-10-CM | POA: Diagnosis not present

## 2020-03-06 DIAGNOSIS — Z9181 History of falling: Secondary | ICD-10-CM | POA: Diagnosis not present

## 2020-03-06 DIAGNOSIS — T8144XA Sepsis following a procedure, initial encounter: Secondary | ICD-10-CM | POA: Diagnosis not present

## 2020-03-06 MED ORDER — OXYCODONE HCL 5 MG PO CAPS: 5.0000 mg | ORAL_CAPSULE | ORAL | 0 refills | Status: DC | PRN

## 2020-03-06 NOTE — Telephone Encounter (Signed)
I provided for additional pills of oxycodone.  Per my previous conversation with Ms. Sprigg, this sounds like it is a surgical issue and she should follow-up with her surgeon.  I do not plan on providing additional pain medication.

## 2020-03-06 NOTE — Telephone Encounter (Signed)
Pt is requesting a refill for pain medication.  Appt rescheduled for 03/07/20

## 2020-03-06 NOTE — Telephone Encounter (Signed)
Perfect!  Will let you know if anything else needs to be done. Christen Bame, CMA

## 2020-03-06 NOTE — Telephone Encounter (Signed)
Will forward to MD. Brace Welte,CMA  

## 2020-03-07 ENCOUNTER — Ambulatory Visit (INDEPENDENT_AMBULATORY_CARE_PROVIDER_SITE_OTHER): Payer: BC Managed Care – PPO | Admitting: Family Medicine

## 2020-03-07 ENCOUNTER — Other Ambulatory Visit: Payer: Self-pay

## 2020-03-07 VITALS — BP 117/80 | HR 114 | Ht 70.0 in | Wt 285.0 lb

## 2020-03-07 DIAGNOSIS — D510 Vitamin B12 deficiency anemia due to intrinsic factor deficiency: Secondary | ICD-10-CM | POA: Diagnosis not present

## 2020-03-07 DIAGNOSIS — R600 Localized edema: Secondary | ICD-10-CM

## 2020-03-07 DIAGNOSIS — E878 Other disorders of electrolyte and fluid balance, not elsewhere classified: Secondary | ICD-10-CM

## 2020-03-07 DIAGNOSIS — Z9889 Other specified postprocedural states: Secondary | ICD-10-CM | POA: Diagnosis not present

## 2020-03-07 DIAGNOSIS — K863 Pseudocyst of pancreas: Secondary | ICD-10-CM | POA: Diagnosis not present

## 2020-03-07 MED ORDER — POLYETHYLENE GLYCOL 3350 17 GM/SCOOP PO POWD
17.0000 g | Freq: Two times a day (BID) | ORAL | 1 refills | Status: DC | PRN
Start: 2020-03-07 — End: 2020-04-10

## 2020-03-07 MED ORDER — OXYCODONE HCL 5 MG PO CAPS: 5.0000 mg | ORAL_CAPSULE | ORAL | 0 refills | Status: DC | PRN

## 2020-03-07 NOTE — Patient Instructions (Addendum)
It was nice to see you today,  I have written you a prescription for another weeks worth of oxycodone.  Try to take this no more than twice a day.  If you need other medication for pain relief, you can take the Tylenol you have been taking.  We will get your blood work today, and I will follow up with you if any of it is abnormal.  Please go to your surgery appointment in 1 week.  Please schedule an appointment with your PCP, Dr. Pilar Plate, for 2 weeks from now if possible.  Please take MiraLAX once a day to avoid constipation.  We want you to be having at least 1-2 bowel movements a day.  Have a great day,  Clemetine Marker, MD

## 2020-03-07 NOTE — Progress Notes (Signed)
    SUBJECTIVE:   CHIEF COMPLAINT / HPI:   Hospital follow-up: Patient is feeling better except for the pain which is still ongoing. She is having back pain, which she has had prior to her recent admission, as well as pain at the insertion sites of her drains and her wound VAC. She has not had any nausea vomiting or diarrhea. She has loose stools daily. She has not noticed any signs of infection at the site of her surgery or drains. She is a heavy fevers since being discharged. She was taking oxycodone, but ran out recently. She has a follow-up appointment at the beginning of September with her surgeon. The patient states her lower extremity edema has improved since discharge. She is no longer taking the Lasix. She still has tenderness to touch in her lower extremities. Denies shortness of breath or chest pain.   PERTINENT  PMH / PSH: Recent exploratory laparotomy  OBJECTIVE:   BP 117/80   Pulse (!) 114   Ht 5\' 10"  (1.778 m)   Wt 285 lb (129.3 kg)   SpO2 97%   BMI 40.89 kg/m   General: Alert, oriented. Mild to moderate pain. CV: Regular rate and rhythm, no murmurs. Pulmonary: Lungs clear to auscultation bilaterally, no wheezes or crackles. GI: Patient has 2 drains on the left side of her abdomen. At the site of insertion for both of these there is no signs of infection or bleeding. Has a wound VAC in place with no signs of infection. Serosanguineous drainage in the wound VAC reservoir. Patient is diffusely tender to light palpation. Patient moving gingerly when having to lay on her back. Extremities: 1+ pitting edema in the lower extremities bilaterally. Forelegs are tender to palpation. No asymmetry in size between left and right  ASSESSMENT/PLAN:   Status post exploratory laparotomy Patient has a follow-up visit with her surgeon in approximately 1 week. Patient still with significant pain as expected with her recent surgeries. No signs of infection or bleeding. Appears to be healing  appropriately. We will prescribe additional oxycodone to help patient get through to her surgery appointment, but she has already discussed with her PCP, Dr. Pilar Plate, that he will not be prescribing opioids for her in the future and that he believes she should discuss this with surgery on her next visit if she still requires opioids for pain management. Advised patient to return to clinic if she starts to develop fever or believes she is otherwise developing an infection.  Lower extremity edema Appears to be improving. Patient had finished to the Lasix prescription that she was sent home with. Still with some edema but patient reports improved symptoms. Will not prescribe further Lasix at this time. Given her normal kidney function, absence of heart failure, and the fact that she is no longer getting IV fluids, the edema should improve on its own. No concern for DVT as it is symmetrical, present since her hospitalization and responsive to Lasix. she is not complaining of any symptoms concerning for PE at this time.  Electrolyte abnormality Patient with hypokalemia, hypomagnesemia, hypophosphatemia with normal kidney function while hospitalized. Patient was sent home with magnesium, potassium, phosphorus supplementation. She has completed this prescription. Will check BMP, mag, phos for changes and replace as needed.     Benay Pike, MD [place]

## 2020-03-08 DIAGNOSIS — Z6837 Body mass index (BMI) 37.0-37.9, adult: Secondary | ICD-10-CM | POA: Diagnosis not present

## 2020-03-08 DIAGNOSIS — K863 Pseudocyst of pancreas: Secondary | ICD-10-CM | POA: Diagnosis not present

## 2020-03-08 DIAGNOSIS — D519 Vitamin B12 deficiency anemia, unspecified: Secondary | ICD-10-CM | POA: Diagnosis not present

## 2020-03-08 DIAGNOSIS — Z9181 History of falling: Secondary | ICD-10-CM | POA: Diagnosis not present

## 2020-03-08 DIAGNOSIS — K219 Gastro-esophageal reflux disease without esophagitis: Secondary | ICD-10-CM | POA: Diagnosis not present

## 2020-03-08 DIAGNOSIS — F17210 Nicotine dependence, cigarettes, uncomplicated: Secondary | ICD-10-CM | POA: Diagnosis not present

## 2020-03-08 DIAGNOSIS — K91870 Postprocedural hematoma of a digestive system organ or structure following a digestive system procedure: Secondary | ICD-10-CM | POA: Diagnosis not present

## 2020-03-08 DIAGNOSIS — Z87440 Personal history of urinary (tract) infections: Secondary | ICD-10-CM | POA: Diagnosis not present

## 2020-03-08 DIAGNOSIS — A419 Sepsis, unspecified organism: Secondary | ICD-10-CM | POA: Diagnosis not present

## 2020-03-08 DIAGNOSIS — T8144XA Sepsis following a procedure, initial encounter: Secondary | ICD-10-CM | POA: Diagnosis not present

## 2020-03-08 DIAGNOSIS — T8189XA Other complications of procedures, not elsewhere classified, initial encounter: Secondary | ICD-10-CM | POA: Diagnosis not present

## 2020-03-08 DIAGNOSIS — T8143XA Infection following a procedure, organ and space surgical site, initial encounter: Secondary | ICD-10-CM | POA: Diagnosis not present

## 2020-03-08 LAB — MAGNESIUM: Magnesium: 1.8 mg/dL (ref 1.6–2.3)

## 2020-03-08 LAB — BASIC METABOLIC PANEL
BUN/Creatinine Ratio: 8 — ABNORMAL LOW (ref 9–23)
BUN: 3 mg/dL — ABNORMAL LOW (ref 6–20)
CO2: 20 mmol/L (ref 20–29)
Calcium: 9.1 mg/dL (ref 8.7–10.2)
Chloride: 105 mmol/L (ref 96–106)
Creatinine, Ser: 0.39 mg/dL — ABNORMAL LOW (ref 0.57–1.00)
GFR calc Af Amer: 154 mL/min/{1.73_m2} (ref >59)
GFR calc non Af Amer: 134 mL/min/{1.73_m2} (ref >59)
Glucose: 105 mg/dL — ABNORMAL HIGH (ref 65–99)
Potassium: 3.9 mmol/L (ref 3.5–5.2)
Sodium: 136 mmol/L (ref 134–144)

## 2020-03-08 LAB — CBC
Hematocrit: 31 % — ABNORMAL LOW (ref 34.0–46.6)
Hemoglobin: 9.2 g/dL — ABNORMAL LOW (ref 11.1–15.9)
MCH: 26.4 pg — ABNORMAL LOW (ref 26.6–33.0)
MCHC: 29.7 g/dL — ABNORMAL LOW (ref 31.5–35.7)
MCV: 89 fL (ref 79–97)
Platelets: 324 10*3/uL (ref 150–450)
RBC: 3.49 x10E6/uL — ABNORMAL LOW (ref 3.77–5.28)
RDW: 19.4 % — ABNORMAL HIGH (ref 11.7–15.4)
WBC: 8.3 10*3/uL (ref 3.4–10.8)

## 2020-03-08 LAB — PHOSPHORUS: Phosphorus: 1.9 mg/dL — ABNORMAL LOW (ref 3.0–4.3)

## 2020-03-10 ENCOUNTER — Other Ambulatory Visit: Payer: Self-pay | Admitting: Family Medicine

## 2020-03-10 DIAGNOSIS — K863 Pseudocyst of pancreas: Secondary | ICD-10-CM | POA: Diagnosis not present

## 2020-03-10 DIAGNOSIS — A419 Sepsis, unspecified organism: Secondary | ICD-10-CM | POA: Diagnosis not present

## 2020-03-10 DIAGNOSIS — Z6837 Body mass index (BMI) 37.0-37.9, adult: Secondary | ICD-10-CM | POA: Diagnosis not present

## 2020-03-10 DIAGNOSIS — Z9181 History of falling: Secondary | ICD-10-CM | POA: Diagnosis not present

## 2020-03-10 DIAGNOSIS — T8144XA Sepsis following a procedure, initial encounter: Secondary | ICD-10-CM | POA: Diagnosis not present

## 2020-03-10 DIAGNOSIS — K219 Gastro-esophageal reflux disease without esophagitis: Secondary | ICD-10-CM | POA: Diagnosis not present

## 2020-03-10 DIAGNOSIS — K91870 Postprocedural hematoma of a digestive system organ or structure following a digestive system procedure: Secondary | ICD-10-CM | POA: Diagnosis not present

## 2020-03-10 DIAGNOSIS — D519 Vitamin B12 deficiency anemia, unspecified: Secondary | ICD-10-CM | POA: Diagnosis not present

## 2020-03-10 DIAGNOSIS — T8143XA Infection following a procedure, organ and space surgical site, initial encounter: Secondary | ICD-10-CM | POA: Diagnosis not present

## 2020-03-10 DIAGNOSIS — Z87440 Personal history of urinary (tract) infections: Secondary | ICD-10-CM | POA: Diagnosis not present

## 2020-03-10 DIAGNOSIS — F17210 Nicotine dependence, cigarettes, uncomplicated: Secondary | ICD-10-CM | POA: Diagnosis not present

## 2020-03-10 DIAGNOSIS — E878 Other disorders of electrolyte and fluid balance, not elsewhere classified: Secondary | ICD-10-CM | POA: Insufficient documentation

## 2020-03-10 MED ORDER — POTASSIUM PHOSPHATE MONOBASIC 500 MG PO TABS: 1000.0000 mg | ORAL_TABLET | Freq: Three times a day (TID) | ORAL | 0 refills | Status: DC

## 2020-03-10 NOTE — Assessment & Plan Note (Signed)
Appears to be improving. Patient had finished to the Lasix prescription that she was sent home with. Still with some edema but patient reports improved symptoms. Will not prescribe further Lasix at this time. Given her normal kidney function, absence of heart failure, and the fact that she is no longer getting IV fluids, the edema should improve on its own. No concern for DVT as it is symmetrical, present since her hospitalization and responsive to Lasix. she is not complaining of any symptoms concerning for PE at this time.

## 2020-03-10 NOTE — Progress Notes (Signed)
Ordered K-phos for continued hypophosphatemia

## 2020-03-10 NOTE — Assessment & Plan Note (Addendum)
Patient with hypokalemia, hypomagnesemia, hypophosphatemia with normal kidney function while hospitalized. Patient was sent home with magnesium, potassium, phosphorus supplementation. She has completed this prescription. Will check BMP, mag, phos for changes and replace as needed.

## 2020-03-10 NOTE — Assessment & Plan Note (Signed)
Patient has a follow-up visit with her surgeon in approximately 1 week. Patient still with significant pain as expected with her recent surgeries. No signs of infection or bleeding. Appears to be healing appropriately. We will prescribe additional oxycodone to help patient get through to her surgery appointment, but she has already discussed with her PCP, Dr. Pilar Plate, that he will not be prescribing opioids for her in the future and that he believes she should discuss this with surgery on her next visit if she still requires opioids for pain management. Advised patient to return to clinic if she starts to develop fever or believes she is otherwise developing an infection.

## 2020-03-13 DIAGNOSIS — K91870 Postprocedural hematoma of a digestive system organ or structure following a digestive system procedure: Secondary | ICD-10-CM | POA: Diagnosis not present

## 2020-03-13 DIAGNOSIS — T8143XA Infection following a procedure, organ and space surgical site, initial encounter: Secondary | ICD-10-CM | POA: Diagnosis not present

## 2020-03-13 DIAGNOSIS — T8144XA Sepsis following a procedure, initial encounter: Secondary | ICD-10-CM | POA: Diagnosis not present

## 2020-03-13 DIAGNOSIS — K863 Pseudocyst of pancreas: Secondary | ICD-10-CM | POA: Diagnosis not present

## 2020-03-13 DIAGNOSIS — F17210 Nicotine dependence, cigarettes, uncomplicated: Secondary | ICD-10-CM | POA: Diagnosis not present

## 2020-03-13 DIAGNOSIS — Z6837 Body mass index (BMI) 37.0-37.9, adult: Secondary | ICD-10-CM | POA: Diagnosis not present

## 2020-03-13 DIAGNOSIS — Z9181 History of falling: Secondary | ICD-10-CM | POA: Diagnosis not present

## 2020-03-13 DIAGNOSIS — K219 Gastro-esophageal reflux disease without esophagitis: Secondary | ICD-10-CM | POA: Diagnosis not present

## 2020-03-13 DIAGNOSIS — Z87440 Personal history of urinary (tract) infections: Secondary | ICD-10-CM | POA: Diagnosis not present

## 2020-03-13 DIAGNOSIS — D519 Vitamin B12 deficiency anemia, unspecified: Secondary | ICD-10-CM | POA: Diagnosis not present

## 2020-03-13 DIAGNOSIS — A419 Sepsis, unspecified organism: Secondary | ICD-10-CM | POA: Diagnosis not present

## 2020-03-15 DIAGNOSIS — K219 Gastro-esophageal reflux disease without esophagitis: Secondary | ICD-10-CM | POA: Diagnosis not present

## 2020-03-15 DIAGNOSIS — D519 Vitamin B12 deficiency anemia, unspecified: Secondary | ICD-10-CM | POA: Diagnosis not present

## 2020-03-15 DIAGNOSIS — T8144XA Sepsis following a procedure, initial encounter: Secondary | ICD-10-CM | POA: Diagnosis not present

## 2020-03-15 DIAGNOSIS — A419 Sepsis, unspecified organism: Secondary | ICD-10-CM | POA: Diagnosis not present

## 2020-03-15 DIAGNOSIS — Z87440 Personal history of urinary (tract) infections: Secondary | ICD-10-CM | POA: Diagnosis not present

## 2020-03-15 DIAGNOSIS — Z6837 Body mass index (BMI) 37.0-37.9, adult: Secondary | ICD-10-CM | POA: Diagnosis not present

## 2020-03-15 DIAGNOSIS — Z9181 History of falling: Secondary | ICD-10-CM | POA: Diagnosis not present

## 2020-03-15 DIAGNOSIS — F17210 Nicotine dependence, cigarettes, uncomplicated: Secondary | ICD-10-CM | POA: Diagnosis not present

## 2020-03-15 DIAGNOSIS — K91870 Postprocedural hematoma of a digestive system organ or structure following a digestive system procedure: Secondary | ICD-10-CM | POA: Diagnosis not present

## 2020-03-15 DIAGNOSIS — K863 Pseudocyst of pancreas: Secondary | ICD-10-CM | POA: Diagnosis not present

## 2020-03-15 DIAGNOSIS — T8143XA Infection following a procedure, organ and space surgical site, initial encounter: Secondary | ICD-10-CM | POA: Diagnosis not present

## 2020-03-17 DIAGNOSIS — D519 Vitamin B12 deficiency anemia, unspecified: Secondary | ICD-10-CM | POA: Diagnosis not present

## 2020-03-17 DIAGNOSIS — T8143XA Infection following a procedure, organ and space surgical site, initial encounter: Secondary | ICD-10-CM | POA: Diagnosis not present

## 2020-03-17 DIAGNOSIS — K91870 Postprocedural hematoma of a digestive system organ or structure following a digestive system procedure: Secondary | ICD-10-CM | POA: Diagnosis not present

## 2020-03-17 DIAGNOSIS — Z6837 Body mass index (BMI) 37.0-37.9, adult: Secondary | ICD-10-CM | POA: Diagnosis not present

## 2020-03-17 DIAGNOSIS — Z87440 Personal history of urinary (tract) infections: Secondary | ICD-10-CM | POA: Diagnosis not present

## 2020-03-17 DIAGNOSIS — A419 Sepsis, unspecified organism: Secondary | ICD-10-CM | POA: Diagnosis not present

## 2020-03-17 DIAGNOSIS — T8144XA Sepsis following a procedure, initial encounter: Secondary | ICD-10-CM | POA: Diagnosis not present

## 2020-03-17 DIAGNOSIS — K219 Gastro-esophageal reflux disease without esophagitis: Secondary | ICD-10-CM | POA: Diagnosis not present

## 2020-03-17 DIAGNOSIS — F17210 Nicotine dependence, cigarettes, uncomplicated: Secondary | ICD-10-CM | POA: Diagnosis not present

## 2020-03-17 DIAGNOSIS — Z9181 History of falling: Secondary | ICD-10-CM | POA: Diagnosis not present

## 2020-03-17 DIAGNOSIS — K863 Pseudocyst of pancreas: Secondary | ICD-10-CM | POA: Diagnosis not present

## 2020-03-20 DIAGNOSIS — D519 Vitamin B12 deficiency anemia, unspecified: Secondary | ICD-10-CM | POA: Diagnosis not present

## 2020-03-20 DIAGNOSIS — T8144XA Sepsis following a procedure, initial encounter: Secondary | ICD-10-CM | POA: Diagnosis not present

## 2020-03-20 DIAGNOSIS — K863 Pseudocyst of pancreas: Secondary | ICD-10-CM | POA: Diagnosis not present

## 2020-03-20 DIAGNOSIS — Z9181 History of falling: Secondary | ICD-10-CM | POA: Diagnosis not present

## 2020-03-20 DIAGNOSIS — K91870 Postprocedural hematoma of a digestive system organ or structure following a digestive system procedure: Secondary | ICD-10-CM | POA: Diagnosis not present

## 2020-03-20 DIAGNOSIS — Z6837 Body mass index (BMI) 37.0-37.9, adult: Secondary | ICD-10-CM | POA: Diagnosis not present

## 2020-03-20 DIAGNOSIS — Z87440 Personal history of urinary (tract) infections: Secondary | ICD-10-CM | POA: Diagnosis not present

## 2020-03-20 DIAGNOSIS — F17210 Nicotine dependence, cigarettes, uncomplicated: Secondary | ICD-10-CM | POA: Diagnosis not present

## 2020-03-20 DIAGNOSIS — A419 Sepsis, unspecified organism: Secondary | ICD-10-CM | POA: Diagnosis not present

## 2020-03-20 DIAGNOSIS — K219 Gastro-esophageal reflux disease without esophagitis: Secondary | ICD-10-CM | POA: Diagnosis not present

## 2020-03-20 DIAGNOSIS — T8143XA Infection following a procedure, organ and space surgical site, initial encounter: Secondary | ICD-10-CM | POA: Diagnosis not present

## 2020-03-22 ENCOUNTER — Other Ambulatory Visit: Payer: Self-pay | Admitting: Family Medicine

## 2020-03-22 DIAGNOSIS — T8144XA Sepsis following a procedure, initial encounter: Secondary | ICD-10-CM | POA: Diagnosis not present

## 2020-03-22 DIAGNOSIS — K863 Pseudocyst of pancreas: Secondary | ICD-10-CM | POA: Diagnosis not present

## 2020-03-22 DIAGNOSIS — Z87440 Personal history of urinary (tract) infections: Secondary | ICD-10-CM | POA: Diagnosis not present

## 2020-03-22 DIAGNOSIS — K219 Gastro-esophageal reflux disease without esophagitis: Secondary | ICD-10-CM | POA: Diagnosis not present

## 2020-03-22 DIAGNOSIS — T8143XA Infection following a procedure, organ and space surgical site, initial encounter: Secondary | ICD-10-CM | POA: Diagnosis not present

## 2020-03-22 DIAGNOSIS — Z6837 Body mass index (BMI) 37.0-37.9, adult: Secondary | ICD-10-CM | POA: Diagnosis not present

## 2020-03-22 DIAGNOSIS — A419 Sepsis, unspecified organism: Secondary | ICD-10-CM | POA: Diagnosis not present

## 2020-03-22 DIAGNOSIS — D519 Vitamin B12 deficiency anemia, unspecified: Secondary | ICD-10-CM | POA: Diagnosis not present

## 2020-03-22 DIAGNOSIS — F17210 Nicotine dependence, cigarettes, uncomplicated: Secondary | ICD-10-CM | POA: Diagnosis not present

## 2020-03-22 DIAGNOSIS — Z9181 History of falling: Secondary | ICD-10-CM | POA: Diagnosis not present

## 2020-03-22 DIAGNOSIS — K91870 Postprocedural hematoma of a digestive system organ or structure following a digestive system procedure: Secondary | ICD-10-CM | POA: Diagnosis not present

## 2020-03-24 ENCOUNTER — Other Ambulatory Visit: Payer: Self-pay

## 2020-03-24 ENCOUNTER — Other Ambulatory Visit (HOSPITAL_COMMUNITY): Payer: Self-pay | Admitting: General Surgery

## 2020-03-24 ENCOUNTER — Other Ambulatory Visit: Payer: Self-pay | Admitting: General Surgery

## 2020-03-24 ENCOUNTER — Ambulatory Visit (HOSPITAL_COMMUNITY)
Admission: RE | Admit: 2020-03-24 | Discharge: 2020-03-24 | Disposition: A | Discharge status: Home / Self Care | Payer: BC Managed Care – PPO | Source: Ambulatory Visit | Attending: General Surgery | Admitting: General Surgery

## 2020-03-24 DIAGNOSIS — Z9889 Other specified postprocedural states: Secondary | ICD-10-CM

## 2020-03-24 DIAGNOSIS — K219 Gastro-esophageal reflux disease without esophagitis: Secondary | ICD-10-CM | POA: Diagnosis not present

## 2020-03-24 DIAGNOSIS — Z87440 Personal history of urinary (tract) infections: Secondary | ICD-10-CM | POA: Diagnosis not present

## 2020-03-24 DIAGNOSIS — T8143XA Infection following a procedure, organ and space surgical site, initial encounter: Secondary | ICD-10-CM | POA: Diagnosis not present

## 2020-03-24 DIAGNOSIS — D259 Leiomyoma of uterus, unspecified: Secondary | ICD-10-CM | POA: Diagnosis not present

## 2020-03-24 DIAGNOSIS — T8144XA Sepsis following a procedure, initial encounter: Secondary | ICD-10-CM | POA: Diagnosis not present

## 2020-03-24 DIAGNOSIS — F17210 Nicotine dependence, cigarettes, uncomplicated: Secondary | ICD-10-CM | POA: Diagnosis not present

## 2020-03-24 DIAGNOSIS — D519 Vitamin B12 deficiency anemia, unspecified: Secondary | ICD-10-CM | POA: Diagnosis not present

## 2020-03-24 DIAGNOSIS — K91870 Postprocedural hematoma of a digestive system organ or structure following a digestive system procedure: Secondary | ICD-10-CM | POA: Diagnosis not present

## 2020-03-24 DIAGNOSIS — K863 Pseudocyst of pancreas: Secondary | ICD-10-CM | POA: Diagnosis not present

## 2020-03-24 DIAGNOSIS — Z9181 History of falling: Secondary | ICD-10-CM | POA: Diagnosis not present

## 2020-03-24 DIAGNOSIS — Z9049 Acquired absence of other specified parts of digestive tract: Secondary | ICD-10-CM | POA: Diagnosis not present

## 2020-03-24 DIAGNOSIS — R109 Unspecified abdominal pain: Secondary | ICD-10-CM | POA: Diagnosis not present

## 2020-03-24 DIAGNOSIS — Z6837 Body mass index (BMI) 37.0-37.9, adult: Secondary | ICD-10-CM | POA: Diagnosis not present

## 2020-03-24 DIAGNOSIS — A419 Sepsis, unspecified organism: Secondary | ICD-10-CM | POA: Diagnosis not present

## 2020-03-24 MED ORDER — IOHEXOL 300 MG/ML  SOLN
100.0000 mL | Freq: Once | INTRAMUSCULAR | Status: AC | PRN
  Administered 2020-03-24: 100 mL via INTRAVENOUS

## 2020-03-24 MED ORDER — IOHEXOL 9 MG/ML PO SOLN
ORAL | Status: AC
  Filled 2020-03-24: qty 1000

## 2020-03-27 DIAGNOSIS — K91870 Postprocedural hematoma of a digestive system organ or structure following a digestive system procedure: Secondary | ICD-10-CM | POA: Diagnosis not present

## 2020-03-27 DIAGNOSIS — A419 Sepsis, unspecified organism: Secondary | ICD-10-CM | POA: Diagnosis not present

## 2020-03-27 DIAGNOSIS — T8143XA Infection following a procedure, organ and space surgical site, initial encounter: Secondary | ICD-10-CM | POA: Diagnosis not present

## 2020-03-27 DIAGNOSIS — Z6837 Body mass index (BMI) 37.0-37.9, adult: Secondary | ICD-10-CM | POA: Diagnosis not present

## 2020-03-27 DIAGNOSIS — T8144XA Sepsis following a procedure, initial encounter: Secondary | ICD-10-CM | POA: Diagnosis not present

## 2020-03-27 DIAGNOSIS — Z87440 Personal history of urinary (tract) infections: Secondary | ICD-10-CM | POA: Diagnosis not present

## 2020-03-27 DIAGNOSIS — D519 Vitamin B12 deficiency anemia, unspecified: Secondary | ICD-10-CM | POA: Diagnosis not present

## 2020-03-27 DIAGNOSIS — K219 Gastro-esophageal reflux disease without esophagitis: Secondary | ICD-10-CM | POA: Diagnosis not present

## 2020-03-27 DIAGNOSIS — F17210 Nicotine dependence, cigarettes, uncomplicated: Secondary | ICD-10-CM | POA: Diagnosis not present

## 2020-03-27 DIAGNOSIS — Z9181 History of falling: Secondary | ICD-10-CM | POA: Diagnosis not present

## 2020-03-27 DIAGNOSIS — K863 Pseudocyst of pancreas: Secondary | ICD-10-CM | POA: Diagnosis not present

## 2020-03-28 DIAGNOSIS — T8189XA Other complications of procedures, not elsewhere classified, initial encounter: Secondary | ICD-10-CM | POA: Diagnosis not present

## 2020-03-31 DIAGNOSIS — D519 Vitamin B12 deficiency anemia, unspecified: Secondary | ICD-10-CM | POA: Diagnosis not present

## 2020-03-31 DIAGNOSIS — K863 Pseudocyst of pancreas: Secondary | ICD-10-CM | POA: Diagnosis not present

## 2020-03-31 DIAGNOSIS — Z6837 Body mass index (BMI) 37.0-37.9, adult: Secondary | ICD-10-CM | POA: Diagnosis not present

## 2020-03-31 DIAGNOSIS — K91870 Postprocedural hematoma of a digestive system organ or structure following a digestive system procedure: Secondary | ICD-10-CM | POA: Diagnosis not present

## 2020-03-31 DIAGNOSIS — T8144XA Sepsis following a procedure, initial encounter: Secondary | ICD-10-CM | POA: Diagnosis not present

## 2020-03-31 DIAGNOSIS — F17210 Nicotine dependence, cigarettes, uncomplicated: Secondary | ICD-10-CM | POA: Diagnosis not present

## 2020-03-31 DIAGNOSIS — T8143XA Infection following a procedure, organ and space surgical site, initial encounter: Secondary | ICD-10-CM | POA: Diagnosis not present

## 2020-03-31 DIAGNOSIS — A419 Sepsis, unspecified organism: Secondary | ICD-10-CM | POA: Diagnosis not present

## 2020-03-31 DIAGNOSIS — K219 Gastro-esophageal reflux disease without esophagitis: Secondary | ICD-10-CM | POA: Diagnosis not present

## 2020-03-31 DIAGNOSIS — Z9181 History of falling: Secondary | ICD-10-CM | POA: Diagnosis not present

## 2020-03-31 DIAGNOSIS — Z87440 Personal history of urinary (tract) infections: Secondary | ICD-10-CM | POA: Diagnosis not present

## 2020-04-04 DIAGNOSIS — D519 Vitamin B12 deficiency anemia, unspecified: Secondary | ICD-10-CM | POA: Diagnosis not present

## 2020-04-04 DIAGNOSIS — Z87440 Personal history of urinary (tract) infections: Secondary | ICD-10-CM | POA: Diagnosis not present

## 2020-04-04 DIAGNOSIS — K91870 Postprocedural hematoma of a digestive system organ or structure following a digestive system procedure: Secondary | ICD-10-CM | POA: Diagnosis not present

## 2020-04-04 DIAGNOSIS — F17210 Nicotine dependence, cigarettes, uncomplicated: Secondary | ICD-10-CM | POA: Diagnosis not present

## 2020-04-04 DIAGNOSIS — Z9181 History of falling: Secondary | ICD-10-CM | POA: Diagnosis not present

## 2020-04-04 DIAGNOSIS — A419 Sepsis, unspecified organism: Secondary | ICD-10-CM | POA: Diagnosis not present

## 2020-04-04 DIAGNOSIS — K219 Gastro-esophageal reflux disease without esophagitis: Secondary | ICD-10-CM | POA: Diagnosis not present

## 2020-04-04 DIAGNOSIS — Z6837 Body mass index (BMI) 37.0-37.9, adult: Secondary | ICD-10-CM | POA: Diagnosis not present

## 2020-04-04 DIAGNOSIS — T8143XA Infection following a procedure, organ and space surgical site, initial encounter: Secondary | ICD-10-CM | POA: Diagnosis not present

## 2020-04-04 DIAGNOSIS — T8144XA Sepsis following a procedure, initial encounter: Secondary | ICD-10-CM | POA: Diagnosis not present

## 2020-04-04 DIAGNOSIS — K863 Pseudocyst of pancreas: Secondary | ICD-10-CM | POA: Diagnosis not present

## 2020-04-07 ENCOUNTER — Emergency Department (HOSPITAL_COMMUNITY): Payer: BC Managed Care – PPO

## 2020-04-07 ENCOUNTER — Encounter (HOSPITAL_COMMUNITY): Payer: Self-pay | Admitting: Emergency Medicine

## 2020-04-07 ENCOUNTER — Other Ambulatory Visit: Payer: Self-pay

## 2020-04-07 ENCOUNTER — Inpatient Hospital Stay (HOSPITAL_COMMUNITY)
Admission: EM | Admit: 2020-04-07 | Discharge: 2020-04-10 | DRG: 872 | Disposition: A | Discharge status: Home / Self Care | Payer: BC Managed Care – PPO | Attending: Family Medicine | Admitting: Family Medicine

## 2020-04-07 DIAGNOSIS — E739 Lactose intolerance, unspecified: Secondary | ICD-10-CM | POA: Diagnosis present

## 2020-04-07 DIAGNOSIS — J9811 Atelectasis: Secondary | ICD-10-CM | POA: Diagnosis not present

## 2020-04-07 DIAGNOSIS — T8143XA Infection following a procedure, organ and space surgical site, initial encounter: Secondary | ICD-10-CM | POA: Diagnosis not present

## 2020-04-07 DIAGNOSIS — Z9884 Bariatric surgery status: Secondary | ICD-10-CM

## 2020-04-07 DIAGNOSIS — Z6841 Body Mass Index (BMI) 40.0 and over, adult: Secondary | ICD-10-CM

## 2020-04-07 DIAGNOSIS — A419 Sepsis, unspecified organism: Secondary | ICD-10-CM | POA: Diagnosis not present

## 2020-04-07 DIAGNOSIS — R000 Tachycardia, unspecified: Secondary | ICD-10-CM | POA: Diagnosis not present

## 2020-04-07 DIAGNOSIS — Z9181 History of falling: Secondary | ICD-10-CM | POA: Diagnosis not present

## 2020-04-07 DIAGNOSIS — Z8249 Family history of ischemic heart disease and other diseases of the circulatory system: Secondary | ICD-10-CM | POA: Diagnosis not present

## 2020-04-07 DIAGNOSIS — E669 Obesity, unspecified: Secondary | ICD-10-CM | POA: Diagnosis not present

## 2020-04-07 DIAGNOSIS — R0602 Shortness of breath: Secondary | ICD-10-CM | POA: Diagnosis not present

## 2020-04-07 DIAGNOSIS — Z6837 Body mass index (BMI) 37.0-37.9, adult: Secondary | ICD-10-CM | POA: Diagnosis not present

## 2020-04-07 DIAGNOSIS — Z87440 Personal history of urinary (tract) infections: Secondary | ICD-10-CM | POA: Diagnosis not present

## 2020-04-07 DIAGNOSIS — Z931 Gastrostomy status: Secondary | ICD-10-CM

## 2020-04-07 DIAGNOSIS — K219 Gastro-esophageal reflux disease without esophagitis: Secondary | ICD-10-CM | POA: Diagnosis not present

## 2020-04-07 DIAGNOSIS — F17210 Nicotine dependence, cigarettes, uncomplicated: Secondary | ICD-10-CM | POA: Diagnosis not present

## 2020-04-07 DIAGNOSIS — D519 Vitamin B12 deficiency anemia, unspecified: Secondary | ICD-10-CM | POA: Diagnosis not present

## 2020-04-07 DIAGNOSIS — Z825 Family history of asthma and other chronic lower respiratory diseases: Secondary | ICD-10-CM | POA: Diagnosis not present

## 2020-04-07 DIAGNOSIS — E869 Volume depletion, unspecified: Secondary | ICD-10-CM | POA: Diagnosis not present

## 2020-04-07 DIAGNOSIS — R6889 Other general symptoms and signs: Secondary | ICD-10-CM | POA: Diagnosis not present

## 2020-04-07 DIAGNOSIS — K6389 Other specified diseases of intestine: Secondary | ICD-10-CM | POA: Diagnosis not present

## 2020-04-07 DIAGNOSIS — K863 Pseudocyst of pancreas: Secondary | ICD-10-CM | POA: Diagnosis not present

## 2020-04-07 DIAGNOSIS — Z20822 Contact with and (suspected) exposure to covid-19: Secondary | ICD-10-CM | POA: Diagnosis present

## 2020-04-07 DIAGNOSIS — E538 Deficiency of other specified B group vitamins: Secondary | ICD-10-CM | POA: Diagnosis present

## 2020-04-07 DIAGNOSIS — G629 Polyneuropathy, unspecified: Secondary | ICD-10-CM | POA: Diagnosis not present

## 2020-04-07 DIAGNOSIS — Z880 Allergy status to penicillin: Secondary | ICD-10-CM

## 2020-04-07 DIAGNOSIS — K91870 Postprocedural hematoma of a digestive system organ or structure following a digestive system procedure: Secondary | ICD-10-CM | POA: Diagnosis not present

## 2020-04-07 DIAGNOSIS — R0689 Other abnormalities of breathing: Secondary | ICD-10-CM | POA: Diagnosis not present

## 2020-04-07 DIAGNOSIS — T8144XA Sepsis following a procedure, initial encounter: Secondary | ICD-10-CM | POA: Diagnosis not present

## 2020-04-07 LAB — COMPREHENSIVE METABOLIC PANEL
ALT: 9 U/L (ref 0–44)
AST: 16 U/L (ref 15–41)
Albumin: 1.9 g/dL — ABNORMAL LOW (ref 3.5–5.0)
Alkaline Phosphatase: 234 U/L — ABNORMAL HIGH (ref 38–126)
Anion gap: 9 (ref 5–15)
BUN: <5 mg/dL — ABNORMAL LOW (ref 6–20)
CO2: 19 mmol/L — ABNORMAL LOW (ref 22–32)
Calcium: 9.6 mg/dL (ref 8.9–10.3)
Chloride: 108 mmol/L (ref 98–111)
Creatinine, Ser: 0.54 mg/dL (ref 0.44–1.00)
GFR calc Af Amer: >60 mL/min (ref >60)
GFR calc non Af Amer: >60 mL/min (ref >60)
Glucose, Bld: 137 mg/dL — ABNORMAL HIGH (ref 70–99)
Potassium: 3.5 mmol/L (ref 3.5–5.1)
Sodium: 136 mmol/L (ref 135–145)
Total Bilirubin: 1 mg/dL (ref 0.3–1.2)
Total Protein: 6.4 g/dL — ABNORMAL LOW (ref 6.5–8.1)

## 2020-04-07 LAB — CBC WITH DIFFERENTIAL/PLATELET
Abs Immature Granulocytes: 0.11 10*3/uL — ABNORMAL HIGH (ref 0.00–0.07)
Basophils Absolute: 0 10*3/uL (ref 0.0–0.1)
Basophils Relative: 0 %
Eosinophils Absolute: 0 10*3/uL (ref 0.0–0.5)
Eosinophils Relative: 0 %
HCT: 35.6 % — ABNORMAL LOW (ref 36.0–46.0)
Hemoglobin: 10.5 g/dL — ABNORMAL LOW (ref 12.0–15.0)
Immature Granulocytes: 1 %
Lymphocytes Relative: 9 %
Lymphs Abs: 1.7 10*3/uL (ref 0.7–4.0)
MCH: 25.5 pg — ABNORMAL LOW (ref 26.0–34.0)
MCHC: 29.5 g/dL — ABNORMAL LOW (ref 30.0–36.0)
MCV: 86.6 fL (ref 80.0–100.0)
Monocytes Absolute: 0.5 10*3/uL (ref 0.1–1.0)
Monocytes Relative: 3 %
Neutro Abs: 15.9 10*3/uL — ABNORMAL HIGH (ref 1.7–7.7)
Neutrophils Relative %: 87 %
Platelets: 413 10*3/uL — ABNORMAL HIGH (ref 150–400)
RBC: 4.11 MIL/uL (ref 3.87–5.11)
RDW: 19.8 % — ABNORMAL HIGH (ref 11.5–15.5)
WBC: 18.2 10*3/uL — ABNORMAL HIGH (ref 4.0–10.5)
nRBC: 0 % (ref 0.0–0.2)

## 2020-04-07 LAB — I-STAT BETA HCG BLOOD, ED (MC, WL, AP ONLY): I-stat hCG, quantitative: <5 m[IU]/mL (ref <5)

## 2020-04-07 LAB — PROTIME-INR
INR: 1.6 — ABNORMAL HIGH (ref 0.8–1.2)
Prothrombin Time: 18.7 seconds — ABNORMAL HIGH (ref 11.4–15.2)

## 2020-04-07 LAB — URINALYSIS, ROUTINE W REFLEX MICROSCOPIC
Bilirubin Urine: NEGATIVE
Glucose, UA: NEGATIVE mg/dL
Hgb urine dipstick: NEGATIVE
Ketones, ur: NEGATIVE mg/dL
Leukocytes,Ua: NEGATIVE
Nitrite: NEGATIVE
Protein, ur: NEGATIVE mg/dL
Specific Gravity, Urine: >1.046 — ABNORMAL HIGH (ref 1.005–1.030)
pH: 5 (ref 5.0–8.0)

## 2020-04-07 LAB — LACTIC ACID, PLASMA
Lactic Acid, Venous: 2.9 mmol/L (ref 0.5–1.9)
Lactic Acid, Venous: 1.9 mmol/L (ref 0.5–1.9)

## 2020-04-07 LAB — RESPIRATORY PANEL BY RT PCR (FLU A&B, COVID)
Influenza A by PCR: NEGATIVE
Influenza B by PCR: NEGATIVE
SARS Coronavirus 2 by RT PCR: NEGATIVE

## 2020-04-07 LAB — LIPASE, BLOOD: Lipase: 154 U/L — ABNORMAL HIGH (ref 11–51)

## 2020-04-07 LAB — APTT: aPTT: 42 seconds — ABNORMAL HIGH (ref 24–36)

## 2020-04-07 MED ORDER — ONDANSETRON HCL 4 MG/2ML IJ SOLN
4.0000 mg | Freq: Once | INTRAMUSCULAR | Status: AC
  Administered 2020-04-07: 4 mg via INTRAVENOUS
  Filled 2020-04-07: qty 2

## 2020-04-07 MED ORDER — PANTOPRAZOLE SODIUM 40 MG IV SOLR: 40.0000 mg | Freq: Once | INTRAVENOUS | Status: DC

## 2020-04-07 MED ORDER — LACTATED RINGERS IV SOLN: INTRAVENOUS | Status: AC

## 2020-04-07 MED ORDER — MORPHINE SULFATE (PF) 4 MG/ML IV SOLN
4.0000 mg | Freq: Once | INTRAVENOUS | Status: AC
  Administered 2020-04-07: 4 mg via INTRAVENOUS
  Filled 2020-04-07: qty 1

## 2020-04-07 MED ORDER — METRONIDAZOLE IN NACL 5-0.79 MG/ML-% IV SOLN
500.0000 mg | Freq: Once | INTRAVENOUS | Status: AC
  Administered 2020-04-07: 500 mg via INTRAVENOUS
  Filled 2020-04-07: qty 100

## 2020-04-07 MED ORDER — METRONIDAZOLE IN NACL 5-0.79 MG/ML-% IV SOLN
500.0000 mg | Freq: Three times a day (TID) | INTRAVENOUS | Status: DC
  Administered 2020-04-08 – 2020-04-09 (×5): 500 mg via INTRAVENOUS
  Filled 2020-04-07 (×6): qty 100

## 2020-04-07 MED ORDER — SODIUM CHLORIDE 0.9 % IV SOLN
8.0000 mg/h | INTRAVENOUS | Status: DC
  Administered 2020-04-08 – 2020-04-10 (×6): 8 mg/h via INTRAVENOUS
  Filled 2020-04-07 (×8): qty 80

## 2020-04-07 MED ORDER — LACTATED RINGERS IV BOLUS (SEPSIS)
1000.0000 mL | Freq: Once | INTRAVENOUS | Status: AC
  Administered 2020-04-07: 1000 mL via INTRAVENOUS

## 2020-04-07 MED ORDER — GABAPENTIN 100 MG PO CAPS
100.0000 mg | ORAL_CAPSULE | Freq: Once | ORAL | Status: AC
  Administered 2020-04-07: 100 mg via ORAL
  Filled 2020-04-07: qty 1

## 2020-04-07 MED ORDER — ACETAMINOPHEN 500 MG PO TABS
1000.0000 mg | ORAL_TABLET | Freq: Once | ORAL | Status: AC
  Administered 2020-04-07: 1000 mg via ORAL
  Filled 2020-04-07: qty 2

## 2020-04-07 MED ORDER — IOHEXOL 350 MG/ML SOLN
100.0000 mL | Freq: Once | INTRAVENOUS | Status: AC | PRN
  Administered 2020-04-07: 100 mL via INTRAVENOUS

## 2020-04-07 MED ORDER — SODIUM CHLORIDE 0.9 % IV SOLN
2.0000 g | Freq: Three times a day (TID) | INTRAVENOUS | Status: DC
  Administered 2020-04-08 – 2020-04-09 (×5): 2 g via INTRAVENOUS
  Filled 2020-04-07 (×8): qty 2

## 2020-04-07 MED ORDER — SODIUM CHLORIDE 0.9 % IV SOLN
2.0000 g | Freq: Once | INTRAVENOUS | Status: AC
  Administered 2020-04-07: 2 g via INTRAVENOUS
  Filled 2020-04-07: qty 2

## 2020-04-07 MED ORDER — VANCOMYCIN HCL IN DEXTROSE 1-5 GM/200ML-% IV SOLN
1000.0000 mg | Freq: Once | INTRAVENOUS | Status: DC
  Administered 2020-04-07: 1000 mg via INTRAVENOUS
  Filled 2020-04-07: qty 200

## 2020-04-07 MED ORDER — SODIUM CHLORIDE 0.9 % IV SOLN
80.0000 mg | Freq: Once | INTRAVENOUS | Status: AC
  Administered 2020-04-08: 80 mg via INTRAVENOUS
  Filled 2020-04-07 (×2): qty 80

## 2020-04-07 MED ORDER — PANTOPRAZOLE SODIUM 40 MG IV SOLR: 40.0000 mg | Freq: Two times a day (BID) | INTRAVENOUS | Status: DC

## 2020-04-07 MED ORDER — ENOXAPARIN SODIUM 40 MG/0.4ML ~~LOC~~ SOLN
40.0000 mg | SUBCUTANEOUS | Status: DC
  Administered 2020-04-08 – 2020-04-10 (×3): 40 mg via SUBCUTANEOUS
  Filled 2020-04-07 (×3): qty 0.4

## 2020-04-07 MED ORDER — VANCOMYCIN HCL 10 G IV SOLR
2500.0000 mg | Freq: Once | INTRAVENOUS | Status: AC
  Administered 2020-04-07: 2500 mg via INTRAVENOUS
  Filled 2020-04-07: qty 2500

## 2020-04-07 MED ORDER — LACTATED RINGERS IV BOLUS (SEPSIS)
200.0000 mL | Freq: Once | INTRAVENOUS | Status: AC
  Administered 2020-04-07: 200 mL via INTRAVENOUS

## 2020-04-07 MED ORDER — VANCOMYCIN HCL 1500 MG/300ML IV SOLN
1500.0000 mg | Freq: Two times a day (BID) | INTRAVENOUS | Status: DC
  Administered 2020-04-08 – 2020-04-09 (×3): 1500 mg via INTRAVENOUS
  Filled 2020-04-07 (×4): qty 300

## 2020-04-07 NOTE — Progress Notes (Signed)
Pharmacy Antibiotic Note  Alexis Avila is a 39 y.o. female admitted on 04/07/2020 with sepsis.  Pharmacy has been consulted for vancomycin and cefepime dosing.  Tmax 100.8, WBC 18.2, Lactic acid is 2.9.  Estimated AUC ~515.   Plan: Vancomycin 2,500mg  IV once Vancomycin 1,500mg  IV every 12 hours.  Goal trough 15-20 mcg/mL. Cefepime 2g every 8 hours Follow up with cultures, antibiotic de-escalation and LOT Monitor renal function and clinical progress  Height: 5\' 10"  (177.8 cm) Weight: 129.3 kg (285 lb 0.9 oz) IBW/kg (Calculated) : 68.5  Temp (24hrs), Avg:100.8 F (38.2 C), Min:100.8 F (38.2 C), Max:100.8 F (38.2 C)  Recent Labs  Lab 04/07/20 1327 04/07/20 1328  WBC  --  18.2*  LATICACIDVEN 2.9*  --     CrCl cannot be calculated (Patient's most recent lab result is older than the maximum 21 days allowed.).    Allergies  Allergen Reactions  . Nsaids Other (See Comments)    H/o gastric bypass, h/o ulcers  . Lactose Intolerance (Gi) Other (See Comments)  . Penicillins Hives    Tolerated ceftriaxone 02/2020    Antimicrobials this admission: Vancomycin 9/24>> Cefepime 9/24>> Metronidazole 9/24 x1  Dose adjustments this admission: N/a  Microbiology results: 9/24 BCx: pending 9/24 UCx: pending   Thank you for allowing pharmacy to be a part of this patient's care.  Mercy Riding, PharmD PGY1 Acute Care Pharmacy Resident Please refer to [facility] for unit-specific pharmacist

## 2020-04-07 NOTE — H&P (Addendum)
[facility] Admission History and Physical Service Pager: [phone]  Patient name: Alexis Avila Medical record number: [id number] Date of birth: 1980/09/26 Age: 39 y.o. Gender: female  Primary Care Provider: [name], MD Consultants: Surgery Code Status: FULL Preferred Emergency Contact: Jess Barters (significant other)  Chief Complaint: Fever  Assessment and Plan: Alexis Avila is a 39 y.o. female presenting with fever. PMH is significant for recent ex-lap on 8/2 for possible bowel perforation, s/p G tube and JP drain, pancreatic pseudocyst, hx of roux-en-y bypass in 2002, gastric ulcers, and b12 deficiency.   Sepsis  Patient presents for fever of 101.7 by home health nurse today. In the ED, patient found to be febrile to 100.8, tachycardic to 133 initially, and tachypneic to the 40s, so sepsis protocol was initiated. Workup in the ED revealed leukocytosis to 18.2 and lactic acid 2.9. Blood cultures x2 were obtained as well as UA/urine cultures. She was given 1L LR bolus x2 and then started on mIVF with LR @ 150cc/hr. She was also given vancomycin, cefepime and flagyl. Repeat lactic acid improved to 1.9 after fluid resuscitation. UA normal other than elevated specific gravity, CMP remarkable for alk phos 234 and albumin 1.9, lipase 154.  Most recent alk phos was collected during her last hospitalization on 8/14 and was 219.  Last collected lipase was when she presented to the emergency department in August on 8/7 and was 202.  CT abdomen pelvis revealed postsurgical changes from bariatric surgery with gastric wall edema and edema at gastrojejunostomy. Persistent inflammatory changes seen in the left upper quadrant with small residual fluid collection. Peripancreatic edema also seen, thought to be related to recent surgery vs pancreatitis. Of note, patient admitted 1 month ago with nearly identical presentation at which time she was treated with IV  antibiotics. No exact source was identified but she was discharged home on PO antibiotics after remaining fever free for 24h in the hospital. Since that time, she had her G-tube removed 9/1 and her JP drain removed 9/15.  On evaluation in the emergency department the patient's vital signs had improved, she was still tachycardic but in the low 100s after her fluid resuscitation, she was no longer tachypneic, and her blood pressures were within normal limits. Source of infection remains unclear at this point but likely intra-abdominal focus of infection vs. cellulitis of LLQ abdominal wound site given increased drainage from area and surrounding induration on exam. CXR and UA not concerning for respiratory or urinary source. -Admit to FPTS, attending Dr. Nori Riis -Surgery will follow, appreciate recommendations -Plan for upper GI series tomorrow per surgery -Continue vanc, cefepime, flagyl -Follow up blood cultures, urine cultures -Trend fever curve -am CBC, CMP -mIVF: LR @ 150cc/hr  Tachypnea Patient presented with tachypnea to the 40s initially. CXR demonstrated low lung volumes with bibasilar atelectasis and/or infiltrates, unchanged from prior studies. Due to hx of recent surgery and concern for PE, CTA was obtained which did not show any evidence of PE.  Physical exam showed complete tenderness of lower extremities bilaterally but no point tenderness, no erythema, no edema.  Patient had very dry skin and dry mucous membranes.  Patient's tachypnea likely related to sepsis and volume depletion. RR has improved to the 20s after fluid resuscitation and initiation of abx. She denies SOB currently. Limited RVP negative for Influenza and COVID. -Continuous pulse ox -Monitor closely -DVT/PE ppx with Lovenox  Leg Tingling Patient reports intermittent, bilateral leg tingling ever since her surgery in August. Denies swelling,  redness or unilateral symptoms. Physical exam is unremarkable. Suspect neuropathy  that may improve over time. Symptoms are not consistent with DVT. -Gabapentin 131m TID prn -Reevaluate tomorrow to determine the effectiveness of Neurontin  Normocytic Anemia, Hx B12 deficiency, s/p gastric bypass Hemoglobin 10.5 today, which is improved from 9.2 one month ago upon discharge from the hospital. -am CBC  Marginal Ulcer Chronic, stable. Home meds include Carafate and Protonix -continue home medications  FEN/GI: NPO for upper GI series tomorrow Prophylaxis: Lovenox  Disposition: progressive  History of Present Illness:  Alexis Avila a 38y.o. female presenting with fever to 101.7 by home health nurse today. Upon interview, patient reports multiple complaints. She endorses intermittent fevers over the past 1 month and states her home health nurse often calls EMS due to fever however patient usually refuses transport to the ED. She also c/o intermittent SOB since her surgery in August. Patient had SOB yesterday evening but denies SOB currently. She notes abdominal pain, nausea, and "gas" that comes in waves. She previously was taking oxycodone and tramadol for her abdominal symptoms with slight relief. The abdominal pain seems to be localized to her LLQ around her prior drain site. Patient states she's had greenish drainage from the area. She also reports right sided chest pain and right sided abdominal pain which seem to be new in the past few days. Denies headache, nasal congestion, cough, vomiting, or diarrhea. Last BM yesterday which was normal.   Review Of Systems: Per HPI with the following additions:   Review of Systems  Constitutional: Positive for fever.  HENT: Negative for congestion, rhinorrhea and sore throat.   Eyes: Negative for visual disturbance.  Respiratory: Positive for shortness of breath. Negative for cough.   Gastrointestinal: Positive for abdominal pain. Negative for diarrhea and vomiting.  Genitourinary: Negative for dysuria, frequency and  urgency.  Skin: Positive for wound (with greenish drainage).  Neurological: Negative for headaches.     Patient Active Problem List   Diagnosis Date Noted  . Electrolyte abnormality 03/10/2020  . Fever   . Splenomegaly   . Sepsis ([facility] 02/26/2020  . Hyponatremia   . Intra-abdominal abscess ([facility]   . Poor venous access   . Hypophosphatemia   . SOB (shortness of breath)   . Lower extremity edema   . Dyspnea   . Status post exploratory laparotomy   . Abdominal pain   . Elevated LFTs   . Marginal ulcers   . History of Roux-en-Y gastric bypass   . Intractable nausea and vomiting 02/09/2020  . Hypokalemia   . Pseudocyst of pancreas   . SBO (small bowel obstruction) ([facility]   . History of pancreatitis 07/20/2019  . Status post gastric bypass for obesity 07/20/2019  . Anemia, B12 deficiency 07/20/2019  . Current smoker 07/20/2019  . Chronic back pain 07/20/2019  . Ovarian cyst 07/20/2019  . GERD (gastroesophageal reflux disease) 07/20/2019  . Morbid obesity ([facility] 07/20/2019    Past Medical History: Past Medical History:  Diagnosis Date  . Allergy   . Anemia     Past Surgical History: Past Surgical History:  Procedure Laterality Date  . APPLICATION OF WOUND VAC N/A 02/14/2020   Procedure: APPLICATION OF WOUND VAC;  Surgeon: WGreer Pickerel MD;  [facility]  Service: General;  Laterality: N/A;  . BIOPSY  02/10/2020   Procedure: BIOPSY;  Surgeon: CLavena Bullion DO;  [facility];  Service: Gastroenterology;;  . CL[facility] 2013   crystalized gallbaldder  .  DEBRIDEMENT OF ABDOMINAL WALL ABSCESS N/A 02/14/2020   Procedure: DRAINAGE OF INTRAABDOMINAL ABSCESS;  Surgeon: Greer Pickerel, MD;  Location: [place];  Service: General;  Laterality: N/A;  . ENTEROSCOPY N/A 02/10/2020   Procedure: ENTEROSCOPY;  Surgeon: Lavena Bullion, DO;  Location: [place];  Service: Gastroenterology;  Laterality: N/A;  . GASTRIC BYPASS  2002  . GASTROSTOMY N/A 02/14/2020    Procedure: INSERTION OF GASTROSTOMY TUBE;  Surgeon: Greer Pickerel, MD;  Location: [place];  Service: General;  Laterality: N/A;  . LAPAROSCOPIC [place] WITH UPPER ENDOSCOPY N/A 02/14/2020   Procedure: PARTIAL RESECTION OF ROUX LIMB;  Surgeon: Greer Pickerel, MD;  Location: [place];  Service: General;  Laterality: N/A;  . LAPAROTOMY N/A 02/14/2020   Procedure: EXPLORATORY LAPAROTOMY;  Surgeon: Greer Pickerel, MD;  Location: [place];  Service: General;  Laterality: N/A;  . LYSIS OF ADHESION N/A 02/14/2020   Procedure: LYSIS OF ADHESION >2 HOURS;  Surgeon: Greer Pickerel, MD;  Location: [facility] OR;  Service: General;  Laterality: N/A;    Social History: Social History   Tobacco Use  . Smoking status: Current Every Day Smoker    Packs/day: 0.50    Years: 5.00    Pack years: 2.50    Types: Cigarettes  . Smokeless tobacco: Never Used  Vaping Use  . Vaping Use: Never used  Substance Use Topics  . Alcohol use: Yes    Alcohol/week: 13.0 standard drinks    Types: 5 Glasses of wine, 4 Cans of beer, 4 Shots of liquor per week  . Drug use: Not on file    Family History: Family History  Problem Relation Age of Onset  . COPD Sister   . Heart disease Sister     Allergies and Medications: Allergies  Allergen Reactions  . Nsaids Other (See Comments)    H/o gastric bypass, h/o ulcers  . Lactose Intolerance (Gi) Other (See Comments)  . Penicillins Hives    Tolerated ceftriaxone 02/2020  Did it involve swelling of the face/tongue/throat, SOB, or low BP? N Did it involve sudden or severe rash/hives, skin peeling, or any reaction on the inside of your mouth or nose? N Did you need to seek medical attention at a hospital or doctor's office? N When did it last happen?Several Years Ago Itching If all above answers are "NO", may proceed with cephalosporin use.    No current facility-administered medications on file prior to encounter.   Current Outpatient Medications on File Prior to Encounter   Medication Sig Dispense Refill  . Multiple Vitamins-Minerals (MULTIVITAMIN WITH MINERALS) tablet Take 1 tablet by mouth daily. 30 tablet 2  . sucralfate (CARAFATE) 1 g tablet Take 1 tablet (1 g total) by mouth 4 (four) times daily as needed. (Patient taking differently: Take 1 g by mouth 4 (four) times daily. ) 60 tablet 0  . acetaminophen (TYLENOL) 160 MG/5ML solution Take 31.3 mLs (1,000 mg total) by mouth every 8 (eight) hours. (Patient not taking: Reported on 04/07/2020) 120 mL 0  . furosemide (LASIX) 20 MG tablet Take 1 tablet (20 mg total) by mouth daily as needed for up to 7 doses for fluid or edema. (Patient not taking: Reported on 04/07/2020) 7 tablet 0  . magnesium gluconate (MAGONATE) 30 MG tablet Take 1 tablet (30 mg total) by mouth 2 (two) times daily. (Patient not taking: Reported on 04/07/2020) 60 tablet 0  . oxycodone (OXY-IR) 5 MG capsule Take 1 capsule (5 mg total) by mouth every 4 (four) hours  as needed. (Patient not taking: Reported on 04/07/2020) 14 capsule 0  . pantoprazole (PROTONIX) 40 MG tablet Take 1 tablet (40 mg total) by mouth 2 (two) times daily. (Patient not taking: Reported on 04/07/2020) 120 tablet 2  . polyethylene glycol powder (GLYCOLAX/MIRALAX) 17 GM/SCOOP powder Take 17 g by mouth 2 (two) times daily as needed. (Patient not taking: Reported on 04/07/2020) 3350 g 1  . potassium chloride 20 MEQ TBCR Take 20 mEq by mouth daily as needed for up to 7 doses. Take this whenever you take Lasix. (Patient not taking: Reported on 04/07/2020) 7 tablet 0  . potassium phosphate, monobasic, (K-PHOS ORIGINAL) 500 MG tablet Take 2 tablets (1,000 mg total) by mouth 3 (three) times daily with meals. (Patient not taking: Reported on 04/07/2020) 90 tablet 0    Objective: BP 124/75   Pulse (!) 112   Temp 99.9 F (37.7 C) (Oral)   Resp (!) 32   Ht '5\' 10"'  (1.778 m)   Wt 129.3 kg   LMP 02/04/2020 (Exact Date)   SpO2 97%   BMI 40.90 kg/m  Exam: General: alert, non-toxic appearing,  NAD Head: Normocephalic, atraumatic, dry mucous membranes Eyes: normal conjunctiva and sclera Neck: no cervical lymphadenopathy Cardiovascular: tachycardic, normal S1/S2 without m/r/g Respiratory: normal WOB on room air, decreased BS at bilateral bases, lungs otherwise CTAB Gastrointestinal: +BS, soft, nontender, nondistended Ext: 2+ distal pulses, no peripheral edema Derm: Patient has dry skin large midline incision with granulation tissue appears to be healing well without surrounding erythema, induration, tenderness or drainage. LLQ drain site tender to palpation with slight surrounding induration. Neuro: grossly intact, 5/5 strength in b/l upper and lower extremities Psych: appropriate affect, normal speech, oriented x3  Labs and Imaging: CBC BMET  Recent Labs  Lab 04/07/20 1328  WBC 18.2*  HGB 10.5*  HCT 35.6*  PLT 413*   Recent Labs  Lab 04/07/20 1600  NA 136  K 3.5  CL 108  CO2 19*  BUN <5*  CREATININE 0.54  GLUCOSE 137*  CALCIUM 9.6     EKG: My own interpretation (not copied from electronic read): sinus tachycardia at 138bpm, normal axis, no ST-T changes    DG Chest 2 View Result Date: 04/07/2020 IMPRESSION: Low lung volumes with bibasilar atelectasis and or infiltrates again noted. Similar findings noted on prior exam. No definite pleural effusion noted on today's exam.   CT Angio Chest PE W/Cm &/Or Wo Cm CT Abdomen Pelvis w/Contrast Result Date: 04/07/2020 IMPRESSION: 1. No evidence of pulmonary embolus.  2. Postsurgical changes from bariatric surgery, with gastric wall edema and edema at the gastrojejunostomy. Evaluation of the anastomosis is limited without oral contrast.  3. Persistent inflammatory changes within the left upper quadrant, with subcentimeter residual fluid collection as above.  4. Peripancreatic edema is felt to be related to recent abdominal surgery, though pancreatitis cannot be completely excluded. Please correlate with laboratory analysis.    CT ABDOMEN PELVIS W CONTRAST Addendum Date: 04/07/2020   ADDENDUM REPORT: 04/07/2020 20:52 ADDENDUM: Agree with Dr. Saul Fordyce assessment. Case was further discussed with PA Hammon as follows: Interval increase in marked left upper quadrant inflammatory changes with worsened inflammation of the fundus/body of the bypassed protion of the stomach as well as the distal bypassed dudoenum/jejunum just proximal to the jejunojejunostomy. Findings suggestive of a marginal ulcer formation. Cannot exclude gastric/small bowel leak. Associated reactive changes of the splenic flexure/proximal transverse colon. Enteric-enteric fistula in the left upper abdomen between the biliopancreatic limb and proximal transverse colon  not excluded. Interval increase in distal pancreatic body contour haziness and peripancreatic fluid. Findings likely representative of reactive inflammation; however, acute pancreatitis cannot be excluded. Correlate with lipase levels. No drainable organized fluid collection. Retained LUQ prior surgical drain tract. If subsequent CT abdomen pelvis obtained, consider water-soluble PO contrast for further evaluation. Tiny fat containing incisional hernia with an abdominal defect of 0.6 cm. Healing abdominal incision. No subcutaneous soft tissue organized fluid collection or definite phlegmon. No pneumoperitoneum. No bowel obstruction. These results were called by telephone at the time of interpretation on 04/07/2020 at 7:49 pm to provider Tamala Julian PA , who verbally acknowledged these results. Electronically Signed   By: Iven Finn M.D.   On: 04/07/2020 20:52     Alcus Dad, MD 04/07/2020, 10:58 PM PGY-1, [place] Intern pager: [phone], text pages welcome  FPTS Upper-Level Resident Addendum   I have independently interviewed and examined the patient. I have discussed the above with the original author and agree with their documentation. My edits for  correction/addition/clarification are in blue.Please see also any attending notes.   Gifford Shave, MD PGY-2, [facility] 04/07/2020 11:12 PM  [place] Service pager: [phone] (text pages welcome through [place])

## 2020-04-07 NOTE — Consult Note (Signed)
Reason for Consult: Abdominal pain Referring Physician: Gareth Morgan, MD  Alexis Avila is an 38 y.o. female with PMH of roux-en-Y gastric bypass (2002), recent exploratory laparotomy, small bowel resection of Roux limb and LUQ abscess drainage (02/2020) with Dr. [name] that is presenting with abdominal pain, nausea and fevers.   Patient reports that she has been having intermittent fevers and chills for the last couple of weeks. She came in today due to ongoing, worsening abdominal pain. G-tube was removed on 9/1 and prior LUQ drain was removed last week by Dr. [name]. She denies NSAID use. She was a prior smoker but quit in July. Denies pain after eating. She is passing flatus and having non-bloody BMs. She has been changing midline wound dressing daily. Reports increase drainage from prior drain site, requiring 4-5 times per day dressing changes.    In the ED, she is tachycardic, normotensive. Labs remarkable for WBC 18.2, mildly elevated lipase 154. CT chest PE protocol perform with no evidence of PE. CT abd/pelv with IV contrast completed with findings concerning for stranding around [place] anastomosis, likely related to recent surgery, no fluid collection seen, no free air or free fluid, no bowel obstruction.  Past Medical History:  Diagnosis Date  . Allergy   . Anemia     Past Surgical History:  Procedure Laterality Date  . APPLICATION OF WOUND VAC N/A 02/14/2020   Procedure: APPLICATION OF WOUND VAC;  Surgeon: Greer Pickerel, MD;  Location: [facility];  Service: General;  Laterality: N/A;  . BIOPSY  02/10/2020   Procedure: BIOPSY;  Surgeon: Lavena Bullion, DO;  Location: [facility];  Service: Gastroenterology;;  . Lorin [facility]  2013   crystalized gallbaldder  . DEBRIDEMENT OF ABDOMINAL WALL ABSCESS N/A 02/14/2020   Procedure: DRAINAGE OF INTRAABDOMINAL ABSCESS;  Surgeon: Greer Pickerel, MD;  Location: [facility];  Service: General;  Laterality: N/A;  . ENTEROSCOPY N/A 02/10/2020    Procedure: ENTEROSCOPY;  Surgeon: Lavena Bullion, DO;  Location: [facility];  Service: Gastroenterology;  Laterality: N/A;  . GASTRIC BYPASS  2002  . GASTROSTOMY N/A 02/14/2020   Procedure: INSERTION OF GASTROSTOMY TUBE;  Surgeon: Greer Pickerel, MD;  Location: [facility];  Service: General;  Laterality: N/A;  . LAPAROSCOPIC [place] WITH UPPER ENDOSCOPY N/A 02/14/2020   Procedure: PARTIAL RESECTION OF ROUX LIMB;  Surgeon: Greer Pickerel, MD;  Location: [facility];  Service: General;  Laterality: N/A;  . LAPAROTOMY N/A 02/14/2020   Procedure: EXPLORATORY LAPAROTOMY;  Surgeon: Greer Pickerel, MD;  Location: [facility];  Service: General;  Laterality: N/A;  . LYSIS OF ADHESION N/A 02/14/2020   Procedure: LYSIS OF ADHESION >2 HOURS;  Surgeon: Greer Pickerel, MD;  Location: [facility];  Service: General;  Laterality: N/A;    Family History  Problem Relation Age of Onset  . COPD Sister   . Heart disease Sister     Social History:  reports that she has been smoking cigarettes. She has a 2.50 pack-year smoking history. She has never used smokeless tobacco. She reports current alcohol use of about 13.0 standard drinks of alcohol per week. No history on file for drug use.  Allergies:  Allergies  Allergen Reactions  . Nsaids Other (See Comments)    H/o gastric bypass, h/o ulcers  . Lactose Intolerance (Gi) Other (See Comments)  . Penicillins Hives    Tolerated ceftriaxone 02/2020  Did it involve swelling of the face/tongue/throat, SOB, or low BP? N Did it involve sudden or severe rash/hives, skin peeling,  or any reaction on the inside of your mouth or nose? N Did you need to seek medical attention at a hospital or doctor's office? N When did it last happen?Several Years Ago Itching If all above answers are "NO", may proceed with cephalosporin use.     Medications: I have reviewed the patient's current medications.  Results for orders placed or performed during the hospital encounter of 04/07/20 (from  the past 48 hour(s))  Lactic acid, plasma     Status: Abnormal   Collection Time: 04/07/20  1:27 PM  Result Value Ref Range   Lactic Acid, Venous 2.9 (HH) 0.5 - 1.9 mmol/L    Comment: CRITICAL RESULT CALLED TO, READ BACK BY AND VERIFIED WITH: LOUIE CHILTON,RN AT 5277 04/07/2020 BY ZBEECH. Performed at [facility], [place] [address], [place], [place] [address]   CBC with Differential     Status: Abnormal   Collection Time: 04/07/20  1:28 PM  Result Value Ref Range   WBC 18.2 (H) 4.0 - 10.5 K/uL   RBC 4.11 3.87 - 5.11 MIL/uL   Hemoglobin 10.5 (L) 12.0 - 15.0 g/dL   HCT 35.6 (L) 36 - 46 %   MCV 86.6 80.0 - 100.0 fL   MCH 25.5 (L) 26.0 - 34.0 pg   MCHC 29.5 (L) 30.0 - 36.0 g/dL   RDW 19.8 (H) 11.5 - 15.5 %   Platelets 413 (H) 150 - 400 K/uL   nRBC 0.0 0.0 - 0.2 %   Neutrophils Relative % 87 %   Neutro Abs 15.9 (H) 1.7 - 7.7 K/uL   Lymphocytes Relative 9 %   Lymphs Abs 1.7 0.7 - 4.0 K/uL   Monocytes Relative 3 %   Monocytes Absolute 0.5 0 - 1 K/uL   Eosinophils Relative 0 %   Eosinophils Absolute 0.0 0 - 0 K/uL   Basophils Relative 0 %   Basophils Absolute 0.0 0 - 0 K/uL   Immature Granulocytes 1 %   Abs Immature Granulocytes 0.11 (H) 0.00 - 0.07 K/uL    Comment: Performed at [facility], [address] [place], [place] [address]  I-Stat beta hCG blood, ED     Status: None   Collection Time: 04/07/20  1:50 PM  Result Value Ref Range   I-stat hCG, quantitative <5.0 <5 mIU/mL   Comment 3            Comment:   GEST. AGE      CONC.  (mIU/mL)   <=1 WEEK        5 - 50     2 WEEKS       50 - 500     3 WEEKS       100 - 10,000     4 WEEKS     1,000 - 30,000        FEMALE AND NON-PREGNANT FEMALE:     LESS THAN 5 mIU/mL   Lactic acid, plasma     Status: None   Collection Time: 04/07/20  4:00 PM  Result Value Ref Range   Lactic Acid, Venous 1.9 0.5 - 1.9 mmol/L    Comment: Performed at [facility] [address], [place], [place] [address]  Protime-INR      Status: Abnormal   Collection Time: 04/07/20  4:00 PM  Result Value Ref Range   Prothrombin Time 18.7 (H) 11.4 - 15.2 seconds   INR 1.6 (H) 0.8 - 1.2    Comment: (NOTE) INR  goal varies based on device and disease states. Performed at [facility], [place] [address] [place], [place] [address]   Comprehensive metabolic panel     Status: Abnormal   Collection Time: 04/07/20  4:00 PM  Result Value Ref Range   Sodium 136 135 - 145 mmol/L   Potassium 3.5 3.5 - 5.1 mmol/L   Chloride 108 98 - 111 mmol/L   CO2 19 (L) 22 - 32 mmol/L   Glucose, Bld 137 (H) 70 - 99 mg/dL    Comment: Glucose reference range applies only to samples taken after fasting for at least 8 hours.   BUN <5 (L) 6 - 20 mg/dL   Creatinine, Ser 0.54 0.44 - 1.00 mg/dL   Calcium 9.6 8.9 - 10.3 mg/dL   Total Protein 6.4 (L) 6.5 - 8.1 g/dL   Albumin 1.9 (L) 3.5 - 5.0 g/dL   AST 16 15 - 41 U/L   ALT 9 0 - 44 U/L   Alkaline Phosphatase 234 (H) 38 - 126 U/L   Total Bilirubin 1.0 0.3 - 1.2 mg/dL   GFR calc non Af Amer >60 >60 mL/min   GFR calc Af Amer >60 >60 mL/min   Anion gap 9 5 - 15    Comment: Performed at [facility] [address], [place], [place] [address]  APTT     Status: Abnormal   Collection Time: 04/07/20  4:00 PM  Result Value Ref Range   aPTT 42 (H) 24 - 36 seconds    Comment:        IF BASELINE aPTT IS ELEVATED, SUGGEST PATIENT RISK ASSESSMENT BE USED TO DETERMINE APPROPRIATE ANTICOAGULANT THERAPY. Performed at [facility], [place] [address], [place], [place] [address]   Lipase, blood     Status: Abnormal   Collection Time: 04/07/20  4:00 PM  Result Value Ref Range   Lipase 154 (H) 11 - 51 U/L    Comment: Performed at [facility] [address], [place], [place] [address]  Respiratory Panel by RT PCR (Flu A&B, Covid) - Nasopharyngeal Swab     Status: None   Collection Time: 04/07/20  4:00 PM   Specimen: Nasopharyngeal Swab  Result Value Ref Range   SARS Coronavirus 2 by RT  PCR NEGATIVE NEGATIVE    Comment: (NOTE) SARS-CoV-2 target nucleic acids are NOT DETECTED.  The SARS-CoV-2 RNA is generally detectable in upper respiratoy specimens during the acute phase of infection. The lowest concentration of SARS-CoV-2 viral copies this assay can detect is 131 copies/mL. A negative result does not preclude SARS-Cov-2 infection and should not be used as the sole basis for treatment or other patient management decisions. A negative result may occur with  improper specimen collection/handling, submission of specimen other than nasopharyngeal swab, presence of viral mutation(s) within the areas targeted by this assay, and inadequate number of viral copies (<131 copies/mL). A negative result must be combined with clinical observations, patient history, and epidemiological information. The expected result is Negative.  Fact Sheet for Patients:  PinkCheek.be  Fact Sheet for Healthcare Providers:  GravelBags.it  This test is no t yet approved or cleared by the [place] FDA and  has been authorized for detection and/or diagnosis of SARS-CoV-2 by FDA under an Emergency Use Authorization (EUA). This EUA will remain  in effect (meaning this test can be used) for the duration of the COVID-19 declaration under Section 564(b)(1) of the Act, 21 U.S.C. section 360bbb-3(b)(1), unless the  authorization is terminated or revoked sooner.     Influenza A by PCR NEGATIVE NEGATIVE   Influenza B by PCR NEGATIVE NEGATIVE    Comment: (NOTE) The Xpert Xpress SARS-CoV-2/FLU/RSV assay is intended as an aid in  the diagnosis of influenza from Nasopharyngeal swab specimens and  should not be used as a sole basis for treatment. Nasal washings and  aspirates are unacceptable for Xpert Xpress SARS-CoV-2/FLU/RSV  testing.  Fact Sheet for Patients: PinkCheek.be  Fact Sheet for Healthcare  Providers: GravelBags.it  This test is not yet approved or cleared by the [place] FDA and  has been authorized for detection and/or diagnosis of SARS-CoV-2 by  FDA under an Emergency Use Authorization (EUA). This EUA will remain  in effect (meaning this test can be used) for the duration of the  Covid-19 declaration under Section 564(b)(1) of the Act, 21  U.S.C. section 360bbb-3(b)(1), unless the authorization is  terminated or revoked. Performed at [facility], [place] [address] [place], [place] [address]     DG Chest 2 View  Result Date: 04/07/2020 CLINICAL DATA:  Possible sepsis. EXAM: CHEST - 2 VIEW COMPARISON:  02/25/2020. FINDINGS: Mediastinum and hilar structures normal. Low lung volumes with bibasilar atelectasis/infiltrates again noted. Similar findings noted on prior exam. No definite pleural effusion noted on today's exam. No pneumothorax. Degenerative change thoracic spine. IMPRESSION: Low lung volumes with bibasilar atelectasis and or infiltrates again noted. Similar findings noted on prior exam. No definite pleural effusion noted on today's exam. Electronically Signed   By: [place]   On: 04/07/2020 14:06   CT Angio Chest PE W/Cm &/Or Wo Cm  Result Date: 04/07/2020 CLINICAL DATA:  Sepsis, recent bariatric surgery EXAM: CT ANGIOGRAPHY CHEST CT ABDOMEN AND PELVIS WITH CONTRAST TECHNIQUE: Multidetector CT imaging of the chest was performed using the standard protocol during bolus administration of intravenous contrast. Multiplanar CT image reconstructions and MIPs were obtained to evaluate the vascular anatomy. Multidetector CT imaging of the abdomen and pelvis was performed using the standard protocol during bolus administration of intravenous contrast. CONTRAST:  122mL OMNIPAQUE IOHEXOL 350 MG/ML SOLN COMPARISON:  03/24/2020 FINDINGS: CTA CHEST FINDINGS Cardiovascular: This is a technically adequate evaluation of the pulmonary  vasculature. No filling defects or pulmonary emboli. The heart is unremarkable without pericardial effusion. Normal caliber of the thoracic aorta. Mediastinum/Nodes: No enlarged mediastinal, hilar, or axillary lymph nodes. Thyroid gland, trachea, and esophagus demonstrate no significant findings. Lungs/Pleura: Scattered areas of atelectasis are seen within the right middle and bilateral lower lobes. No airspace disease, effusion, or pneumothorax. Central airways are patent. Musculoskeletal: No acute or destructive bony lesions. Reconstructed images demonstrate no additional findings. Review of the MIP images confirms the above findings. CT ABDOMEN and PELVIS FINDINGS Hepatobiliary: Gallbladder is surgically absent. The liver enhances normally. No biliary dilation. Pancreas: Pancreas enhances normally. Peripancreatic edema is felt to be related to recent abdominal surgery, though pancreatitis cannot be completely excluded. Please correlate with laboratory analysis. Spleen: Borderline splenomegaly is unchanged. Adrenals/Urinary Tract: Adrenal glands are unremarkable. Kidneys are normal, without renal calculi, focal lesion, or hydronephrosis. Bladder is unremarkable. Stomach/Bowel: Evaluation of the bowel and postsurgical abdomen is limited without oral contrast. Postsurgical changes are seen from bariatric surgery with staple line at the gastric fundus. Wall thickening is seen within the excluded portion of the gastric fundus. There is edema at the gastrojejunostomy, and evaluation is limited without oral contrast. The percutaneous gastrostomy tube seen previously has been removed in the interim. Continued inflammatory  changes within the left upper quadrant mesentery, at the site of previous gas/fluid collection. Small residual area of fluid in the left upper quadrant image 36 measures 0.9 cm in diameter. Mild dilation of the small bowel at the jejunojejunostomy site left lower quadrant. Again, evaluation of the  anastomosis is limited without oral contrast. There is no evidence of bowel obstruction. Normal appendix right lower quadrant. Vascular/Lymphatic: Multiple subcentimeter lymph nodes are seen within the central upper abdominal mesentery, reactive. No pathologically enlarged lymph nodes. No significant vascular findings. Reproductive: Exophytic uterine fibroid unchanged. No adnexal masses. Other: Trace free fluid in the bilateral paracolic gutters. No free intraperitoneal gas. Postsurgical changes are seen from prior midline laparotomy, with small fat containing peri-incisional hernia unchanged. No abdominal wall fluid collection. No bowel herniation. Musculoskeletal: No acute or destructive bony lesions. Reconstructed images demonstrate no additional findings. Review of the MIP images confirms the above findings. IMPRESSION: 1. No evidence of pulmonary embolus. 2. Postsurgical changes from bariatric surgery, with gastric wall edema and edema at the gastrojejunostomy. Evaluation of the anastomosis is limited without oral contrast. 3. Persistent inflammatory changes within the left upper quadrant, with subcentimeter residual fluid collection as above. 4. Peripancreatic edema is felt to be related to recent abdominal surgery, though pancreatitis cannot be completely excluded. Please correlate with laboratory analysis. Electronically Signed   By: Randa Ngo M.D.   On: 04/07/2020 19:00    Review of Systems  Constitutional: Positive for chills and fever.  Respiratory: Negative.   Cardiovascular: Negative.   Gastrointestinal: Positive for abdominal pain and vomiting. Negative for blood in stool and diarrhea.  Genitourinary: Negative.   Musculoskeletal: Negative.   Skin: Negative.     PE Blood pressure 124/75, pulse (!) 112, temperature 99.9 F (37.7 C), temperature source Oral, resp. rate (!) 32, height 5\' 10"  (1.778 m), weight 129.3 kg, last menstrual period 02/04/2020, SpO2 97 %. Constitutional:  conversant, mild discomfort Eyes: Moist conjunctiva; no lid lag; anicteric; PERRL Neck: Trachea midline Lungs: Normal respiratory effort CV: Tachycardic GI: Abd is soft, mildly distended, diffusely tender, no peritonitis, midline incision with pink granulation tissue, LUQ drain site painful to touch with drainage on dressing, prior G-tube site almost healed Psychiatric: Appropriate affect; alert and oriented x3    Imaging:  CT CAP: FINDINGS: CTA CHEST FINDINGS  Cardiovascular: This is a technically adequate evaluation of the pulmonary vasculature. No filling defects or pulmonary emboli.  The heart is unremarkable without pericardial effusion. Normal caliber of the thoracic aorta.  Mediastinum/Nodes: No enlarged mediastinal, hilar, or axillary lymph nodes. Thyroid gland, trachea, and esophagus demonstrate no significant findings.  Lungs/Pleura: Scattered areas of atelectasis are seen within the right middle and bilateral lower lobes. No airspace disease, effusion, or pneumothorax. Central airways are patent.  Musculoskeletal: No acute or destructive bony lesions. Reconstructed images demonstrate no additional findings.  Review of the MIP images confirms the above findings.  CT ABDOMEN and PELVIS FINDINGS  Hepatobiliary: Gallbladder is surgically absent. The liver enhances normally. No biliary dilation.  Pancreas: Pancreas enhances normally. Peripancreatic edema is felt to be related to recent abdominal surgery, though pancreatitis cannot be completely excluded. Please correlate with laboratory analysis.  Spleen: Borderline splenomegaly is unchanged.  Adrenals/Urinary Tract: Adrenal glands are unremarkable. Kidneys are normal, without renal calculi, focal lesion, or hydronephrosis. Bladder is unremarkable.  Stomach/Bowel: Evaluation of the bowel and postsurgical abdomen is limited without oral contrast. Postsurgical changes are seen from bariatric surgery  with staple line at the gastric fundus. Wall thickening  is seen within the excluded portion of the gastric fundus. There is edema at the gastrojejunostomy, and evaluation is limited without oral contrast. The percutaneous gastrostomy tube seen previously has been removed in the interim. Continued inflammatory changes within the left upper quadrant mesentery, at the site of previous gas/fluid collection. Small residual area of fluid in the left upper quadrant image 36 measures 0.9 cm in diameter.  Mild dilation of the small bowel at the jejunojejunostomy site left lower quadrant. Again, evaluation of the anastomosis is limited without oral contrast.  There is no evidence of bowel obstruction. Normal appendix right lower quadrant.  Vascular/Lymphatic: Multiple subcentimeter lymph nodes are seen within the central upper abdominal mesentery, reactive. No pathologically enlarged lymph nodes. No significant vascular findings.  Reproductive: Exophytic uterine fibroid unchanged. No adnexal masses.  Other: Trace free fluid in the bilateral paracolic gutters. No free intraperitoneal gas.  Postsurgical changes are seen from prior midline laparotomy, with small fat containing peri-incisional hernia unchanged. No abdominal wall fluid collection. No bowel herniation.  Musculoskeletal: No acute or destructive bony lesions. Reconstructed images demonstrate no additional findings.  Review of the MIP images confirms the above findings.  IMPRESSION: 1. No evidence of pulmonary embolus. 2. Postsurgical changes from bariatric surgery, with gastric wall edema and edema at the gastrojejunostomy. Evaluation of the anastomosis is limited without oral contrast. 3. Persistent inflammatory changes within the left upper quadrant, with subcentimeter residual fluid collection as above. 4. Peripancreatic edema is felt to be related to recent abdominal surgery, though pancreatitis cannot be  completely excluded. Please correlate with laboratory analysis.   Assessment/Plan: 24F with PMH of roux-en-Y gastric bypass (2002), recent exploratory laparotomy, small bowel resection of Roux limb and LUQ abscess drainage (02/2020) with Dr. [name] that is presenting with abdominal pain, nausea and fevers. On exam, diffuse abdominal pain, but no peritonitis. CT abd/pelv with no evidence of drainage collection, SBO, free air or ischemia. Unclear of source of ongoing abdominal pain, no drainable collection, there is some stranding on around [place] but could be related to post-surgical changes. Mildly elevated lipase likely related to surrounding inflammation from prior collection in LUQ. No symptoms of marginal ulcer, but does have h/o of prior marginal ulcer on endoscopy from 01/2020. Denies taking NSAIDs or smoking.    -keep NPO -plan for upper GI tomorrow to evaluate anastomosis -broad spectrum antibiotics -f/u blood and urine cultures -protonix drip  -wound care to midline with moist gauze and gauze   Daiva Huge, MD 04/07/2020, 7:53 PM

## 2020-04-07 NOTE — ED Provider Notes (Addendum)
[facility] EMERGENCY DEPARTMENT Provider Note   CSN: [id number] Arrival date & time: 04/07/20  1318     History Chief Complaint  Patient presents with  . Blood Infection    Alexis Avila is a 39 y.o. female with pertinent past medical history of partial SBO, bowel perforation and intra-abdominal hematoma/abscess s/p exploratory laparotomy, s/p G tube and JP tube, gastric ulcers presents to the ER for evaluation of fever.  Patient tells me that her home nurse went to see her today and her fever was 100.7, she was told to come to the ER for evaluation.  Patient tells me last night she developed shortness of breath described as "feeling winded".  Denies any congestion, sore throat, cough or chest pain.  On vaccinated for COVID-19.  She tells me that her JP drain was removed September 1.  She has had gradual but worsening discomfort along this skin incision with "creamy" drainage coming out of the incision with certain position movements, lifting, coughing.  She feels like this is slightly worse.  Her pain around the site has also worsened since the drain was removed.  She had a J-tube pulled last Wednesday and reports milder discomfort around that site but no drainage.  She is concerned about the JP site.  Was told by Dr. [name] to come to the ER and get a scan.  Denies any nausea, vomiting.  No diarrhea or constipation.  No dysuria or urinary symptoms.  No interventions.  No other associated symptoms.  HPI     Past Medical History:  Diagnosis Date  . Allergy   . Anemia     Patient Active Problem List   Diagnosis Date Noted  . Electrolyte abnormality 03/10/2020  . Fever   . Splenomegaly   . Sepsis ([place]) 02/26/2020  . Hyponatremia   . Intra-abdominal abscess ([place])   . Poor venous access   . Hypophosphatemia   . SOB (shortness of breath)   . Lower extremity edema   . Dyspnea   . Status post exploratory laparotomy   . Abdominal pain   . Elevated LFTs   .  Marginal ulcers   . History of Roux-en-Y gastric bypass   . Intractable nausea and vomiting 02/09/2020  . Hypokalemia   . Pseudocyst of pancreas   . SBO (small bowel obstruction) ([place])   . History of pancreatitis 07/20/2019  . Status post gastric bypass for obesity 07/20/2019  . Anemia, B12 deficiency 07/20/2019  . Current smoker 07/20/2019  . Chronic back pain 07/20/2019  . Ovarian cyst 07/20/2019  . GERD (gastroesophageal reflux disease) 07/20/2019  . Morbid obesity ([place]) 07/20/2019    Past Surgical History:  Procedure Laterality Date  . APPLICATION OF WOUND VAC N/A 02/14/2020   Procedure: APPLICATION OF WOUND VAC;  Surgeon: Greer Pickerel, MD;  Location: [facility];  Service: General;  Laterality: N/A;  . BIOPSY  02/10/2020   Procedure: BIOPSY;  Surgeon: Lavena Bullion, DO;  Location: [facility];  Service: Gastroenterology;;  . [facility]in [facility]  2013   crystalized gallbaldder  . DEBRIDEMENT OF ABDOMINAL WALL ABSCESS N/A 02/14/2020   Procedure: DRAINAGE OF INTRAABDOMINAL ABSCESS;  Surgeon: Greer Pickerel, MD;  Location: [place];  Service: General;  Laterality: N/A;  . ENTEROSCOPY N/A 02/10/2020   Procedure: ENTEROSCOPY;  Surgeon: Lavena Bullion, DO;  Location: [facility];  Service: Gastroenterology;  Laterality: N/A;  . GASTRIC BYPASS  2002  . GASTROSTOMY N/A 02/14/2020   Procedure: INSERTION OF GASTROSTOMY TUBE;  Surgeon: [name],  Randall Hiss, MD;  Location: [place];  Service: General;  Laterality: N/A;  . LAPAROSCOPIC [place] WITH UPPER ENDOSCOPY N/A 02/14/2020   Procedure: PARTIAL RESECTION OF ROUX LIMB;  Surgeon: Greer Pickerel, MD;  Location: [facility];  Service: General;  Laterality: N/A;  . LAPAROTOMY N/A 02/14/2020   Procedure: EXPLORATORY LAPAROTOMY;  Surgeon: Greer Pickerel, MD;  Location: [place];  Service: General;  Laterality: N/A;  . LYSIS OF ADHESION N/A 02/14/2020   Procedure: LYSIS OF ADHESION >2 HOURS;  Surgeon: Greer Pickerel, MD;  Location: [facility];  Service: General;  Laterality:  N/A;     OB History   No obstetric history on file.     Family History  Problem Relation Age of Onset  . COPD Sister   . Heart disease Sister     Social History   Tobacco Use  . Smoking status: Current Every Day Smoker    Packs/day: 0.50    Years: 5.00    Pack years: 2.50    Types: Cigarettes  . Smokeless tobacco: Never Used  Vaping Use  . Vaping Use: Never used  Substance Use Topics  . Alcohol use: Yes    Alcohol/week: 13.0 standard drinks    Types: 5 Glasses of wine, 4 Cans of beer, 4 Shots of liquor per week  . Drug use: Not on file    Home Medications Prior to Admission medications   Medication Sig Start Date End Date Taking? Authorizing Provider  Multiple Vitamins-Minerals (MULTIVITAMIN WITH MINERALS) tablet Take 1 tablet by mouth daily. 02/23/20 02/22/21 Yes Simmons-Robinson, Makiera, MD  sucralfate (CARAFATE) 1 g tablet Take 1 tablet (1 g total) by mouth 4 (four) times daily as needed. Patient taking differently: Take 1 g by mouth 4 (four) times daily.  03/22/20  Yes [name], MD  acetaminophen (TYLENOL) 160 MG/5ML solution Take 31.3 mLs (1,000 mg total) by mouth every 8 (eight) hours. Patient not taking: Reported on 04/07/2020 02/23/20   Simmons-Robinson, Riki Sheer, MD  furosemide (LASIX) 20 MG tablet Take 1 tablet (20 mg total) by mouth daily as needed for up to 7 doses for fluid or edema. Patient not taking: Reported on 04/07/2020 02/29/20   Zola Button, MD  magnesium gluconate (MAGONATE) 30 MG tablet Take 1 tablet (30 mg total) by mouth 2 (two) times daily. Patient not taking: Reported on 04/07/2020 02/23/20   Simmons-Robinson, Riki Sheer, MD  oxycodone (OXY-IR) 5 MG capsule Take 1 capsule (5 mg total) by mouth every 4 (four) hours as needed. Patient not taking: Reported on 04/07/2020 03/07/20   Benay Pike, MD  pantoprazole (PROTONIX) 40 MG tablet Take 1 tablet (40 mg total) by mouth 2 (two) times daily. Patient not taking: Reported on 04/07/2020 03/03/20   [name], MD  polyethylene glycol powder [facility]) 17 GM/SCOOP powder Take 17 g by mouth 2 (two) times daily as needed. Patient not taking: Reported on 04/07/2020 03/07/20   Benay Pike, MD  potassium chloride 20 MEQ TBCR Take 20 mEq by mouth daily as needed for up to 7 doses. Take this whenever you take Lasix. Patient not taking: Reported on 04/07/2020 02/29/20   Zola Button, MD  potassium phosphate, monobasic, (K-PHOS ORIGINAL) 500 MG tablet Take 2 tablets (1,000 mg total) by mouth 3 (three) times daily with meals. Patient not taking: Reported on 04/07/2020 03/10/20   Benay Pike, MD    Allergies    Nsaids, Lactose intolerance (gi), and Penicillins  Review of Systems   Review of Systems  Constitutional: Positive for fever.  Respiratory: Positive for shortness of breath.   Gastrointestinal: Positive for abdominal pain.  Skin: Positive for wound (JP site drainage, pain).  All other systems reviewed and are negative.   Physical Exam Updated Vital Signs BP 109/68   Pulse (!) 108   Temp 99.9 F (37.7 C) (Oral)   Resp (!) 23   Ht 5\' 10"  (1.778 m)   Wt 129.3 kg   LMP 02/04/2020 (Exact Date)   SpO2 99%   BMI 40.90 kg/m   Physical Exam Vitals and nursing note reviewed.  Constitutional:      Appearance: She is well-developed.     Comments: Non toxic in NAD  HENT:     Head: Normocephalic and atraumatic.     Nose: Nose normal.  Eyes:     Conjunctiva/sclera: Conjunctivae normal.  Cardiovascular:     Rate and Rhythm: Regular rhythm. Tachycardia present.     Comments: Sinus tachycardia on monitor. 1+ radial and DP pulses bilaterally. Diffuse anterior/posterior leg pain "tingling" patient states is chronic. No asymmetric calf edema.  Pulmonary:     Effort: Pulmonary effort is normal.     Breath sounds: Normal breath sounds.  Abdominal:     General: Bowel sounds are normal.     Palpations: Abdomen is soft.     Tenderness: There is abdominal tenderness.     Comments:  Midline ex lap scar without surrounding erythema, warmth, discharge only mildly tender with WTD dressing in place.   LLQ JP drain skin incision exquisitely tender with runny yellow/green drainage expressed.  LUQ/left mid G tube skin incision without tenderness, drainage, warmth, swelling   Musculoskeletal:        General: Normal range of motion.     Cervical back: Normal range of motion.  Skin:    General: Skin is warm and dry.     Capillary Refill: Capillary refill takes less than 2 seconds.  Neurological:     Mental Status: She is alert.  Psychiatric:        Behavior: Behavior normal.           ED Results / Procedures / Treatments   Labs (all labs ordered are listed, but only abnormal results are displayed) Labs Reviewed  LACTIC ACID, PLASMA - Abnormal; Notable for the following components:      Result Value   Lactic Acid, Venous 2.9 (*)    All other components within normal limits  CBC WITH DIFFERENTIAL/PLATELET - Abnormal; Notable for the following components:   WBC 18.2 (*)    Hemoglobin 10.5 (*)    HCT 35.6 (*)    MCH 25.5 (*)    MCHC 29.5 (*)    RDW 19.8 (*)    Platelets 413 (*)    Neutro Abs 15.9 (*)    Abs Immature Granulocytes 0.11 (*)    All other components within normal limits  PROTIME-INR - Abnormal; Notable for the following components:   Prothrombin Time 18.7 (*)    INR 1.6 (*)    All other components within normal limits  COMPREHENSIVE METABOLIC PANEL - Abnormal; Notable for the following components:   CO2 19 (*)    Glucose, Bld 137 (*)    BUN <5 (*)    Total Protein 6.4 (*)    Albumin 1.9 (*)    Alkaline Phosphatase 234 (*)    All other components within normal limits  APTT - Abnormal; Notable for the following components:   aPTT 42 (*)  All other components within normal limits  LIPASE, BLOOD - Abnormal; Notable for the following components:   Lipase 154 (*)    All other components within normal limits  RESPIRATORY PANEL BY RT PCR (FLU  A&B, COVID)  CULTURE, BLOOD (ROUTINE X 2)  CULTURE, BLOOD (ROUTINE X 2)  URINE CULTURE  LACTIC ACID, PLASMA  URINALYSIS, ROUTINE W REFLEX MICROSCOPIC  BASIC METABOLIC PANEL  I-STAT BETA HCG BLOOD, ED ([facility], [facility], [facility] ONLY)    EKG None  Radiology DG Chest 2 View  Result Date: 04/07/2020 CLINICAL DATA:  Possible sepsis. EXAM: CHEST - 2 VIEW COMPARISON:  02/25/2020. FINDINGS: Mediastinum and hilar structures normal. Low lung volumes with bibasilar atelectasis/infiltrates again noted. Similar findings noted on prior exam. No definite pleural effusion noted on today's exam. No pneumothorax. Degenerative change thoracic spine. IMPRESSION: Low lung volumes with bibasilar atelectasis and or infiltrates again noted. Similar findings noted on prior exam. No definite pleural effusion noted on today's exam. Electronically Signed   By: [place]   On: 04/07/2020 14:06   CT Angio Chest PE W/Cm &/Or Wo Cm  Result Date: 04/07/2020 CLINICAL DATA:  Sepsis, recent bariatric surgery EXAM: CT ANGIOGRAPHY CHEST CT ABDOMEN AND PELVIS WITH CONTRAST TECHNIQUE: Multidetector CT imaging of the chest was performed using the standard protocol during bolus administration of intravenous contrast. Multiplanar CT image reconstructions and MIPs were obtained to evaluate the vascular anatomy. Multidetector CT imaging of the abdomen and pelvis was performed using the standard protocol during bolus administration of intravenous contrast. CONTRAST:  181mL OMNIPAQUE IOHEXOL 350 MG/ML SOLN COMPARISON:  03/24/2020 FINDINGS: CTA CHEST FINDINGS Cardiovascular: This is a technically adequate evaluation of the pulmonary vasculature. No filling defects or pulmonary emboli. The heart is unremarkable without pericardial effusion. Normal caliber of the thoracic aorta. Mediastinum/Nodes: No enlarged mediastinal, hilar, or axillary lymph nodes. Thyroid gland, trachea, and esophagus demonstrate no significant findings. Lungs/Pleura: Scattered  areas of atelectasis are seen within the right middle and bilateral lower lobes. No airspace disease, effusion, or pneumothorax. Central airways are patent. Musculoskeletal: No acute or destructive bony lesions. Reconstructed images demonstrate no additional findings. Review of the MIP images confirms the above findings. CT ABDOMEN and PELVIS FINDINGS Hepatobiliary: Gallbladder is surgically absent. The liver enhances normally. No biliary dilation. Pancreas: Pancreas enhances normally. Peripancreatic edema is felt to be related to recent abdominal surgery, though pancreatitis cannot be completely excluded. Please correlate with laboratory analysis. Spleen: Borderline splenomegaly is unchanged. Adrenals/Urinary Tract: Adrenal glands are unremarkable. Kidneys are normal, without renal calculi, focal lesion, or hydronephrosis. Bladder is unremarkable. Stomach/Bowel: Evaluation of the bowel and postsurgical abdomen is limited without oral contrast. Postsurgical changes are seen from bariatric surgery with staple line at the gastric fundus. Wall thickening is seen within the excluded portion of the gastric fundus. There is edema at the gastrojejunostomy, and evaluation is limited without oral contrast. The percutaneous gastrostomy tube seen previously has been removed in the interim. Continued inflammatory changes within the left upper quadrant mesentery, at the site of previous gas/fluid collection. Small residual area of fluid in the left upper quadrant image 36 measures 0.9 cm in diameter. Mild dilation of the small bowel at the jejunojejunostomy site left lower quadrant. Again, evaluation of the anastomosis is limited without oral contrast. There is no evidence of bowel obstruction. Normal appendix right lower quadrant. Vascular/Lymphatic: Multiple subcentimeter lymph nodes are seen within the central upper abdominal mesentery, reactive. No pathologically enlarged lymph nodes. No significant vascular findings.  Reproductive: Exophytic uterine  fibroid unchanged. No adnexal masses. Other: Trace free fluid in the bilateral paracolic gutters. No free intraperitoneal gas. Postsurgical changes are seen from prior midline laparotomy, with small fat containing peri-incisional hernia unchanged. No abdominal wall fluid collection. No bowel herniation. Musculoskeletal: No acute or destructive bony lesions. Reconstructed images demonstrate no additional findings. Review of the MIP images confirms the above findings. IMPRESSION: 1. No evidence of pulmonary embolus. 2. Postsurgical changes from bariatric surgery, with gastric wall edema and edema at the gastrojejunostomy. Evaluation of the anastomosis is limited without oral contrast. 3. Persistent inflammatory changes within the left upper quadrant, with subcentimeter residual fluid collection as above. 4. Peripancreatic edema is felt to be related to recent abdominal surgery, though pancreatitis cannot be completely excluded. Please correlate with laboratory analysis. Electronically Signed   By: Randa Ngo M.D.   On: 04/07/2020 19:00   CT ABDOMEN PELVIS W CONTRAST  Addendum Date: 04/07/2020   ADDENDUM REPORT: 04/07/2020 20:52 ADDENDUM: Agree with Dr. Saul Fordyce assessment. Case was further discussed with PA Hammon as follows: Interval increase in marked left upper quadrant inflammatory changes with worsened inflammation of the fundus/body of the bypassed protion of the stomach as well as the distal bypassed dudoenum/jejunum just proximal to the jejunojejunostomy. Findings suggestive of a marginal ulcer formation. Cannot exclude gastric/small bowel leak. Associated reactive changes of the splenic flexure/proximal transverse colon. Enteric-enteric fistula in the left upper abdomen between the biliopancreatic limb and proximal transverse colon not excluded. Interval increase in distal pancreatic body contour haziness and peripancreatic fluid. Findings likely representative of  reactive inflammation; however, acute pancreatitis cannot be excluded. Correlate with lipase levels. No drainable organized fluid collection. Retained LUQ prior surgical drain tract. If subsequent CT abdomen pelvis obtained, consider water-soluble PO contrast for further evaluation. Tiny fat containing incisional hernia with an abdominal defect of 0.6 cm. Healing abdominal incision. No subcutaneous soft tissue organized fluid collection or definite phlegmon. No pneumoperitoneum. No bowel obstruction. These results were called by telephone at the time of interpretation on 04/07/2020 at 7:49 pm to provider Tamala Julian PA , who verbally acknowledged these results. Electronically Signed   By: Iven Finn M.D.   On: 04/07/2020 20:52   Result Date: 04/07/2020 CLINICAL DATA:  Sepsis, recent bariatric surgery EXAM: CT ABDOMEN AND PELVIS WITH CONTRAST TECHNIQUE: Multidetector CT imaging of the abdomen and pelvis was performed using the standard protocol following bolus administration of intravenous contrast. CONTRAST:  122mL OMNIPAQUE IOHEXOL 350 MG/ML SOLN COMPARISON:  03/24/2020, 02/18/2020 FINDINGS: CTA CHEST FINDINGS Cardiovascular: This is a technically adequate evaluation of the pulmonary vasculature. No filling defects or pulmonary emboli. The heart is unremarkable without pericardial effusion. Normal caliber of the thoracic aorta. Mediastinum/Nodes: No enlarged mediastinal, hilar, or axillary lymph nodes. Thyroid gland, trachea, and esophagus demonstrate no significant findings. Lungs/Pleura: Scattered areas of atelectasis are seen within the right middle and bilateral lower lobes. No airspace disease, effusion, or pneumothorax. Central airways are patent. Musculoskeletal: No acute or destructive bony lesions. Reconstructed images demonstrate no additional findings. Review of the MIP images confirms the above findings. CT ABDOMEN and PELVIS FINDINGS Hepatobiliary: Gallbladder is surgically absent. The liver  enhances normally. No biliary dilation. Pancreas: Pancreas enhances normally. Peripancreatic edema is felt to be related to recent abdominal surgery, though pancreatitis cannot be completely excluded. Please correlate with laboratory analysis. Spleen: Borderline splenomegaly is unchanged. Adrenals/Urinary Tract: Adrenal glands are unremarkable. Kidneys are normal, without renal calculi, focal lesion, or hydronephrosis. Bladder is unremarkable. Stomach/Bowel: Evaluation of the bowel and postsurgical  abdomen is limited without oral contrast. Postsurgical changes are seen from bariatric surgery with staple line at the gastric fundus. Wall thickening is seen within the excluded portion of the gastric fundus. There is edema at the gastrojejunostomy, and evaluation is limited without oral contrast. The percutaneous gastrostomy tube seen previously has been removed in the interim. Continued inflammatory changes within the left upper quadrant mesentery, at the site of previous gas/fluid collection. Small residual area of fluid in the left upper quadrant image 36 measures 0.9 cm in diameter. Mild dilation of the small bowel at the jejunojejunostomy site left lower quadrant. Again, evaluation of the anastomosis is limited without oral contrast. There is no evidence of bowel obstruction. Normal appendix right lower quadrant. Vascular/Lymphatic: Multiple subcentimeter lymph nodes are seen within the central upper abdominal mesentery, reactive. No pathologically enlarged lymph nodes. No significant vascular findings. Reproductive: Exophytic uterine fibroid unchanged. No adnexal masses. Other: Trace free fluid in the bilateral paracolic gutters. No free intraperitoneal gas. Postsurgical changes are seen from prior midline laparotomy, with small fat containing peri-incisional hernia unchanged. No abdominal wall fluid collection. No bowel herniation. Musculoskeletal: No acute or destructive bony lesions. Reconstructed images  demonstrate no additional findings. Review of the MIP images confirms the above findings. IMPRESSION: 1. No evidence of pulmonary embolus. 2. Postsurgical changes from bariatric surgery, with gastric wall edema and edema at the gastrojejunostomy. Evaluation of the anastomosis is limited without oral contrast. 3. Persistent inflammatory changes within the left upper quadrant, with subcentimeter residual fluid collection as above. 4. Peripancreatic edema is felt to be related to recent abdominal surgery, though pancreatitis cannot be completely excluded. Please correlate with laboratory analysis. Electronically Signed: By: Randa Ngo M.D. On: 04/07/2020 20:15    Procedures .Critical Care Performed by: Kinnie Feil, PA-C Authorized by: Kinnie Feil, PA-C   Critical care provider statement:    Critical care time (minutes):  45   Critical care was necessary to treat or prevent imminent or life-threatening deterioration of the following conditions:  Sepsis   Critical care was time spent personally by me on the following activities:  Discussions with consultants, evaluation of patient's response to treatment, examination of patient, ordering and performing treatments and interventions, ordering and review of laboratory studies, ordering and review of radiographic studies, pulse oximetry, re-evaluation of patient's condition, obtaining history from patient or surrogate, review of old charts and development of treatment plan with patient or surrogate   I assumed direction of critical care for this patient from another provider in my specialty: no     (including critical care time)  Medications Ordered in ED Medications  lactated ringers infusion ( Intravenous New Bag/Given 04/07/20 1731)  vancomycin (VANCOREADY) IVPB 1500 mg/300 mL (has no administration in time range)  ceFEPIme (MAXIPIME) 2 g in sodium chloride 0.9 % 100 mL IVPB (has no administration in time range)  pantoprazole  (PROTONIX) injection 40 mg (has no administration in time range)  lactated ringers bolus 1,000 mL (0 mLs Intravenous Stopped 04/07/20 1605)    And  lactated ringers bolus 1,000 mL (0 mLs Intravenous Stopped 04/07/20 1635)    And  lactated ringers bolus 200 mL (0 mLs Intravenous Stopped 04/07/20 1737)  ceFEPIme (MAXIPIME) 2 g in sodium chloride 0.9 % 100 mL IVPB (0 g Intravenous Stopped 04/07/20 1605)  metroNIDAZOLE (FLAGYL) IVPB 500 mg (0 mg Intravenous Stopped 04/07/20 1605)  acetaminophen (TYLENOL) tablet 1,000 mg (1,000 mg Oral Given 04/07/20 1440)  morphine 4 MG/ML injection 4 mg (4  mg Intravenous Given 04/07/20 1440)  vancomycin (VANCOCIN) 2,500 mg in sodium chloride 0.9 % 500 mL IVPB (0 mg Intravenous Stopped 04/07/20 1719)  iohexol (OMNIPAQUE) 350 MG/ML injection 100 mL (100 mLs Intravenous Contrast Given 04/07/20 1811)  morphine 4 MG/ML injection 4 mg (4 mg Intravenous Given 04/07/20 2125)    ED Course  I have reviewed the triage vital signs and the nursing notes.  Pertinent labs & imaging results that were available during my care of the patient were reviewed by me and considered in my medical decision making (see chart for details).  Clinical Course as of Apr 07 2126  Fri Apr 07, 2020  1346 Temp(!): 100.8 F (38.2 C) [CG]  1346 Pulse Rate(!): 133 [CG]  1421 IMPRESSION: Low lung volumes with bibasilar atelectasis and or infiltrates again noted. Similar findings noted on prior exam. No definite pleural effusion noted on today's exam.  DG Chest 2 View [CG]  1421 WBC(!): 18.2 [CG]  1421 Temp(!): 100.8 F (38.2 C) [CG]  1421 Pulse Rate(!): 133 [CG]  1421 Lactic Acid, Venous(!!): 2.9 [CG]    Clinical Course User Index [CG] Arlean Hopping   MDM Rules/Calculators/A&P                          Patient's EMR triage and nursing notes reviewed to assist with obtaining more history and MDM.   Patient arrives febrile, tachycardic, tachypnic.  Meets SIRS criteria.  Given recent  intraabdominal abscess, surgeries high suspicion for intraabdominal source.  She reports feeling winded but no cough. Unvaccinated for COVID, this is also considered. No UTI symptoms. ?PE.  ER work up initiated by [facility] administrator. I have added blood cultures, lactic acids, lipase.   ER work up thus far personally reviewed and interpreted.   Leukocytosis 18.2, lactic acid 2.9.  Patient meets severe sepsis criteria.    I have ordered 30 cc/kg IVF, intraabdominal infectious antibiotics, tylenol, pain medicines.    CXR not significantly changed from previous.   Patient re-evaluated prior to shift change, continues to reports abdominal pain and SOB. Tachycardia improving slightly. No hyopxia. No worsening clinical status.   Patient will be handed off to oncoming EDPA who will follow up on pending CTAP, UA, COVID test, labs.  Anticiate admission.   Final Clinical Impression(s) / ED Diagnoses Final diagnoses:  Sepsis without acute organ dysfunction, due to unspecified organism [facility])    Rx / [place] Orders ED Discharge Orders    None          Kinnie Feil, PA-C 04/07/20 2129    Kinnie Feil, PA-C 04/07/20 2130    Davonna Belling, MD 04/08/20 [phone]

## 2020-04-07 NOTE — ED Triage Notes (Signed)
Pt brought to ED by [facility] from home for a code sepsis activation, sent to triage by CN on arrival. Pt had abd sx, some yellowish secretion noticed on the incision noticed. Initial HR 140, BP 110/70 temp 101.7 r-40 SPO2 96% RA. cbg 158. 1 L NS given by EMS prior to arrival, HR down to 120 BP 140/80.

## 2020-04-07 NOTE — ED Provider Notes (Signed)
I assumed care of patient at shift change, please see note by previous team for full H&P.  Briefly patient has a complicated recent abdominal surgical history with a GJ tube and JP drains that have both been removed.  She has had increasing pain and was instructed reportedly by Dr. [name] to come to the ER for a CT scan.  On arrival patient was found to be septic with a white count of 18, febrile at 100.8, tachycardic at 133 and has been tachypneic while in the ER.  Her lactic acid is elevated at 2.9.   Physical Exam  BP (!) 142/74 (BP Location: Right Arm)   Pulse (!) 133   Temp (!) 100.8 F (38.2 C) (Oral)   Resp 18   Ht 5\' 10"  (1.778 m)   Wt 129.3 kg   LMP 02/04/2020 (Exact Date)   SpO2 100%   BMI 40.90 kg/m   Physical Exam  Patient is resting in bed.  Her abdomen is generally tender.  She is tachycardic however her respirations are not significantly labored.  She is awake and alert, answers all questions appropriately.     ED Course/Procedures   Clinical Course as of Apr 08 1507  Fri Apr 07, 2020  1346 Temp(!): 100.8 F (38.2 C) [CG]  1346 Pulse Rate(!): 133 [CG]  1421 IMPRESSION: Low lung volumes with bibasilar atelectasis and or infiltrates again noted. Similar findings noted on prior exam. No definite pleural effusion noted on today's exam.  DG Chest 2 View [CG]  1421 WBC(!): 18.2 [CG]  1421 Temp(!): 100.8 F (38.2 C) [CG]  1421 Pulse Rate(!): 133 [CG]  1421 Lactic Acid, Venous(!!): 2.9 [CG]    Clinical Course User Index [CG] Kinnie Feil, PA-C    .Critical Care Performed by: Lorin Glass, PA-C Authorized by: Lorin Glass, PA-C   Critical care provider statement:    Critical care time (minutes):  45   Critical care time was exclusive of:  Separately billable procedures and treating other patients   Critical care was necessary to treat or prevent imminent or life-threatening deterioration of the following conditions:  Sepsis   Critical care  was time spent personally by me on the following activities:  Discussions with consultants, evaluation of patient's response to treatment, examination of patient, ordering and performing treatments and interventions, ordering and review of laboratory studies, ordering and review of radiographic studies, pulse oximetry, re-evaluation of patient's condition, obtaining history from patient or surrogate and review of old charts    DG Chest 2 View  Result Date: 04/07/2020 CLINICAL DATA:  Possible sepsis. EXAM: CHEST - 2 VIEW COMPARISON:  02/25/2020. FINDINGS: Mediastinum and hilar structures normal. Low lung volumes with bibasilar atelectasis/infiltrates again noted. Similar findings noted on prior exam. No definite pleural effusion noted on today's exam. No pneumothorax. Degenerative change thoracic spine. IMPRESSION: Low lung volumes with bibasilar atelectasis and or infiltrates again noted. Similar findings noted on prior exam. No definite pleural effusion noted on today's exam. Electronically Signed   By: Kelly   On: 04/07/2020 14:06   CT Angio Chest PE W/Cm &/Or Wo Cm  Result Date: 04/07/2020 CLINICAL DATA:  Sepsis, recent bariatric surgery EXAM: CT ANGIOGRAPHY CHEST CT ABDOMEN AND PELVIS WITH CONTRAST TECHNIQUE: Multidetector CT imaging of the chest was performed using the standard protocol during bolus administration of intravenous contrast. Multiplanar CT image reconstructions and MIPs were obtained to evaluate the vascular anatomy. Multidetector CT imaging of the abdomen and pelvis was performed using  the standard protocol during bolus administration of intravenous contrast. CONTRAST:  187mL OMNIPAQUE IOHEXOL 350 MG/ML SOLN COMPARISON:  03/24/2020 FINDINGS: CTA CHEST FINDINGS Cardiovascular: This is a technically adequate evaluation of the pulmonary vasculature. No filling defects or pulmonary emboli. The heart is unremarkable without pericardial effusion. Normal caliber of the thoracic  aorta. Mediastinum/Nodes: No enlarged mediastinal, hilar, or axillary lymph nodes. Thyroid gland, trachea, and esophagus demonstrate no significant findings. Lungs/Pleura: Scattered areas of atelectasis are seen within the right middle and bilateral lower lobes. No airspace disease, effusion, or pneumothorax. Central airways are patent. Musculoskeletal: No acute or destructive bony lesions. Reconstructed images demonstrate no additional findings. Review of the MIP images confirms the above findings. CT ABDOMEN and PELVIS FINDINGS Hepatobiliary: Gallbladder is surgically absent. The liver enhances normally. No biliary dilation. Pancreas: Pancreas enhances normally. Peripancreatic edema is felt to be related to recent abdominal surgery, though pancreatitis cannot be completely excluded. Please correlate with laboratory analysis. Spleen: Borderline splenomegaly is unchanged. Adrenals/Urinary Tract: Adrenal glands are unremarkable. Kidneys are normal, without renal calculi, focal lesion, or hydronephrosis. Bladder is unremarkable. Stomach/Bowel: Evaluation of the bowel and postsurgical abdomen is limited without oral contrast. Postsurgical changes are seen from bariatric surgery with staple line at the gastric fundus. Wall thickening is seen within the excluded portion of the gastric fundus. There is edema at the gastrojejunostomy, and evaluation is limited without oral contrast. The percutaneous gastrostomy tube seen previously has been removed in the interim. Continued inflammatory changes within the left upper quadrant mesentery, at the site of previous gas/fluid collection. Small residual area of fluid in the left upper quadrant image 36 measures 0.9 cm in diameter. Mild dilation of the small bowel at the jejunojejunostomy site left lower quadrant. Again, evaluation of the anastomosis is limited without oral contrast. There is no evidence of bowel obstruction. Normal appendix right lower quadrant.  Vascular/Lymphatic: Multiple subcentimeter lymph nodes are seen within the central upper abdominal mesentery, reactive. No pathologically enlarged lymph nodes. No significant vascular findings. Reproductive: Exophytic uterine fibroid unchanged. No adnexal masses. Other: Trace free fluid in the bilateral paracolic gutters. No free intraperitoneal gas. Postsurgical changes are seen from prior midline laparotomy, with small fat containing peri-incisional hernia unchanged. No abdominal wall fluid collection. No bowel herniation. Musculoskeletal: No acute or destructive bony lesions. Reconstructed images demonstrate no additional findings. Review of the MIP images confirms the above findings. IMPRESSION: 1. No evidence of pulmonary embolus. 2. Postsurgical changes from bariatric surgery, with gastric wall edema and edema at the gastrojejunostomy. Evaluation of the anastomosis is limited without oral contrast. 3. Persistent inflammatory changes within the left upper quadrant, with subcentimeter residual fluid collection as above. 4. Peripancreatic edema is felt to be related to recent abdominal surgery, though pancreatitis cannot be completely excluded. Please correlate with laboratory analysis. Electronically Signed   By: Randa Ngo M.D.   On: 04/07/2020 19:00    04/07/2020   Labs Reviewed  LACTIC ACID, PLASMA - Abnormal; Notable for the following components:      Result Value   Lactic Acid, Venous 2.9 (*)    All other components within normal limits  CBC WITH DIFFERENTIAL/PLATELET - Abnormal; Notable for the following components:   WBC 18.2 (*)    Hemoglobin 10.5 (*)    HCT 35.6 (*)    MCH 25.5 (*)    MCHC 29.5 (*)    RDW 19.8 (*)    Platelets 413 (*)    Neutro Abs 15.9 (*)    Abs Immature Granulocytes  0.11 (*)    All other components within normal limits  PROTIME-INR - Abnormal; Notable for the following components:   Prothrombin Time 18.7 (*)    INR 1.6 (*)    All other components within  normal limits  COMPREHENSIVE METABOLIC PANEL - Abnormal; Notable for the following components:   CO2 19 (*)    Glucose, Bld 137 (*)    BUN <5 (*)    Total Protein 6.4 (*)    Albumin 1.9 (*)    Alkaline Phosphatase 234 (*)    All other components within normal limits  APTT - Abnormal; Notable for the following components:   aPTT 42 (*)    All other components within normal limits  LIPASE, BLOOD - Abnormal; Notable for the following components:   Lipase 154 (*)    All other components within normal limits  RESPIRATORY PANEL BY RT PCR (FLU A&B, COVID)  CULTURE, BLOOD (ROUTINE X 2)  CULTURE, BLOOD (ROUTINE X 2)  URINE CULTURE  LACTIC ACID, PLASMA  URINALYSIS, ROUTINE W REFLEX MICROSCOPIC  BASIC METABOLIC PANEL  I-STAT BETA HCG BLOOD, ED ([facility], [facility], [facility] ONLY)     MDM   Sepsis, getting 6ml/kg, scan,  When press where JP tube was get some drainage.  Short of breath but no cough CXR is ok, not vaccinated [name] spoke with patient today  Plan to reconsult once results are back.   1654: Patient is reevaluated.  She is aware of plan to obtain CT scans.  No current needs.  CT scans have resulted.  I spoke with Dr. Windle Guard on-call for general surgery.  She states that there does not appear to be anything acutely surgical to do, request medicine admit and general surgery will follow.  She will come see the patient in consult.  Patient is assigned to family medicine.  I spoke with family medicine who will see patient for admission.  Additional morphine was ordered for pain while patient was in the emergency room.  Rebekah Zackery was evaluated in Emergency Department on 04/07/2020 for the symptoms described in the history of present illness. She was evaluated in the context of the global COVID-19 pandemic, which necessitated consideration that the patient might be at risk for infection with the SARS-CoV-2 virus that causes COVID-19. Institutional protocols and algorithms that pertain to the  evaluation of patients at risk for COVID-19 are in a state of rapid change based on information released by regulatory bodies including the CDC and federal and state organizations. These policies and algorithms were followed during the patient's care in the ED.  Note: Portions of this report may have been transcribed using voice recognition software. Every effort was made to ensure accuracy; however, inadvertent computerized transcription errors may be present        Ollen Gross 04/07/20 1959    Gareth Morgan, MD 04/10/20 0030

## 2020-04-07 NOTE — ED Notes (Signed)
Patient transported to CT 

## 2020-04-07 NOTE — Progress Notes (Signed)
Went to evaluate patient after she called out with nausea, abdominal pain, leg tingling.  She reports the nausea started after she was given a dose of morphine in the emergency department.  She also began dry heaving and every time she dry heaves she had abdominal pain.  The tingling in her lower extremities is still present and what she was having issues with since being hospitalized previously.  She also had questions regarding how long she would be in the hospital.  We discussed the plans for her management.  For her nausea we ordered Zofran 4 mg IV once.  For the tingling in her lower extremities we ordered 100 mg Neurontin one-time dose tonight to see if she has benefit from this.  Patient is understanding.

## 2020-04-08 ENCOUNTER — Inpatient Hospital Stay (HOSPITAL_COMMUNITY): Payer: BC Managed Care – PPO

## 2020-04-08 DIAGNOSIS — Z9884 Bariatric surgery status: Secondary | ICD-10-CM

## 2020-04-08 DIAGNOSIS — A419 Sepsis, unspecified organism: Principal | ICD-10-CM

## 2020-04-08 LAB — CBC
HCT: 27.8 % — ABNORMAL LOW (ref 36.0–46.0)
Hemoglobin: 8.1 g/dL — ABNORMAL LOW (ref 12.0–15.0)
MCH: 25 pg — ABNORMAL LOW (ref 26.0–34.0)
MCHC: 29.1 g/dL — ABNORMAL LOW (ref 30.0–36.0)
MCV: 85.8 fL (ref 80.0–100.0)
Platelets: 336 K/uL (ref 150–400)
RBC: 3.24 MIL/uL — ABNORMAL LOW (ref 3.87–5.11)
RDW: 19.8 % — ABNORMAL HIGH (ref 11.5–15.5)
WBC: 12.8 K/uL — ABNORMAL HIGH (ref 4.0–10.5)
nRBC: 0 % (ref 0.0–0.2)

## 2020-04-08 LAB — COMPREHENSIVE METABOLIC PANEL
ALT: 8 U/L (ref 0–44)
AST: 14 U/L — ABNORMAL LOW (ref 15–41)
Albumin: 1.7 g/dL — ABNORMAL LOW (ref 3.5–5.0)
Alkaline Phosphatase: 225 U/L — ABNORMAL HIGH (ref 38–126)
Anion gap: 5 (ref 5–15)
BUN: <5 mg/dL — ABNORMAL LOW (ref 6–20)
CO2: 21 mmol/L — ABNORMAL LOW (ref 22–32)
Calcium: 9.8 mg/dL (ref 8.9–10.3)
Chloride: 108 mmol/L (ref 98–111)
Creatinine, Ser: 0.48 mg/dL (ref 0.44–1.00)
GFR calc Af Amer: >60 mL/min (ref >60)
GFR calc non Af Amer: >60 mL/min (ref >60)
Glucose, Bld: 110 mg/dL — ABNORMAL HIGH (ref 70–99)
Potassium: 3.4 mmol/L — ABNORMAL LOW (ref 3.5–5.1)
Sodium: 134 mmol/L — ABNORMAL LOW (ref 135–145)
Total Bilirubin: 0.7 mg/dL (ref 0.3–1.2)
Total Protein: 6 g/dL — ABNORMAL LOW (ref 6.5–8.1)

## 2020-04-08 LAB — MRSA PCR SCREENING: MRSA by PCR: NEGATIVE

## 2020-04-08 MED ORDER — GABAPENTIN 100 MG PO CAPS
100.0000 mg | ORAL_CAPSULE | Freq: Three times a day (TID) | ORAL | Status: DC | PRN
  Administered 2020-04-08 – 2020-04-09 (×3): 100 mg via ORAL
  Filled 2020-04-08 (×4): qty 1

## 2020-04-08 MED ORDER — CHLORHEXIDINE GLUCONATE CLOTH 2 % EX PADS
6.0000 | MEDICATED_PAD | Freq: Every day | CUTANEOUS | Status: DC
  Administered 2020-04-08 – 2020-04-10 (×3): 6 via TOPICAL

## 2020-04-08 MED ORDER — MORPHINE SULFATE (PF) 2 MG/ML IV SOLN
1.0000 mg | Freq: Once | INTRAVENOUS | Status: AC
  Administered 2020-04-08: 1 mg via INTRAVENOUS
  Filled 2020-04-08: qty 1

## 2020-04-08 MED ORDER — SODIUM CHLORIDE 0.9% FLUSH: 10.0000 mL | INTRAVENOUS | Status: DC | PRN

## 2020-04-08 MED ORDER — ONDANSETRON HCL 4 MG/2ML IJ SOLN
4.0000 mg | Freq: Three times a day (TID) | INTRAMUSCULAR | Status: DC | PRN
  Administered 2020-04-08: 4 mg via INTRAVENOUS
  Filled 2020-04-08: qty 2

## 2020-04-08 MED ORDER — BOOST / RESOURCE BREEZE PO LIQD CUSTOM
1.0000 | Freq: Three times a day (TID) | ORAL | Status: DC
  Administered 2020-04-08 – 2020-04-09 (×2): 1 via ORAL

## 2020-04-08 NOTE — Progress Notes (Addendum)
Family Medicine Teaching Service Daily Progress Note Intern Pager: [phone]  Patient name: Alexis Avila Medical record number: [id number] Date of birth: Aug 17, 1980 Age: 39 y.o. Gender: female  Primary Care Provider: [name], MD Consultants: Surgery Code Status: FULL  Pt Overview and Major Events to Date:  9/24: admitted, started on IV vanc, cefepime and metronidazole, surgery consulted  Assessment and Plan:  Alexis Avila is a 39 y.o. female presenting with fever. PMH is significant for recent ex-lap on 8/2 for possible bowel perforation, s/p G tube and JP drain, pancreatic pseudocyst, hx of roux-en-y bypass in 2002, gastric ulcers, and b12 deficiency.   Sepsis, likely intraabdominal source  Abdominal pain Continues to meet sepsis criteria. Patient has remained afebrile since admission, most recent temp 99.26F, but has been persistently tachycardic and tachypneic with HR in the 110s and RR high 20s to low 30s. WBC 12.8 this morning (from 18.2 on admission). Suspect intra-abdominal source as CXR and UA were unremarkable and CT abdomen pelvis demonstrated the following: increase in marked left upper quadrant inflammatory changes with worsened inflammation of the fundus/body of the bypassed portion of the stomach as well as the distal bypassed dudoenum/jejunum just proximal to the jejunojejunostomy. Findings suggestive of a marginal ulcer formation. Cannot exclude gastric/small bowel leak. Also could not exclude enteric-enteric fistula or acute pancreatitis. -Surgery following, appreciate recommendations -Plan for upper GI series this am -Continue vancomycin, cefepime, and flagyl -Continue LR @ 150 cc/hr -Monitor fever curve, WBC -Continue Protonix drip per surgery -IV Morphine 1mg  q4h for pain, consider transition to Tramadol when she is no longer NPO as patient states this has worked best for her recently -IV Zofran 4mg  q8h prn for nausea  Tachypnea Patient persistently  tachypneic with RR in the high 20s to 30s. Denies SOB or respiratory complaints this morning. Suspect her tachypnea is related to sepsis and volume depletion and expect it to resolve with IV fluids and antibiotics. May also be component of pain contributing to her increased resp rate. Upon chart review, patient was intermittently tachypneic throughout her entire last admission as well. -Continue to monitor -Continuous pulse ox and cardiac monitoring -DVT/PE ppx with Lovenox  Bilateral Leg Tingling Patient states her leg tingling improved significantly after receiving Gabapentin last night. Suspect the etiology of her leg symptoms is a neuropathy related to significant edema on prior admission. -Continue Gabapentin 100mg  TID prn   Marginal Ulcer Hx of marginal ulcer in the past. CT abd/pelvis obtained on admission concerning for marginal ulcer formation, unclear whether this is new. Home meds include Carafate and Protonix -IV Protonix per surgery -Upper GI series this am -Holding carafate for now pending upper GI series  Normocytic Anemia, Hx B12 deficiency, s/p gastric bypass Hemoglobin 8.1 this morning, from 10.5 on admission. Suspect there is a component of dilutional anemia as patient has received significant fluid resuscitation (s/p 2L + mIVF at 150cc/hr) and her WBC, Hgb, Hct, and Plt are all decreased from admission. Patient's most recent baseline hgb ~9 one month ago upon discharge from the hospital. -Daily CBC -Transfusion threshold <7   FEN/GI: NPO for upper GI series PPx: Lovenox   Status is: Inpatient Remains inpatient appropriate because:Ongoing diagnostic testing needed not appropriate for outpatient work up and IV treatments appropriate due to intensity of illness or inability to take PO  Dispo: The patient is from: Home              Anticipated d/c is to: Home  Anticipated d/c date is: > 3 days              Patient currently is not medically stable to  d/c.   Subjective:  Patient complains of ongoing intermittent left sided abdominal pain. States the pain comes and goes, is sharp in nature and will typically last a few seconds to minutes before subsiding. Will sometimes radiate into the right side of abdomen.  Denies nausea currently. No vomiting, chest pain, SOB or other complaints. Patient's leg pain resolved after receiving Gabapentin yesterday evening.  Objective: Temp:  [99 F (37.2 C)-100.8 F (38.2 C)] 99.3 F (37.4 C) (09/25 0256) Pulse Rate:  [108-133] 114 (09/25 0400) Resp:  [18-41] 31 (09/25 0400) BP: (106-151)/(58-81) 113/63 (09/25 0256) SpO2:  [91 %-100 %] 92 % (09/25 0400) Weight:  [129.3 kg] 129.3 kg (09/24 1316) Physical Exam: General: alert, non-toxic appearing, NAD Cardiovascular: tachycardic, normal S1/S2 without m/r/g Respiratory: normal WOB on room air, diminished BS at bilateral bases, lungs otherwise CTAB Abdomen: +BS, mild diffuse tenderness greatest in LLQ. Wound dressings in place, clean dry and intact.  Extremities: 1+ nonpitting edema of b/l lower extremities, 2+ distal pulses, 5/5 strength in all four extremities  Laboratory: Recent Labs  Lab 04/07/20 1328 04/08/20 0448  WBC 18.2* 12.8*  HGB 10.5* 8.1*  HCT 35.6* 27.8*  PLT 413* 336   Recent Labs  Lab 04/07/20 1600  NA 136  K 3.5  CL 108  CO2 19*  BUN <5*  CREATININE 0.54  CALCIUM 9.6  PROT 6.4*  BILITOT 1.0  ALKPHOS 234*  ALT 9  AST 16  GLUCOSE 137*    Imaging/Diagnostic Tests: DG Chest 2 View Result Date: 04/07/2020 IMPRESSION: Low lung volumes with bibasilar atelectasis and or infiltrates again noted. Similar findings noted on prior exam. No definite pleural effusion noted on today's exam.  CT Angio Chest PE W/Cm &/Or Wo Cm Result Date: 04/07/2020 IMPRESSION: 1. No evidence of pulmonary embolus. 2. Postsurgical changes from bariatric surgery, with gastric wall edema and edema at the gastrojejunostomy. Evaluation of the  anastomosis is limited without oral contrast. 3. Persistent inflammatory changes within the left upper quadrant, with subcentimeter residual fluid collection as above. 4. Peripancreatic edema is felt to be related to recent abdominal surgery, though pancreatitis cannot be completely excluded. Please correlate with laboratory analysis.   CT ABDOMEN PELVIS W CONTRAST Addendum Date: 04/07/2020   ADDENDUM: Agree with Dr. Saul Fordyce assessment. Case was further discussed with PA Hammon as follows: Interval increase in marked left upper quadrant inflammatory changes with worsened inflammation of the fundus/body of the bypassed protion of the stomach as well as the distal bypassed dudoenum/jejunum just proximal to the jejunojejunostomy. Findings suggestive of a marginal ulcer formation. Cannot exclude gastric/small bowel leak. Associated reactive changes of the splenic flexure/proximal transverse colon. Enteric-enteric fistula in the left upper abdomen between the biliopancreatic limb and proximal transverse colon not excluded. Interval increase in distal pancreatic body contour haziness and peripancreatic fluid. Findings likely representative of reactive inflammation; however, acute pancreatitis cannot be excluded. Correlate with lipase levels. No drainable organized fluid collection. Retained LUQ prior surgical drain tract. If subsequent CT abdomen pelvis obtained, consider water-soluble PO contrast for further evaluation. Tiny fat containing incisional hernia with an abdominal defect of 0.6 cm. Healing abdominal incision. No subcutaneous soft tissue organized fluid collection or definite phlegmon. No pneumoperitoneum. No bowel obstruction.     Alcus Dad, MD 04/08/2020, 5:56 AM PGY-1, [place] Intern pager: [phone], text  pages welcome

## 2020-04-08 NOTE — Plan of Care (Signed)

## 2020-04-08 NOTE — Progress Notes (Signed)
Notified Family Medicine on call for complaint of pain.

## 2020-04-08 NOTE — Progress Notes (Signed)
RN spoke with MD regarding patient request for regular diet and asking for Gabapentin to help with leg neuropathic type pain. MD in to see patient and spoke with patient regarding plan of care.

## 2020-04-08 NOTE — Progress Notes (Signed)
Subjective/Chief Complaint: Still with intermittent pain Afebrile   Objective: Vital signs in last 24 hours: Temp:  [98.6 F (37 C)-100.8 F (38.2 C)] 98.6 F (37 C) (09/25 0717) Pulse Rate:  [106-133] 106 (09/25 0717) Resp:  [18-41] 23 (09/25 0717) BP: (106-151)/(58-81) 113/66 (09/25 0717) SpO2:  [91 %-100 %] 95 % (09/25 0717) Weight:  [129.3 kg] 129.3 kg (09/24 1316) Last BM Date: (P) 04/07/20  Intake/Output from previous day: 09/24 0701 - 09/25 0700 In: 487 [P.O.:140; I.V.:24.6; IV Piggyback:322.4] Out: -  Intake/Output this shift: No intake/output data recorded.  WDWn in NAD Abd - soft, mildly diffusely tender; granulated wound; tender around drainage site  Lab Results:  Recent Labs    04/07/20 1328 04/08/20 0448  WBC 18.2* 12.8*  HGB 10.5* 8.1*  HCT 35.6* 27.8*  PLT 413* 336   BMET Recent Labs    04/07/20 1600 04/08/20 0448  NA 136 134*  K 3.5 3.4*  CL 108 108  CO2 19* 21*  GLUCOSE 137* 110*  BUN <5* <5*  CREATININE 0.54 0.48  CALCIUM 9.6 9.8   PT/INR Recent Labs    04/07/20 1600  LABPROT 18.7*  INR 1.6*   ABG No results for input(s): PHART, HCO3 in the last 72 hours.  Invalid input(s): PCO2, PO2  Studies/Results: DG Chest 2 View  Result Date: 04/07/2020 CLINICAL DATA:  Possible sepsis. EXAM: CHEST - 2 VIEW COMPARISON:  02/25/2020. FINDINGS: Mediastinum and hilar structures normal. Low lung volumes with bibasilar atelectasis/infiltrates again noted. Similar findings noted on prior exam. No definite pleural effusion noted on today's exam. No pneumothorax. Degenerative change thoracic spine. IMPRESSION: Low lung volumes with bibasilar atelectasis and or infiltrates again noted. Similar findings noted on prior exam. No definite pleural effusion noted on today's exam. Electronically Signed   By: [place]   On: 04/07/2020 14:06   CT Angio Chest PE W/Cm &/Or Wo Cm  Result Date: 04/07/2020 CLINICAL DATA:  Sepsis, recent bariatric  surgery EXAM: CT ANGIOGRAPHY CHEST CT ABDOMEN AND PELVIS WITH CONTRAST TECHNIQUE: Multidetector CT imaging of the chest was performed using the standard protocol during bolus administration of intravenous contrast. Multiplanar CT image reconstructions and MIPs were obtained to evaluate the vascular anatomy. Multidetector CT imaging of the abdomen and pelvis was performed using the standard protocol during bolus administration of intravenous contrast. CONTRAST:  111mL OMNIPAQUE IOHEXOL 350 MG/ML SOLN COMPARISON:  03/24/2020 FINDINGS: CTA CHEST FINDINGS Cardiovascular: This is a technically adequate evaluation of the pulmonary vasculature. No filling defects or pulmonary emboli. The heart is unremarkable without pericardial effusion. Normal caliber of the thoracic aorta. Mediastinum/Nodes: No enlarged mediastinal, hilar, or axillary lymph nodes. Thyroid gland, trachea, and esophagus demonstrate no significant findings. Lungs/Pleura: Scattered areas of atelectasis are seen within the right middle and bilateral lower lobes. No airspace disease, effusion, or pneumothorax. Central airways are patent. Musculoskeletal: No acute or destructive bony lesions. Reconstructed images demonstrate no additional findings. Review of the MIP images confirms the above findings. CT ABDOMEN and PELVIS FINDINGS Hepatobiliary: Gallbladder is surgically absent. The liver enhances normally. No biliary dilation. Pancreas: Pancreas enhances normally. Peripancreatic edema is felt to be related to recent abdominal surgery, though pancreatitis cannot be completely excluded. Please correlate with laboratory analysis. Spleen: Borderline splenomegaly is unchanged. Adrenals/Urinary Tract: Adrenal glands are unremarkable. Kidneys are normal, without renal calculi, focal lesion, or hydronephrosis. Bladder is unremarkable. Stomach/Bowel: Evaluation of the bowel and postsurgical abdomen is limited without oral contrast. Postsurgical changes are seen from  bariatric surgery  with staple line at the gastric fundus. Wall thickening is seen within the excluded portion of the gastric fundus. There is edema at the gastrojejunostomy, and evaluation is limited without oral contrast. The percutaneous gastrostomy tube seen previously has been removed in the interim. Continued inflammatory changes within the left upper quadrant mesentery, at the site of previous gas/fluid collection. Small residual area of fluid in the left upper quadrant image 36 measures 0.9 cm in diameter. Mild dilation of the small bowel at the jejunojejunostomy site left lower quadrant. Again, evaluation of the anastomosis is limited without oral contrast. There is no evidence of bowel obstruction. Normal appendix right lower quadrant. Vascular/Lymphatic: Multiple subcentimeter lymph nodes are seen within the central upper abdominal mesentery, reactive. No pathologically enlarged lymph nodes. No significant vascular findings. Reproductive: Exophytic uterine fibroid unchanged. No adnexal masses. Other: Trace free fluid in the bilateral paracolic gutters. No free intraperitoneal gas. Postsurgical changes are seen from prior midline laparotomy, with small fat containing peri-incisional hernia unchanged. No abdominal wall fluid collection. No bowel herniation. Musculoskeletal: No acute or destructive bony lesions. Reconstructed images demonstrate no additional findings. Review of the MIP images confirms the above findings. IMPRESSION: 1. No evidence of pulmonary embolus. 2. Postsurgical changes from bariatric surgery, with gastric wall edema and edema at the gastrojejunostomy. Evaluation of the anastomosis is limited without oral contrast. 3. Persistent inflammatory changes within the left upper quadrant, with subcentimeter residual fluid collection as above. 4. Peripancreatic edema is felt to be related to recent abdominal surgery, though pancreatitis cannot be completely excluded. Please correlate with  laboratory analysis. Electronically Signed   By: Randa Ngo M.D.   On: 04/07/2020 19:00   CT ABDOMEN PELVIS W CONTRAST  Addendum Date: 04/07/2020   ADDENDUM REPORT: 04/07/2020 20:52 ADDENDUM: Agree with Dr. Saul Fordyce assessment. Case was further discussed with PA Hammon as follows: Interval increase in marked left upper quadrant inflammatory changes with worsened inflammation of the fundus/body of the bypassed protion of the stomach as well as the distal bypassed dudoenum/jejunum just proximal to the jejunojejunostomy. Findings suggestive of a marginal ulcer formation. Cannot exclude gastric/small bowel leak. Associated reactive changes of the splenic flexure/proximal transverse colon. Enteric-enteric fistula in the left upper abdomen between the biliopancreatic limb and proximal transverse colon not excluded. Interval increase in distal pancreatic body contour haziness and peripancreatic fluid. Findings likely representative of reactive inflammation; however, acute pancreatitis cannot be excluded. Correlate with lipase levels. No drainable organized fluid collection. Retained LUQ prior surgical drain tract. If subsequent CT abdomen pelvis obtained, consider water-soluble PO contrast for further evaluation. Tiny fat containing incisional hernia with an abdominal defect of 0.6 cm. Healing abdominal incision. No subcutaneous soft tissue organized fluid collection or definite phlegmon. No pneumoperitoneum. No bowel obstruction. These results were called by telephone at the time of interpretation on 04/07/2020 at 7:49 pm to provider Tamala Julian PA , who verbally acknowledged these results. Electronically Signed   By: Iven Finn M.D.   On: 04/07/2020 20:52   Result Date: 04/07/2020 CLINICAL DATA:  Sepsis, recent bariatric surgery EXAM: CT ABDOMEN AND PELVIS WITH CONTRAST TECHNIQUE: Multidetector CT imaging of the abdomen and pelvis was performed using the standard protocol following bolus administration  of intravenous contrast. CONTRAST:  12mL OMNIPAQUE IOHEXOL 350 MG/ML SOLN COMPARISON:  03/24/2020, 02/18/2020 FINDINGS: CTA CHEST FINDINGS Cardiovascular: This is a technically adequate evaluation of the pulmonary vasculature. No filling defects or pulmonary emboli. The heart is unremarkable without pericardial effusion. Normal caliber of the thoracic aorta. Mediastinum/Nodes:  No enlarged mediastinal, hilar, or axillary lymph nodes. Thyroid gland, trachea, and esophagus demonstrate no significant findings. Lungs/Pleura: Scattered areas of atelectasis are seen within the right middle and bilateral lower lobes. No airspace disease, effusion, or pneumothorax. Central airways are patent. Musculoskeletal: No acute or destructive bony lesions. Reconstructed images demonstrate no additional findings. Review of the MIP images confirms the above findings. CT ABDOMEN and PELVIS FINDINGS Hepatobiliary: Gallbladder is surgically absent. The liver enhances normally. No biliary dilation. Pancreas: Pancreas enhances normally. Peripancreatic edema is felt to be related to recent abdominal surgery, though pancreatitis cannot be completely excluded. Please correlate with laboratory analysis. Spleen: Borderline splenomegaly is unchanged. Adrenals/Urinary Tract: Adrenal glands are unremarkable. Kidneys are normal, without renal calculi, focal lesion, or hydronephrosis. Bladder is unremarkable. Stomach/Bowel: Evaluation of the bowel and postsurgical abdomen is limited without oral contrast. Postsurgical changes are seen from bariatric surgery with staple line at the gastric fundus. Wall thickening is seen within the excluded portion of the gastric fundus. There is edema at the gastrojejunostomy, and evaluation is limited without oral contrast. The percutaneous gastrostomy tube seen previously has been removed in the interim. Continued inflammatory changes within the left upper quadrant mesentery, at the site of previous gas/fluid  collection. Small residual area of fluid in the left upper quadrant image 36 measures 0.9 cm in diameter. Mild dilation of the small bowel at the jejunojejunostomy site left lower quadrant. Again, evaluation of the anastomosis is limited without oral contrast. There is no evidence of bowel obstruction. Normal appendix right lower quadrant. Vascular/Lymphatic: Multiple subcentimeter lymph nodes are seen within the central upper abdominal mesentery, reactive. No pathologically enlarged lymph nodes. No significant vascular findings. Reproductive: Exophytic uterine fibroid unchanged. No adnexal masses. Other: Trace free fluid in the bilateral paracolic gutters. No free intraperitoneal gas. Postsurgical changes are seen from prior midline laparotomy, with small fat containing peri-incisional hernia unchanged. No abdominal wall fluid collection. No bowel herniation. Musculoskeletal: No acute or destructive bony lesions. Reconstructed images demonstrate no additional findings. Review of the MIP images confirms the above findings. IMPRESSION: 1. No evidence of pulmonary embolus. 2. Postsurgical changes from bariatric surgery, with gastric wall edema and edema at the gastrojejunostomy. Evaluation of the anastomosis is limited without oral contrast. 3. Persistent inflammatory changes within the left upper quadrant, with subcentimeter residual fluid collection as above. 4. Peripancreatic edema is felt to be related to recent abdominal surgery, though pancreatitis cannot be completely excluded. Please correlate with laboratory analysis. Electronically Signed: By: Randa Ngo M.D. On: 04/07/2020 20:15    Anti-infectives: Anti-infectives (From admission, onward)   Start     Dose/Rate Route Frequency Ordered Stop   04/08/20 0330  vancomycin (VANCOREADY) IVPB 1500 mg/300 mL        1,500 mg 150 mL/hr over 120 Minutes Intravenous Every 12 hours 04/07/20 1707     04/07/20 2300  ceFEPIme (MAXIPIME) 2 g in sodium chloride  0.9 % 100 mL IVPB        2 g 200 mL/hr over 30 Minutes Intravenous Every 8 hours 04/07/20 1707     04/07/20 2230  metroNIDAZOLE (FLAGYL) IVPB 500 mg        500 mg 100 mL/hr over 60 Minutes Intravenous Every 8 hours 04/07/20 2219     04/07/20 1445  vancomycin (VANCOCIN) 2,500 mg in sodium chloride 0.9 % 500 mL IVPB        2,500 mg 250 mL/hr over 120 Minutes Intravenous  Once 04/07/20 1436 04/07/20 1719   04/07/20 1430  ceFEPIme (MAXIPIME) 2 g in sodium chloride 0.9 % 100 mL IVPB        2 g 200 mL/hr over 30 Minutes Intravenous  Once 04/07/20 1424 04/07/20 1605   04/07/20 1430  metroNIDAZOLE (FLAGYL) IVPB 500 mg        500 mg 100 mL/hr over 60 Minutes Intravenous  Once 04/07/20 1424 04/07/20 1605   04/07/20 1430  vancomycin (VANCOCIN) IVPB 1000 mg/200 mL premix  Status:  Discontinued        1,000 mg 200 mL/hr over 60 Minutes Intravenous  Once 04/07/20 1424 04/07/20 1556      Assessment/Plan: S/p gastric bypass 2002, s/p recent exploratory laparotomy/ SBR and abscess drainage in August 2021 by Dr. Greer Pickerel  Now returns with abdominal pain - negative CT scan. Evaluate anastomosis with gastrografin UGI today. If UGI negative, may begin clear liquids Continue antibiotics Protonix Dressing changes - wet to dry to midline wound.  LOS: 1 day    Maia Petties 04/08/2020

## 2020-04-08 NOTE — Progress Notes (Signed)
Initial Nutrition Assessment  DOCUMENTATION CODES:   Morbid obesity  INTERVENTION:  Provide Boost Breeze po TID, each supplement provides 250 kcal and 9 grams of protein.  Provide multivitamin once daily.   Encourage adequate PO intake.  NUTRITION DIAGNOSIS:   Increased nutrient needs related to acute illness (sepsis) as evidenced by estimated needs.  GOAL:   Patient will meet greater than or equal to 90% of their needs  MONITOR:   PO intake, Supplement acceptance, Skin, Weight trends, Labs, I & O's, Diet advancement  REASON FOR ASSESSMENT:   Malnutrition Screening Tool    ASSESSMENT:   39 y.o. female presenting with fever. PMH is significant for recent ex-lap on 8/2 for possible bowel perforation, s/p G tube and JP drain, pancreatic pseudocyst, hx of roux-en-y bypass in 2002, gastric ulcers, and b12 deficiency. Pt with sepsis, likely intraabdominal source  Per upper GI study today with pt with no signs of leak. Diet has been advanced to clear liquids. Pt reports G-tube and JP drain no longer in place. Pt reports she was eating well PTA with no difficulties. Pt does report consuming nutritional shakes/protein shakes at home with either Ensure, premier protein, and/or lactose free milk with flavoring syrup. Pt reports hunger and no abdominal pains at this time. RD to order Boost Breeze to aid in caloric and protein needs. Will additionally order MVI.   Unable to complete Nutrition-Focused physical exam at this time. RD working remotely.  Labs and medications reviewed.   Diet Order:   Diet Order            Diet clear liquid Room service appropriate? Yes; Fluid consistency: Thin  Diet effective now                 EDUCATION NEEDS:   Not appropriate for education at this time  Skin:  Skin Assessment: Reviewed RN Assessment  Last BM:  9/24  Height:   Ht Readings from Last 1 Encounters:  04/07/20 5\' 10"  (1.778 m)    Weight:   Wt Readings from Last 1  Encounters:  04/07/20 129.3 kg   BMI:  Body mass index is 40.9 kg/m.  Estimated Nutritional Needs:   Kcal:  2000-2200  Protein:  115-130 grams  Fluid:  >/= 2 L/day  Corrin Parker, MS, RD, LDN RD pager number/after hours weekend pager number on Amion.

## 2020-04-09 LAB — COMPREHENSIVE METABOLIC PANEL
ALT: 8 U/L (ref 0–44)
AST: 12 U/L — ABNORMAL LOW (ref 15–41)
Albumin: 1.8 g/dL — ABNORMAL LOW (ref 3.5–5.0)
Alkaline Phosphatase: 205 U/L — ABNORMAL HIGH (ref 38–126)
Anion gap: 7 (ref 5–15)
BUN: <5 mg/dL — ABNORMAL LOW (ref 6–20)
CO2: 20 mmol/L — ABNORMAL LOW (ref 22–32)
Calcium: 9.8 mg/dL (ref 8.9–10.3)
Chloride: 109 mmol/L (ref 98–111)
Creatinine, Ser: 0.63 mg/dL (ref 0.44–1.00)
GFR calc Af Amer: >60 mL/min (ref >60)
GFR calc non Af Amer: >60 mL/min (ref >60)
Glucose, Bld: 108 mg/dL — ABNORMAL HIGH (ref 70–99)
Potassium: 3.7 mmol/L (ref 3.5–5.1)
Sodium: 136 mmol/L (ref 135–145)
Total Bilirubin: 0.5 mg/dL (ref 0.3–1.2)
Total Protein: 6.3 g/dL — ABNORMAL LOW (ref 6.5–8.1)

## 2020-04-09 LAB — CBC
HCT: 27.6 % — ABNORMAL LOW (ref 36.0–46.0)
Hemoglobin: 8 g/dL — ABNORMAL LOW (ref 12.0–15.0)
MCH: 24.9 pg — ABNORMAL LOW (ref 26.0–34.0)
MCHC: 29 g/dL — ABNORMAL LOW (ref 30.0–36.0)
MCV: 86 fL (ref 80.0–100.0)
Platelets: 306 10*3/uL (ref 150–400)
RBC: 3.21 MIL/uL — ABNORMAL LOW (ref 3.87–5.11)
RDW: 19.5 % — ABNORMAL HIGH (ref 11.5–15.5)
WBC: 7.1 10*3/uL (ref 4.0–10.5)
nRBC: 0 % (ref 0.0–0.2)

## 2020-04-09 LAB — URINE CULTURE

## 2020-04-09 MED ORDER — METRONIDAZOLE 50 MG/ML ORAL SUSPENSION
500.0000 mg | Freq: Three times a day (TID) | ORAL | Status: DC
  Administered 2020-04-09 – 2020-04-10 (×4): 500 mg via ORAL
  Filled 2020-04-09 (×6): qty 10

## 2020-04-09 MED ORDER — CEFDINIR 250 MG/5ML PO SUSR
300.0000 mg | Freq: Two times a day (BID) | ORAL | Status: DC
  Administered 2020-04-09 – 2020-04-10 (×3): 300 mg via ORAL
  Filled 2020-04-09 (×5): qty 6

## 2020-04-09 MED ORDER — TRAMADOL HCL 50 MG PO TABS
50.0000 mg | ORAL_TABLET | Freq: Once | ORAL | Status: AC
  Administered 2020-04-09: 50 mg via ORAL
  Filled 2020-04-09: qty 1

## 2020-04-09 NOTE — Progress Notes (Signed)
FPTS Interim Progress Note  S: To bedside to check in on patient.  Patient reports that she is having some surgical site pain and pain at her JP drain site.  She is very hungry at this time and is requesting food.  No other complaints.  O: BP 108/60 (BP Location: Right Arm)   Pulse 91   Temp 98.5 F (36.9 C) (Oral)   Resp (!) 24   Ht 5\' 10"  (1.778 m)   Wt 129.3 kg   LMP 02/04/2020 (Exact Date)   SpO2 99%   BMI 40.90 kg/m   General: Well-appearing, sitting up in bed, no acute distress Mild tenderness to palpation of the abdomen throughout, especially at surgical sites.  JP drain site clean, intact, healing well.  A/P: Continue to monitor pain. Continue CLD.   Wilber Oliphant, MD 04/09/2020, 6:23 AM PGY-3, [facility] pager [phone]

## 2020-04-09 NOTE — Progress Notes (Signed)
Subjective/Chief Complaint: Patient very hungry, which is improved from admission Much less abdominal pain UGI - no leak No labs yet today  Objective: Vital signs in last 24 hours: Temp:  [98.1 F (36.7 C)-99 F (37.2 C)] 98.2 F (36.8 C) (09/26 0723) Pulse Rate:  [91-109] 91 (09/26 0823) Resp:  [18-24] 20 (09/26 0823) BP: (97-113)/(55-72) 98/61 (09/26 0823) SpO2:  [95 %-99 %] 99 % (09/26 0823) Last BM Date: 04/07/20  Intake/Output from previous day: 09/25 0701 - 09/26 0700 In: 1972.7 [P.O.:580; I.V.:238.5; IV Piggyback:1154.2] Out: 1 [Stool:1] Intake/Output this shift: No intake/output data recorded.  General appearance: alert, cooperative and no distress Abd - non-tender; wound granulating; drain in place  Lab Results:  Recent Labs    04/07/20 1328 04/08/20 0448  WBC 18.2* 12.8*  HGB 10.5* 8.1*  HCT 35.6* 27.8*  PLT 413* 336   BMET Recent Labs    04/07/20 1600 04/08/20 0448  NA 136 134*  K 3.5 3.4*  CL 108 108  CO2 19* 21*  GLUCOSE 137* 110*  BUN <5* <5*  CREATININE 0.54 0.48  CALCIUM 9.6 9.8   PT/INR Recent Labs    04/07/20 1600  LABPROT 18.7*  INR 1.6*   ABG No results for input(s): PHART, HCO3 in the last 72 hours.  Invalid input(s): PCO2, PO2  Studies/Results: DG Chest 2 View  Result Date: 04/07/2020 CLINICAL DATA:  Possible sepsis. EXAM: CHEST - 2 VIEW COMPARISON:  02/25/2020. FINDINGS: Mediastinum and hilar structures normal. Low lung volumes with bibasilar atelectasis/infiltrates again noted. Similar findings noted on prior exam. No definite pleural effusion noted on today's exam. No pneumothorax. Degenerative change thoracic spine. IMPRESSION: Low lung volumes with bibasilar atelectasis and or infiltrates again noted. Similar findings noted on prior exam. No definite pleural effusion noted on today's exam. Electronically Signed   By: Byron   On: 04/07/2020 14:06   CT Angio Chest PE W/Cm &/Or Wo Cm  Result Date:  04/07/2020 CLINICAL DATA:  Sepsis, recent bariatric surgery EXAM: CT ANGIOGRAPHY CHEST CT ABDOMEN AND PELVIS WITH CONTRAST TECHNIQUE: Multidetector CT imaging of the chest was performed using the standard protocol during bolus administration of intravenous contrast. Multiplanar CT image reconstructions and MIPs were obtained to evaluate the vascular anatomy. Multidetector CT imaging of the abdomen and pelvis was performed using the standard protocol during bolus administration of intravenous contrast. CONTRAST:  187mL OMNIPAQUE IOHEXOL 350 MG/ML SOLN COMPARISON:  03/24/2020 FINDINGS: CTA CHEST FINDINGS Cardiovascular: This is a technically adequate evaluation of the pulmonary vasculature. No filling defects or pulmonary emboli. The heart is unremarkable without pericardial effusion. Normal caliber of the thoracic aorta. Mediastinum/Nodes: No enlarged mediastinal, hilar, or axillary lymph nodes. Thyroid gland, trachea, and esophagus demonstrate no significant findings. Lungs/Pleura: Scattered areas of atelectasis are seen within the right middle and bilateral lower lobes. No airspace disease, effusion, or pneumothorax. Central airways are patent. Musculoskeletal: No acute or destructive bony lesions. Reconstructed images demonstrate no additional findings. Review of the MIP images confirms the above findings. CT ABDOMEN and PELVIS FINDINGS Hepatobiliary: Gallbladder is surgically absent. The liver enhances normally. No biliary dilation. Pancreas: Pancreas enhances normally. Peripancreatic edema is felt to be related to recent abdominal surgery, though pancreatitis cannot be completely excluded. Please correlate with laboratory analysis. Spleen: Borderline splenomegaly is unchanged. Adrenals/Urinary Tract: Adrenal glands are unremarkable. Kidneys are normal, without renal calculi, focal lesion, or hydronephrosis. Bladder is unremarkable. Stomach/Bowel: Evaluation of the bowel and postsurgical abdomen is limited  without oral contrast. Postsurgical changes  are seen from bariatric surgery with staple line at the gastric fundus. Wall thickening is seen within the excluded portion of the gastric fundus. There is edema at the gastrojejunostomy, and evaluation is limited without oral contrast. The percutaneous gastrostomy tube seen previously has been removed in the interim. Continued inflammatory changes within the left upper quadrant mesentery, at the site of previous gas/fluid collection. Small residual area of fluid in the left upper quadrant image 36 measures 0.9 cm in diameter. Mild dilation of the small bowel at the jejunojejunostomy site left lower quadrant. Again, evaluation of the anastomosis is limited without oral contrast. There is no evidence of bowel obstruction. Normal appendix right lower quadrant. Vascular/Lymphatic: Multiple subcentimeter lymph nodes are seen within the central upper abdominal mesentery, reactive. No pathologically enlarged lymph nodes. No significant vascular findings. Reproductive: Exophytic uterine fibroid unchanged. No adnexal masses. Other: Trace free fluid in the bilateral paracolic gutters. No free intraperitoneal gas. Postsurgical changes are seen from prior midline laparotomy, with small fat containing peri-incisional hernia unchanged. No abdominal wall fluid collection. No bowel herniation. Musculoskeletal: No acute or destructive bony lesions. Reconstructed images demonstrate no additional findings. Review of the MIP images confirms the above findings. IMPRESSION: 1. No evidence of pulmonary embolus. 2. Postsurgical changes from bariatric surgery, with gastric wall edema and edema at the gastrojejunostomy. Evaluation of the anastomosis is limited without oral contrast. 3. Persistent inflammatory changes within the left upper quadrant, with subcentimeter residual fluid collection as above. 4. Peripancreatic edema is felt to be related to recent abdominal surgery, though pancreatitis  cannot be completely excluded. Please correlate with laboratory analysis. Electronically Signed   By: Randa Ngo M.D.   On: 04/07/2020 19:00   CT ABDOMEN PELVIS W CONTRAST  Addendum Date: 04/07/2020   ADDENDUM REPORT: 04/07/2020 20:52 ADDENDUM: Agree with Dr. Saul Fordyce assessment. Case was further discussed with PA Hammon as follows: Interval increase in marked left upper quadrant inflammatory changes with worsened inflammation of the fundus/body of the bypassed protion of the stomach as well as the distal bypassed dudoenum/jejunum just proximal to the jejunojejunostomy. Findings suggestive of a marginal ulcer formation. Cannot exclude gastric/small bowel leak. Associated reactive changes of the splenic flexure/proximal transverse colon. Enteric-enteric fistula in the left upper abdomen between the biliopancreatic limb and proximal transverse colon not excluded. Interval increase in distal pancreatic body contour haziness and peripancreatic fluid. Findings likely representative of reactive inflammation; however, acute pancreatitis cannot be excluded. Correlate with lipase levels. No drainable organized fluid collection. Retained LUQ prior surgical drain tract. If subsequent CT abdomen pelvis obtained, consider water-soluble PO contrast for further evaluation. Tiny fat containing incisional hernia with an abdominal defect of 0.6 cm. Healing abdominal incision. No subcutaneous soft tissue organized fluid collection or definite phlegmon. No pneumoperitoneum. No bowel obstruction. These results were called by telephone at the time of interpretation on 04/07/2020 at 7:49 pm to provider Tamala Julian PA , who verbally acknowledged these results. Electronically Signed   By: Iven Finn M.D.   On: 04/07/2020 20:52   Result Date: 04/07/2020 CLINICAL DATA:  Sepsis, recent bariatric surgery EXAM: CT ABDOMEN AND PELVIS WITH CONTRAST TECHNIQUE: Multidetector CT imaging of the abdomen and pelvis was performed using  the standard protocol following bolus administration of intravenous contrast. CONTRAST:  16mL OMNIPAQUE IOHEXOL 350 MG/ML SOLN COMPARISON:  03/24/2020, 02/18/2020 FINDINGS: CTA CHEST FINDINGS Cardiovascular: This is a technically adequate evaluation of the pulmonary vasculature. No filling defects or pulmonary emboli. The heart is unremarkable without pericardial effusion. Normal caliber  of the thoracic aorta. Mediastinum/Nodes: No enlarged mediastinal, hilar, or axillary lymph nodes. Thyroid gland, trachea, and esophagus demonstrate no significant findings. Lungs/Pleura: Scattered areas of atelectasis are seen within the right middle and bilateral lower lobes. No airspace disease, effusion, or pneumothorax. Central airways are patent. Musculoskeletal: No acute or destructive bony lesions. Reconstructed images demonstrate no additional findings. Review of the MIP images confirms the above findings. CT ABDOMEN and PELVIS FINDINGS Hepatobiliary: Gallbladder is surgically absent. The liver enhances normally. No biliary dilation. Pancreas: Pancreas enhances normally. Peripancreatic edema is felt to be related to recent abdominal surgery, though pancreatitis cannot be completely excluded. Please correlate with laboratory analysis. Spleen: Borderline splenomegaly is unchanged. Adrenals/Urinary Tract: Adrenal glands are unremarkable. Kidneys are normal, without renal calculi, focal lesion, or hydronephrosis. Bladder is unremarkable. Stomach/Bowel: Evaluation of the bowel and postsurgical abdomen is limited without oral contrast. Postsurgical changes are seen from bariatric surgery with staple line at the gastric fundus. Wall thickening is seen within the excluded portion of the gastric fundus. There is edema at the gastrojejunostomy, and evaluation is limited without oral contrast. The percutaneous gastrostomy tube seen previously has been removed in the interim. Continued inflammatory changes within the left upper  quadrant mesentery, at the site of previous gas/fluid collection. Small residual area of fluid in the left upper quadrant image 36 measures 0.9 cm in diameter. Mild dilation of the small bowel at the jejunojejunostomy site left lower quadrant. Again, evaluation of the anastomosis is limited without oral contrast. There is no evidence of bowel obstruction. Normal appendix right lower quadrant. Vascular/Lymphatic: Multiple subcentimeter lymph nodes are seen within the central upper abdominal mesentery, reactive. No pathologically enlarged lymph nodes. No significant vascular findings. Reproductive: Exophytic uterine fibroid unchanged. No adnexal masses. Other: Trace free fluid in the bilateral paracolic gutters. No free intraperitoneal gas. Postsurgical changes are seen from prior midline laparotomy, with small fat containing peri-incisional hernia unchanged. No abdominal wall fluid collection. No bowel herniation. Musculoskeletal: No acute or destructive bony lesions. Reconstructed images demonstrate no additional findings. Review of the MIP images confirms the above findings. IMPRESSION: 1. No evidence of pulmonary embolus. 2. Postsurgical changes from bariatric surgery, with gastric wall edema and edema at the gastrojejunostomy. Evaluation of the anastomosis is limited without oral contrast. 3. Persistent inflammatory changes within the left upper quadrant, with subcentimeter residual fluid collection as above. 4. Peripancreatic edema is felt to be related to recent abdominal surgery, though pancreatitis cannot be completely excluded. Please correlate with laboratory analysis. Electronically Signed: By: Randa Ngo M.D. On: 04/07/2020 20:15   DG UGI W SINGLE CM (SOL OR THIN BA)  Result Date: 04/08/2020 CLINICAL DATA:  Patient with recent revised gastric bypass procedure. Evaluate for leak. EXAM: WATER SOLUBLE UPPER GI SERIES TECHNIQUE: Single-column upper GI series was performed using water soluble contrast.  CONTRAST:  Omnipaque oral contrast COMPARISON:  CT abdomen pelvis April 07, 2020 FLUOROSCOPY TIME:  Fluoroscopy Time:  4 minutes Radiation Exposure Index (if provided by the fluoroscopic device): 129.9 Number of Acquired Spot Images: 4 FINDINGS: The oral contrast material flowed freely throughout the distal esophagus into the gastric pouch and jejunum. No evidence for extravasated contrast to suggest leak. The contrast flowed freely out of the gastric pouch into the proximal small bowel. IMPRESSION: No evidence to suggest leak from the revised gastric bypass. Electronically Signed   By: Lovey Newcomer M.D.   On: 04/08/2020 11:11    Anti-infectives: Anti-infectives (From admission, onward)   Start  Dose/Rate Route Frequency Ordered Stop   04/08/20 0330  vancomycin (VANCOREADY) IVPB 1500 mg/300 mL        1,500 mg 150 mL/hr over 120 Minutes Intravenous Every 12 hours 04/07/20 1707     04/07/20 2300  ceFEPIme (MAXIPIME) 2 g in sodium chloride 0.9 % 100 mL IVPB        2 g 200 mL/hr over 30 Minutes Intravenous Every 8 hours 04/07/20 1707     04/07/20 2230  metroNIDAZOLE (FLAGYL) IVPB 500 mg        500 mg 100 mL/hr over 60 Minutes Intravenous Every 8 hours 04/07/20 2219     04/07/20 1445  vancomycin (VANCOCIN) 2,500 mg in sodium chloride 0.9 % 500 mL IVPB        2,500 mg 250 mL/hr over 120 Minutes Intravenous  Once 04/07/20 1436 04/07/20 1719   04/07/20 1430  ceFEPIme (MAXIPIME) 2 g in sodium chloride 0.9 % 100 mL IVPB        2 g 200 mL/hr over 30 Minutes Intravenous  Once 04/07/20 1424 04/07/20 1605   04/07/20 1430  metroNIDAZOLE (FLAGYL) IVPB 500 mg        500 mg 100 mL/hr over 60 Minutes Intravenous  Once 04/07/20 1424 04/07/20 1605   04/07/20 1430  vancomycin (VANCOCIN) IVPB 1000 mg/200 mL premix  Status:  Discontinued        1,000 mg 200 mL/hr over 60 Minutes Intravenous  Once 04/07/20 1424 04/07/20 1556      Assessment/Plan: S/p gastric bypass 2002, s/p recent exploratory  laparotomy/ SBR and abscess drainage in August 2021 by Dr. Greer Pickerel No sign of anastomotic leak Empiric antibiotics for intra-abdominal infection - would switch to PO.  May need liquid form since patient has trouble with large pills. Dressing changes - wet to dry to midline wound.  Advance diet to bari full liquids Probably ready for discharge tomorrow. Follow-up with Dr. Greer Pickerel  LOS: 2 days    Maia Petties 04/09/2020

## 2020-04-09 NOTE — Progress Notes (Signed)
Received page regarding patient having abdominal pain and requesting medications for this.  Went to evaluate patient and her she reports that the pain is the same pain that she is been having throughout this hospitalization.  She reported that the tramadol helped greatly last night and would like another dose tonight.  Physical exam showed mild diffuse abdominal pain with positive bowel sounds consistent with previous physical exams.  She also reported her leg tingling is helped with the Neurontin.  Order placed for 50 mg one-time dose of tramadol.

## 2020-04-09 NOTE — Hospital Course (Addendum)
Alexis Avila is a 39 year old female presenting with fever. PMH significant for recent ex lap on 8/30 for possible bowel perforation, s/p G-tube and JP drain, pancreatic pseudocyst, Hx of Roux-en-Y bypass in 2002, gastric ulcers, and B12 deficiency  Concern for sepsis, likely intra-abdominal source  abdominal pain Initial severe sepsis due to fever, tachycardia, tachypnea, leukocytosis, and elevated lactic acid. Patient was treated with fluids and started on broad spectrum antibiotics (vacomycin, cefepime, metronidazole).  Blood culture collected with no growth at 2 days; urine culture collected but noted many bacteria and was likely contaminated. Concern for abdominal sources due to abdominal pain and history of bowel perforation during a procedure and recently had had her G-tube and JP drain removed on 9/15. Surgery was consulted and CT imaging showed no drainable fluid collection. Patient was started on IV vacomycin x2 days, IV cefepime x2 days, and IV metronidazole x2 days. Patient antibiotics were narrowed to cefdinir and metronidazole and continued for a total of 10 days of antibiotics.   Tachypnea, resolved Patient initially presented with respiratory rates in the 40s. CXR showed no change from prior studies. CTA was obtained and showed no evidence of PE. Tachypnea likely due to sepsis and volume depletion, with improvement and resolution after fluid resuscitation and initiation of antibiotics.   Leg Tingling, improved Patient has had bilateral leg tingling since surgery in august. 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   Normocytic anemia, history of B12 deficiency, s/p gastric bypass Hemoglobin on arrival was 10.5, which was improved from prior hospital discharge 1 month ago.  During her stay she received two 1 L boluses of fluid as well as maintenance fluids, which could attribute for a dilutional effect.  We will need follow-up outpatient to ensure globin remains  stable.   Issues for follow-up -- recommend primary care follow-up for CBC to monitor WBC and hemoglobin for stability. -Recommend patient to follow-up with Dr. Greer Pickerel for general surgery

## 2020-04-09 NOTE — Progress Notes (Signed)
Attempted to draw back on midline for labs, no blood return noted. Phlebotomy called to draw labs.

## 2020-04-09 NOTE — Progress Notes (Signed)
Pharmacy Antibiotic Note  Alexis Avila is a 39 y.o. female admitted on 04/07/2020 with sepsis secondary to suspected intrabdominal infection.  Pharmacy has been consulted for cefdinir and metronidazole dosing. Patient does not do well with pills or capsules per physician, therefore they are requesting oral solutions. Patient is currently afebrile and WBC are trending down. LA is 1.9.  Surgery ok with narrowing abx and transitioning to oral options. Of note, patient has documented penicillin allergy but has tolerated ceftriaxone and cefepime in the past.   Plan: Discontinue vancomycin, cefepime Start Flagyl 500mg  Q8H oral solution  Starte cefdinir 300mg  Q12H oral solution  Follow up length of therapy and clinical status.   Height: 5\' 10"  (177.8 cm) Weight: 129.3 kg (285 lb 0.9 oz) IBW/kg (Calculated) : 68.5  Temp (24hrs), Avg:98.5 F (36.9 C), Min:98.1 F (36.7 C), Max:99 F (37.2 C)  Recent Labs  Lab 04/07/20 1327 04/07/20 1328 04/07/20 1600 04/08/20 0448  WBC  --  18.2*  --  12.8*  CREATININE  --   --  0.54 0.48  LATICACIDVEN 2.9*  --  1.9  --     Estimated Creatinine Clearance: 139.7 mL/min (by C-G formula based on SCr of 0.48 mg/dL).    Allergies  Allergen Reactions  . Nsaids Other (See Comments)    H/o gastric bypass, h/o ulcers  . Lactose Intolerance (Gi) Other (See Comments)  . Penicillins Hives    Tolerated ceftriaxone 02/2020  Did it involve swelling of the face/tongue/throat, SOB, or low BP? N Did it involve sudden or severe rash/hives, skin peeling, or any reaction on the inside of your mouth or nose? N Did you need to seek medical attention at a hospital or doctor's office? N When did it last happen?Several Years Ago Itching If all above answers are "NO", may proceed with cephalosporin use.     Antimicrobials this admission: Vancomycin 9/24>> Cefepime 9/24>> Metronidazole 9/24>> Cefdinir 9/26>>  Microbiology results: 9/24 BCx: pending 9/24  UCx: multiple species 9/24 MRSA PCR: neg   Thank you for allowing pharmacy to be a part of this patient's care.  Cephus Slater, PharmD, MBA Pharmacy Resident [phone] 04/09/2020 11:20 AM

## 2020-04-09 NOTE — Progress Notes (Signed)
RN notified MD on call regarding patient complaining of abdominal discomfort and requesting for Tramadol as she takes Tramadol at home for discomfort. MD on call in to see patient and spoke with patient.

## 2020-04-09 NOTE — Progress Notes (Signed)
Family Medicine Teaching Service Daily Progress Note Intern Pager: I return next -  Patient name: Alexis Avila Medical record number: [id number] Date of birth: 23-Aug-1980 Age: 39 y.o. Gender: female  Primary Care Provider: [name], MD Consultants: Surgery Code Status: Full  Pt Overview and Major Events to Date:  Alexis Avila is a 39 year old female presenting with fever.  PMH significant for recent ex lap on 8/30 for possible bowel perforation, s/p G tube and JP drain pancreatic pseudocyst, history of Roux-en-y bypass in 2002, gastric ulcers, and B12 deficiency.  Assessment and Plan:  Concern for sepsis, likely intra-abdominal source  abdominal pain Patient afebrile, nontachycardic this morning, no tachypnea, labs pending for this morning.  Blood pressures has been on the lower side overnight with the lowest reading being 97/59.  Patient physical exam significant for abdominal tenderness.  Upper GI series on 9/24 showed no evidence to suggest leak from the revised gastric bypass.  Patient received IV cefepime x2 days, metronidazole IV x2 days, IV Vancomycin x2 days.  Patient being continued on cefdinir oral suspension, and metronidazole oral suspension. Today is day 3 of 10 total abx days. -Discontinue vancomycin -Transition metronidazole to oral suspension -Transition IV ceftriaxone to oral suspension cefdinir monitor WBC -Continue tramadol -Continue Protonix   Bilateral leg tingling Patient reports that leg symptoms were improved with gabapentin last night. 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  Home meds include Carafate and Protonix -IV Protonix  Normocytic anemia, history of B12 deficiency, s/p gastric bypass Hemoglobin this morning is pending.  Most recent baseline Hgb was 9 from 1 month ago. -Daily CBCs -Transfusion threshold <7  Tachypnea,  stable Patient was not tachypneic when in the room, overnight there were 2 reported vital signs of 24 respirations per minute. -Continuous pulse ox and cardiac monitoring  FEN/GI: Bari full liquids PPx: Lovenox   Status is: Inpatient  Remains inpatient appropriate because:Inpatient level of care appropriate due to severity of illness   Dispo:  Patient From: Home  Planned Disposition: Home  Expected discharge date: 04/12/20  Medically stable for discharge: No    Subjective:  Patient reports that breathing is doing much better she does not feel labored with her breathing. Her leg tingling got better after taking the gabapentin, but now she feels as though her legs are "heavy".  Patient is still having abdominal pain, especially on the left side at the surgical site.  Objective: Temp:  [98.1 F (36.7 C)-99 F (37.2 C)] 98.5 F (36.9 C) (09/26 0400) Pulse Rate:  [91-109] 91 (09/26 0400) Resp:  [18-24] 24 (09/25 1930) BP: (99-113)/(55-72) 108/60 (09/26 0400) SpO2:  [95 %-99 %] 99 % (09/26 0400) Physical Exam: General: Well-appearing, sitting up in bed, in NAD Cardiovascular: RRR, no M/R/G, 2+ peripheral pulses Respiratory: CTAB, normal WOB Abdomen: Soft, obese, tender to palpation especially on left side, wet-do-dry dressings  Laboratory: Recent Labs  Lab 04/07/20 1328 04/08/20 0448  WBC 18.2* 12.8*  HGB 10.5* 8.1*  HCT 35.6* 27.8*  PLT 413* 336   Recent Labs  Lab 04/07/20 1600 04/08/20 0448  NA 136 134*  K 3.5 3.4*  CL 108 108  CO2 19* 21*  BUN <5* <5*  CREATININE 0.54 0.48  CALCIUM 9.6 9.8  PROT 6.4* 6.0*  BILITOT 1.0 0.7  ALKPHOS 234* 225*  ALT 9 8  AST 16 14*  GLUCOSE  137* 110*      Imaging/Diagnostic Tests: No results found.    Rise Patience, DO 04/09/2020, 6:54 AM PGY-1, [facility] Intern pager: [phone], text pages welcome

## 2020-04-09 NOTE — TOC Initial Note (Signed)
Transition of Care [facility]) - Initial/Assessment Note    Patient Details  Name: Alexis Avila MRN: [id number] Date of Birth: 10-29-1980  Transition of [facility]) CM/SW Contact:    Carles Collet, RN Phone Number: 04/09/2020, 11:35 AM  Clinical Narrative:        Damaris Schooner w patient to discuss DC planning needs. Patient states that she had a wound VAC that was taken off over a week ago, she has it at home and plans on getting it returned to the company. She states that she has been doing wet to dry dressings with the help of a HH RN through [place] since. She would like to continue that current plan at DC.  [facility] notified, will need resumption orders.            Expected Discharge Plan: [place] Barriers to Discharge: Continued Medical Work up   Patient Goals and CMS Choice        Expected Discharge Plan and Services Expected Discharge Plan: [place]                           DME Agency: [facility]       HH Arranged: RN [facility] Agency: [place] Date [place]: 04/09/20 Time HH Agency Contacted: 47 Representative spoke with at [place]: Tommi Rumps  Prior Living Arrangements/Services                       Activities of Daily Living Home Assistive Devices/Equipment: None ADL Screening (condition at time of admission) Patient's cognitive ability adequate to safely complete daily activities?: Yes Is the patient deaf or have difficulty hearing?: No Does the patient have difficulty seeing, even when wearing glasses/contacts?: No Does the patient have difficulty concentrating, remembering, or making decisions?: No Patient able to express need for assistance with ADLs?: Yes Does the patient have difficulty dressing or bathing?: No Independently performs ADLs?: Yes (appropriate for developmental age) Does the patient have difficulty walking or climbing stairs?: No Weakness of Legs: None Weakness of  Arms/Hands: None  Permission Sought/Granted                  Emotional Assessment              Admission diagnosis:  Sepsis ([place]) [A41.9] Sepsis without acute organ dysfunction, due to unspecified organism [facility]) [A41.9] Patient Active Problem List   Diagnosis Date Noted  . Electrolyte abnormality 03/10/2020  . Fever   . Splenomegaly   . Sepsis ([place]) 02/26/2020  . Hyponatremia   . Intra-abdominal abscess ([place])   . Poor venous access   . Hypophosphatemia   . SOB (shortness of breath)   . Lower extremity edema   . Dyspnea   . Status post exploratory laparotomy   . Abdominal pain   . Elevated LFTs   . Marginal ulcers   . History of Roux-en-Y gastric bypass   . Intractable nausea and vomiting 02/09/2020  . Hypokalemia   . Pseudocyst of pancreas   . SBO (small bowel obstruction) ([place])   . History of pancreatitis 07/20/2019  . 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 Anemia, B12 deficiency 07/20/2019  . Current smoker 07/20/2019  . Chronic back pain 07/20/2019  . Ovarian cyst 07/20/2019  . GERD (gastroesophageal reflux disease) 07/20/2019  . Morbid obesity ([place]) 07/20/2019   PCP:  [name], MD Pharmacy:   [facility]  [place], [place] [address] Phone: [phone] Fax: [phone]     Social Determinants of Health (SDOH) Interventions    Readmission Risk Interventions No flowsheet data found.

## 2020-04-10 ENCOUNTER — Other Ambulatory Visit: Payer: Self-pay

## 2020-04-10 LAB — COMPREHENSIVE METABOLIC PANEL
ALT: 7 U/L (ref 0–44)
AST: 15 U/L (ref 15–41)
Albumin: 1.7 g/dL — ABNORMAL LOW (ref 3.5–5.0)
Alkaline Phosphatase: 217 U/L — ABNORMAL HIGH (ref 38–126)
Anion gap: 6 (ref 5–15)
BUN: <5 mg/dL — ABNORMAL LOW (ref 6–20)
CO2: 21 mmol/L — ABNORMAL LOW (ref 22–32)
Calcium: 9.6 mg/dL (ref 8.9–10.3)
Chloride: 109 mmol/L (ref 98–111)
Creatinine, Ser: 0.53 mg/dL (ref 0.44–1.00)
GFR calc Af Amer: >60 mL/min (ref >60)
GFR calc non Af Amer: >60 mL/min (ref >60)
Glucose, Bld: 127 mg/dL — ABNORMAL HIGH (ref 70–99)
Potassium: 3.9 mmol/L (ref 3.5–5.1)
Sodium: 136 mmol/L (ref 135–145)
Total Bilirubin: 0.6 mg/dL (ref 0.3–1.2)
Total Protein: 6.1 g/dL — ABNORMAL LOW (ref 6.5–8.1)

## 2020-04-10 LAB — CBC
HCT: 26.2 % — ABNORMAL LOW (ref 36.0–46.0)
Hemoglobin: 7.8 g/dL — ABNORMAL LOW (ref 12.0–15.0)
MCH: 25.5 pg — ABNORMAL LOW (ref 26.0–34.0)
MCHC: 29.8 g/dL — ABNORMAL LOW (ref 30.0–36.0)
MCV: 85.6 fL (ref 80.0–100.0)
Platelets: 352 10*3/uL (ref 150–400)
RBC: 3.06 MIL/uL — ABNORMAL LOW (ref 3.87–5.11)
RDW: 19.7 % — ABNORMAL HIGH (ref 11.5–15.5)
WBC: 7.7 10*3/uL (ref 4.0–10.5)
nRBC: 0 % (ref 0.0–0.2)

## 2020-04-10 MED ORDER — PROSIGHT PO TABS
1.0000 | ORAL_TABLET | Freq: Every day | ORAL | Status: DC
  Administered 2020-04-10: 1 via ORAL

## 2020-04-10 MED ORDER — GABAPENTIN 100 MG PO CAPS
100.0000 mg | ORAL_CAPSULE | Freq: Three times a day (TID) | ORAL | 0 refills | Status: DC | PRN
Start: 2020-04-10 — End: 2020-05-09

## 2020-04-10 MED ORDER — ENSURE MAX PROTEIN PO LIQD
11.0000 [oz_av] | Freq: Two times a day (BID) | ORAL | Status: DC
  Administered 2020-04-10: 11 [oz_av] via ORAL
  Filled 2020-04-10: qty 330

## 2020-04-10 MED ORDER — TRAMADOL HCL 50 MG PO TABS: 50.0000 mg | ORAL_TABLET | Freq: Four times a day (QID) | ORAL | 0 refills | Status: DC | PRN

## 2020-04-10 MED ORDER — CEFDINIR 250 MG/5ML PO SUSR: 300.0000 mg | Freq: Two times a day (BID) | ORAL | 0 refills | Status: AC

## 2020-04-10 MED ORDER — POLYETHYLENE GLYCOL 3350 17 G PO PACK: 17.0000 g | PACK | Freq: Every day | ORAL | 0 refills | Status: DC

## 2020-04-10 MED ORDER — PANTOPRAZOLE SODIUM 40 MG PO TBEC
40.0000 mg | DELAYED_RELEASE_TABLET | Freq: Every day | ORAL | 0 refills | Status: DC
Start: 2020-04-11 — End: 2020-05-09

## 2020-04-10 MED ORDER — PANTOPRAZOLE SODIUM 40 MG PO TBEC
40.0000 mg | DELAYED_RELEASE_TABLET | Freq: Every day | ORAL | Status: DC
  Administered 2020-04-10: 40 mg via ORAL
  Filled 2020-04-10: qty 1

## 2020-04-10 MED ORDER — METRONIDAZOLE 50 MG/ML ORAL SUSPENSION: 500.0000 mg | Freq: Three times a day (TID) | ORAL | 0 refills | Status: DC

## 2020-04-10 NOTE — Discharge Summary (Signed)
[facility] Discharge Summary  Patient name: Alexis Avila Medical record number: [id number] Date of birth: 1981-07-01 Age: 39 y.o. Gender: female Date of Admission: 04/07/2020  Date of Discharge: 04/10/2020 Admitting Physician: Alcus Dad, MD  Primary Care Provider: [name], MD Consultants: Surgery  Indication for Hospitalization: Fever   Discharge Diagnoses/Problem List:  Patient Active Problem List   Diagnosis Date Noted  . Electrolyte abnormality 03/10/2020  . Fever   . Splenomegaly   . Sepsis ([place]) 02/26/2020  . Hyponatremia   . Intra-abdominal abscess ([place])   . Poor venous access   . Hypophosphatemia   . SOB (shortness of breath)   . Lower extremity edema   . Dyspnea   . Status post exploratory laparotomy   . Abdominal pain   . Elevated LFTs   . Marginal ulcers   . History of Roux-en-Y gastric bypass   . Intractable nausea and vomiting 02/09/2020  . Hypokalemia   . Pseudocyst of pancreas   . SBO (small bowel obstruction) ([place])   . History of pancreatitis 07/20/2019  . 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 Anemia, B12 deficiency 07/20/2019  . Current smoker 07/20/2019  . Chronic back pain 07/20/2019  . Ovarian cyst 07/20/2019  . GERD (gastroesophageal reflux disease) 07/20/2019  . Morbid obesity ([place]) 07/20/2019     Disposition: Home  Discharge Condition: Stable, improved  Discharge Exam:  General: Well-appearing, sitting up in bed, NAD Cardiovascular: RRR, no M/R/G appreciated, 2+ peripheral pulses, trace edema in b/l LE Respiratory: CTAB, normal WOB Abdomen: Soft, obese, tender to light palpation, no drainage noted on dressings  Brief Hospital Course:  Alexis Avila is a 39 year old female presenting with fever. PMH significant for recent ex lap on 8/30 for possible bowel perforation, s/p G-tube and JP drain, pancreatic pseudocyst, Hx of Roux-en-Y bypass in 2002, gastric ulcers, and B12 deficiency  Concern for sepsis,  likely intra-abdominal source  abdominal pain Initial severe sepsis due to fever, tachycardia, tachypnea, leukocytosis, and elevated lactic acid. Patient was treated with fluids and started on broad spectrum antibiotics (vacomycin, cefepime, metronidazole).  Blood culture collected with no growth at 2 days; urine culture collected but noted many bacteria and was likely contaminated. Concern for abdominal sources due to abdominal pain and history of bowel perforation during a procedure and recently had had her G-tube and JP drain removed on 9/15. Surgery was consulted and CT imaging showed no drainable fluid collection. Patient was started on IV vacomycin x2 days, IV cefepime x2 days, and IV metronidazole x2 days. Patient antibiotics were narrowed to cefdinir and metronidazole and continued for a total of 10 days of antibiotics.   Tachypnea, resolved Patient initially presented with respiratory rates in the 40s. CXR showed no change from prior studies. CTA was obtained and showed no evidence of PE. Tachypnea likely due to sepsis and volume depletion, with improvement and resolution after fluid resuscitation and initiation of antibiotics.   Leg tingling, improved Patient has had bilateral leg tingling since surgery in August. 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 Patient also has history of B12 deficiency which could be contributing to neuropathic pain.  Patient given gabapentin 100 mg 3 times daily with improvement.  Normocytic anemia  history of B12 deficiency, s/p gastric bypass, stable Hemoglobin on arrival was 10.5, which was improved from prior hospital discharge 1 month ago.  During her stay she received two 1 L boluses of fluid as well as maintenance fluids, which could attribute for a dilutional effect.  We will need follow-up outpatient to ensure hemoglobin remains stable.    Issues for Follow Up:  1. Recommend PCP follow-up with labs.  Needs CBC to check on H&H stability,  and to check WBC to ensure resolution of leukocytosis. 2. Recommend PCP follow-up discussion and decision on continuation of gabapentin 100 mg 3 times daily PRN.  Significant Procedures: None  Significant Labs and Imaging:  Recent Labs  Lab 04/08/20 0448 04/09/20 1843 04/10/20 0223  WBC 12.8* 7.1 7.7  HGB 8.1* 8.0* 7.8*  HCT 27.8* 27.6* 26.2*  PLT 336 306 352   Recent Labs  Lab 04/07/20 1600 04/07/20 1600 04/08/20 0448 04/08/20 0448 04/09/20 1843 04/10/20 0223  NA 136  --  134*  --  136 136  K 3.5   < > 3.4*   < > 3.7 3.9  CL 108  --  108  --  109 109  CO2 19*  --  21*  --  20* 21*  GLUCOSE 137*  --  110*  --  108* 127*  BUN <5*  --  <5*  --  <5* <5*  CREATININE 0.54  --  0.48  --  0.63 0.53  CALCIUM 9.6  --  9.8  --  9.8 9.6  ALKPHOS 234*  --  225*  --  205* 217*  AST 16  --  14*  --  12* 15  ALT 9  --  8  --  8 7  ALBUMIN 1.9*  --  1.7*  --  1.8* 1.7*   < > = values in this interval not displayed.      Results/Tests Pending at Time of Discharge: None  Discharge Medications:  Allergies as of 04/10/2020      Reactions   Nsaids Other (See Comments)   H/o gastric bypass, h/o ulcers   Lactose Intolerance (gi) Other (See Comments)   Penicillins Hives   Tolerated ceftriaxone 02/2020, cefdinir and cefepime 03/2020 Did it involve swelling of the face/tongue/throat, SOB, or low BP? N Did it involve sudden or severe rash/hives, skin peeling, or any reaction on the inside of your mouth or nose? N Did you need to seek medical attention at a hospital or doctor's office? N When did it last happen?Several Years Ago Itching If all above answers are "NO", may proceed with cephalosporin use.      Medication List    STOP taking these medications   acetaminophen 160 MG/5ML solution Commonly known as: TYLENOL   furosemide 20 MG tablet Commonly known as: LASIX   magnesium gluconate 30 MG tablet Commonly known as: MAGONATE   oxycodone 5 MG capsule Commonly known  as: OXY-IR   polyethylene glycol powder 17 GM/SCOOP powder Commonly known as: GLYCOLAX/MIRALAX Replaced by: polyethylene glycol 17 g packet   Potassium Chloride ER 20 MEQ Tbcr   potassium phosphate (monobasic) 500 MG tablet Commonly known as: K-PHOS ORIGINAL     TAKE these medications   cefdinir 250 MG/5ML suspension Commonly known as: OMNICEF Take 6 mLs (300 mg total) by mouth 2 (two) times daily for 13 doses.   gabapentin 100 MG capsule Commonly known as: NEURONTIN Take 1 capsule (100 mg total) by mouth 3 (three) times daily as needed (neuropathic pain).   metroNIDAZOLE 50 mg/ml oral suspension Commonly known as: FLAGYL Take 10 mLs (500 mg total) by mouth 3 (three) times daily.   multivitamin with minerals tablet Take 1 tablet by mouth daily.   pantoprazole 40 MG tablet Commonly known as: PROTONIX Take  1 tablet (40 mg total) by mouth daily. Start taking on: April 11, 2020 What changed: when to take this   polyethylene glycol 17 g packet Commonly known as: MiraLax Take 17 g by mouth daily. Replaces: polyethylene glycol powder 17 GM/SCOOP powder   sucralfate 1 g tablet Commonly known as: CARAFATE Take 1 tablet (1 g total) by mouth 4 (four) times daily as needed. What changed: when to take this   traMADol 50 MG tablet Commonly known as: Ultram Take 1 tablet (50 mg total) by mouth every 6 (six) hours as needed.            Discharge Care Instructions  (From admission, onward)         Start     Ordered   04/10/20 0000  Discharge wound care:       Comments: Wet to dry dressing   04/10/20 1228          Discharge Instructions: Please refer to Patient Instructions section of EMR for full details.  Patient was counseled important signs and symptoms that should prompt return to medical care, changes in medications, dietary instructions, activity restrictions, and follow up appointments.   Follow-Up Appointments:  Follow-up Information    Greer Pickerel,  MD. Go on 04/20/2020.   Specialty: General Surgery Why: Appointment scheduled for 11:15 AM. Please arrive 15 min prior to appointment time.  Contact information: [address] [place] [id number] [phone]        Benay Pike, MD. Go on 04/11/2020.   Specialty: Family Medicine Why: Please arrive at 3:55 PM for a 4:10 PM appointment Contact information: [address] [place] [address] [place], [place], DO 04/10/2020, 12:29 PM PGY-1, [place]

## 2020-04-10 NOTE — Progress Notes (Signed)
Family Medicine Teaching Service Daily Progress Note Intern Pager: I return next -  Patient name: Alexis Avila Medical record number: [id number] Date of birth: [birth year] Age: 39 y.o. Gender: female  Primary Care Provider: [name], MD Consultants: Surgery Code Status: Full  Pt Overview and Major Events to Date:  Alexis Avila is a 39 year old female presenting with fever.  PMH significant for recent ex lap on 8/30 for possible bowel perforation, s/p G tube and JP drain pancreatic pseudocyst, history of Roux-en-y bypass in 2002, gastric ulcers, and B12 deficiency.  Assessment and Plan:  Concern for sepsis, likely intra-abdominal source  abdominal pain Patient was afebrile this morning, no tachycardia, no tachypnea.  Blood pressures remained on the lower side, patient asymptomatic.Patient received IV cefepime x2 days, metronidazole IV x2 days, IV Vancomycin x2 days.  Patient being continued on cefdinir oral suspension, and metronidazole oral suspension. Today is day 4 of 10 total abx days. -Continue oral metronidazole (day 4/10) -Continue oral cefdinir (day 2/8, which will complete 10 total days antibiotics) -Continue tramadol -Continue Protonix   Normocytic anemia, history of B12 deficiency, s/p gastric bypass Hemoglobin this morning is 7.8, on admission patient hgb was 10.5.  Most recent baseline Hgb was 9 from 1 month ago. Patient was fluid resuscitated on admission, which could account for a dilutional component of hemoglobin decreased since admission.  Will need close follow-up to ensure hemoglobin stable. -Daily CBCs -Transfusion threshold <7  Bilateral leg tingling Patient reports that leg symptoms were improved with gabapentin last night. -Continue gabapentin 100 mg TID PRN    Marginal ulcer History of marginal ulcer in the past, CT on admission showed ulcer formation but unclear if this is new.  Home meds include Carafate and Protonix -IV Protonix  Tachypnea,  resolved  FEN/GI: [place] full liquids PPx: Lovenox   Status is: Inpatient  Remains inpatient appropriate because:Inpatient level of care appropriate due to severity of illness   Dispo:  Patient From: Home  Planned Disposition: Home  Expected discharge date: 04/12/20  Medically stable for discharge: No    Subjective:  Patient reports that she feels better today, thinks she is ready to go home.  She states that the gabapentin has helped her leg tingling, but her legs currently feel heavy (which she attributes to her leg swelling from her fluids).  Abdominal pain remains the same, improved with tramadol.  Patient reports difficulty sleeping overnight.  Objective: Temp:  [97.6 F (36.4 C)-98.2 F (36.8 C)] 98.2 F (36.8 C) (09/27 0235) Pulse Rate:  [90-91] 90 (09/26 1935) Resp:  [20-24] 20 (09/26 0823) BP: (94-100)/(58-61) 100/58 (09/26 1645) SpO2:  [99 %] 99 % (09/26 0823) Physical Exam: General: Well-appearing, sitting up in bed, NAD Cardiovascular: RRR, no M/R/G appreciated, 2+ peripheral pulses, trace edema in b/l LE Respiratory: CTAB, normal WOB Abdomen: Soft, obese, tender to light palpation, no drainage noted on dressings  Laboratory: Recent Labs  Lab 04/08/20 0448 04/09/20 1843 04/10/20 0223  WBC 12.8* 7.1 7.7  HGB 8.1* 8.0* 7.8*  HCT 27.8* 27.6* 26.2*  PLT 336 306 352   Recent Labs  Lab 04/08/20 0448 04/09/20 1843 04/10/20 0223  NA 134* 136 136  K 3.4* 3.7 3.9  CL 108 109 109  CO2 21* 20* 21*  BUN <5* <5* <5*  CREATININE 0.48 0.63 0.53  CALCIUM 9.8 9.8 9.6  PROT 6.0* 6.3* 6.1*  BILITOT 0.7 0.5 0.6  ALKPHOS 225* 205* 217*  ALT 8 8 7   AST 14* 12* 15  GLUCOSE  110* 108* 127*      Imaging/Diagnostic Tests: No results found.    Rise Patience, DO 04/10/2020, 5:49 AM PGY-1, [place] Intern pager: [phone], text pages welcome

## 2020-04-10 NOTE — Progress Notes (Addendum)
Nutrition Follow-up  DOCUMENTATION CODES:   Morbid obesity  INTERVENTION:  D/c Boost Breeze  Ensure Max po BID, each supplement provides 150 kcal and 30 grams of protein  MVI daily   When d/c home, recommend vitamin supplementation that includes 3000 IU of Vitamin D3, 12 mg of thiamin, 18-60 mg of iron, and 350-500 mcg of Vitamin B12.   NUTRITION DIAGNOSIS:   Increased nutrient needs related to acute illness (sepsis) as evidenced by estimated needs.  Ongoing  GOAL:   Patient will meet greater than or equal to 90% of their needs  Progressing  MONITOR:   PO intake, Supplement acceptance, Skin, Weight trends, Labs, I & O's, Diet advancement  REASON FOR ASSESSMENT:   Malnutrition Screening Tool    ASSESSMENT:   39 y.o. female presenting with fever. PMH is significant for recent ex-lap on 8/2 for possible bowel perforation, s/p G tube and JP drain, pancreatic pseudocyst, hx of roux-en-y bypass in 2002, gastric ulcers, and b12 deficiency. Pt with sepsis, likely intraabdominal source  9/25 diet advanced to clear liquids 9/26 diet advanced to bariatric full liquids  Pt unavailable at time of RD visit.   Noted that pt continues to endorse abdominal pain, but reports this is improved with medication.   Per RN, pt has not been consuming Boost Breeze well (ordered TID). Now that pt's diet has been advanced to Bariatric full liquids, will d/c Boost and order Ensure Max.  Per RD notes from admission in August, pt had a partial resection/reconstruction of the roux limb. This is why pt has been placed on a Bariatric full liquid diet. During previous admission, pt was able to tolerate two Ensure Max supplements per day.   Unsure if pt began taking recommended vitamin regimen after being discharged. Continue to recommend that pt follow vitamin regimen that includes 3000 IU of Vitamin D3, 12 mg of thiamin, 18-60 mg of iron, and 350-500 mcg of Vitamin B12. Pt previously met with  Bariatric Nurse Coordinator to discuss diet/vitamin suggestions.    No PO intake documented.   Labs: Na 134 (L), K+ 3.4 (L) Medications: Flagyl, Protonix  Diet Order:   Diet Order            Diet bariatric full liquid Room service appropriate? Yes; Fluid consistency: Thin  Diet effective now                 EDUCATION NEEDS:   Not appropriate for education at this time  Skin:  Skin Assessment: Skin Integrity Issues: Skin Integrity Issues:: Incisions Incisions: abdomen  Last BM:  9/26 type 6  Height:   Ht Readings from Last 1 Encounters:  04/07/20 5' 10" (1.778 m)    Weight:   Wt Readings from Last 1 Encounters:  04/07/20 129.3 kg   BMI:  Body mass index is 40.9 kg/m.  Estimated Nutritional Needs:   Kcal:  2000-2200  Protein:  115-130 grams  Fluid:  >/= 2 L/day    Amanda Averett, MS, RD, LDN RD pager number and weekend/on-call pager number located in [place].  

## 2020-04-10 NOTE — Discharge Instructions (Signed)
It was a pleasure to be a part of your care while you were in the hospital! You were admitted abdominal pain with concern for sepsis. While you were here you were treated with antibiotics and will be sent home more antibiotics. You also have a history of anemia and your hemoglobin was lower than usual while you were here, we have scheduled a follow-up appointment in our clinic to draw more labs to ensure you are not losing blood. You have a stomach ulcer and we are continuing to give you a medication to help treat it.   The following medications are being prescribed: 1. Cefdinir--this is an antibiotic we are using to treat to ensure that you do not have an infection 2. Metronidazole--this is another antibiotic we are using to treat to ensure that you do not have an infection 3. Protonix-- this is a medication to help treat your ulcer 4. Tramadol-- this is a medication to help with moderate pain levels 5. Miralax-- this is a bowel regimen to ensure you are having regular bowel movements while on your medications 6. Gabapentin--this is to help with your leg tingling, we are only prescribing you a limited amount, follow-up with your PCP to determine need for continuing this medication.    You have the following appointments scheduled: 1.  [facility] on 04/11/2020 at 4:10 PM, please arrive 15 minutes early. You will be getting lab work/blood drawn at this appointment.  2. Appointment with Dr. [name], general surgery on 04/20/2020 at 11:15AM, please arrive 15 minutes early.

## 2020-04-10 NOTE — Progress Notes (Signed)
[facility] Progress Note     Subjective: Patient tolerating FLD. Having bowel function. No other complaints.  Objective: Vital signs in last 24 hours: Temp:  [97.6 F (36.4 C)-98.2 F (36.8 C)] 98.1 F (36.7 C) (09/27 0726) Pulse Rate:  [87-90] 87 (09/27 0726) Resp:  [14] 14 (09/27 0726) BP: (93-100)/(52-58) 93/52 (09/27 0726) Last BM Date: 04/09/20  Intake/Output from previous day: No intake/output data recorded. Intake/Output this shift: No intake/output data recorded.  PE: General: pleasant, WD, obese female who is laying in bed in NAD Lungs: Respiratory effort nonlabored Abd: soft, NT, ND, +BS, G-tube site scabbed over with no drainage, midline wound with healthy granulation tissue     Lab Results:  Recent Labs    04/09/20 1843 04/10/20 0223  WBC 7.1 7.7  HGB 8.0* 7.8*  HCT 27.6* 26.2*  PLT 306 352   BMET Recent Labs    04/09/20 1843 04/10/20 0223  NA 136 136  K 3.7 3.9  CL 109 109  CO2 20* 21*  GLUCOSE 108* 127*  BUN <5* <5*  CREATININE 0.63 0.53  CALCIUM 9.8 9.6   PT/INR Recent Labs    04/07/20 1600  LABPROT 18.7*  INR 1.6*   CMP     Component Value Date/Time   NA 136 04/10/2020 0223   NA 136 03/07/2020 1502   K 3.9 04/10/2020 0223   CL 109 04/10/2020 0223   CO2 21 (L) 04/10/2020 0223   GLUCOSE 127 (H) 04/10/2020 0223   BUN <5 (L) 04/10/2020 0223   BUN 3 (L) 03/07/2020 1502   CREATININE 0.53 04/10/2020 0223   CALCIUM 9.6 04/10/2020 0223   PROT 6.1 (L) 04/10/2020 0223   ALBUMIN 1.7 (L) 04/10/2020 0223   AST 15 04/10/2020 0223   ALT 7 04/10/2020 0223   ALKPHOS 217 (H) 04/10/2020 0223   BILITOT 0.6 04/10/2020 0223   GFRNONAA >60 04/10/2020 0223   GFRAA >60 04/10/2020 0223   Lipase     Component Value Date/Time   LIPASE 154 (H) 04/07/2020 1600       Studies/Results: DG UGI W SINGLE CM (SOL OR THIN BA)  Result Date: 04/08/2020 CLINICAL DATA:  Patient with recent revised gastric bypass procedure. Evaluate for  leak. EXAM: WATER SOLUBLE UPPER GI SERIES TECHNIQUE: Single-column upper GI series was performed using water soluble contrast. CONTRAST:  Omnipaque oral contrast COMPARISON:  CT abdomen pelvis April 07, 2020 FLUOROSCOPY TIME:  Fluoroscopy Time:  4 minutes Radiation Exposure Index (if provided by the fluoroscopic device): 129.9 Number of Acquired Spot Images: 4 FINDINGS: The oral contrast material flowed freely throughout the distal esophagus into the gastric pouch and jejunum. No evidence for extravasated contrast to suggest leak. The contrast flowed freely out of the gastric pouch into the proximal small bowel. IMPRESSION: No evidence to suggest leak from the revised gastric bypass. Electronically Signed   By: Lovey Newcomer M.D.   On: 04/08/2020 11:11    Anti-infectives: Anti-infectives (From admission, onward)   Start     Dose/Rate Route Frequency Ordered Stop   04/09/20 1400  metroNIDAZOLE (FLAGYL) 50 mg/ml oral suspension 500 mg        500 mg Oral Every 8 hours 04/09/20 1115     04/09/20 1400  cefdinir (OMNICEF) 250 MG/5ML suspension 300 mg        300 mg Oral 2 times daily 04/09/20 1115     04/08/20 0330  vancomycin (VANCOREADY) IVPB 1500 mg/300 mL  Status:  Discontinued  1,500 mg 150 mL/hr over 120 Minutes Intravenous Every 12 hours 04/07/20 1707 04/09/20 1057   04/07/20 2300  ceFEPIme (MAXIPIME) 2 g in sodium chloride 0.9 % 100 mL IVPB  Status:  Discontinued        2 g 200 mL/hr over 30 Minutes Intravenous Every 8 hours 04/07/20 1707 04/09/20 1115   04/07/20 2230  metroNIDAZOLE (FLAGYL) IVPB 500 mg  Status:  Discontinued        500 mg 100 mL/hr over 60 Minutes Intravenous Every 8 hours 04/07/20 2219 04/09/20 1115   04/07/20 1445  vancomycin (VANCOCIN) 2,500 mg in sodium chloride 0.9 % 500 mL IVPB        2,500 mg 250 mL/hr over 120 Minutes Intravenous  Once 04/07/20 1436 04/07/20 1719   04/07/20 1430  ceFEPIme (MAXIPIME) 2 g in sodium chloride 0.9 % 100 mL IVPB        2 g 200  mL/hr over 30 Minutes Intravenous  Once 04/07/20 1424 04/07/20 1605   04/07/20 1430  metroNIDAZOLE (FLAGYL) IVPB 500 mg        500 mg 100 mL/hr over 60 Minutes Intravenous  Once 04/07/20 1424 04/07/20 1605   04/07/20 1430  vancomycin (VANCOCIN) IVPB 1000 mg/200 mL premix  Status:  Discontinued        1,000 mg 200 mL/hr over 60 Minutes Intravenous  Once 04/07/20 1424 04/07/20 1556       Assessment/Plan S/p gastric bypass 2002, s/p recent exploratory laparotomy/ SBR and abscess drainage in August 2021 by Dr. Greer Pickerel - UGI without evidence of leak - advance to bari advanced diet - continue PO abx on discharge to total 10 days abx - continue protonix PO daily on discharge - continue daily dressing changes - ok to shower at home - have put follow up with Dr. [name] in [facility] - clear for discharge from a surgical standpoint  FEN: bari advanced VTE: lovenox ID: PO cefdinir and flagyl  LOS: 3 days    Norm Parcel , [facility] 04/10/2020, 9:22 AM Please see Amion for pager number during day hours 7:00am-4:30pm

## 2020-04-11 ENCOUNTER — Encounter: Payer: Self-pay | Admitting: Family Medicine

## 2020-04-11 ENCOUNTER — Other Ambulatory Visit: Payer: Self-pay

## 2020-04-11 ENCOUNTER — Telehealth: Payer: Self-pay

## 2020-04-11 ENCOUNTER — Ambulatory Visit (INDEPENDENT_AMBULATORY_CARE_PROVIDER_SITE_OTHER): Payer: BC Managed Care – PPO | Admitting: Family Medicine

## 2020-04-11 ENCOUNTER — Other Ambulatory Visit: Payer: Self-pay | Admitting: Family Medicine

## 2020-04-11 VITALS — BP 125/80 | HR 122 | Ht 70.0 in | Wt 253.2 lb

## 2020-04-11 DIAGNOSIS — K651 Peritoneal abscess: Secondary | ICD-10-CM | POA: Diagnosis not present

## 2020-04-11 DIAGNOSIS — D510 Vitamin B12 deficiency anemia due to intrinsic factor deficiency: Secondary | ICD-10-CM

## 2020-04-11 DIAGNOSIS — G479 Sleep disorder, unspecified: Secondary | ICD-10-CM | POA: Diagnosis not present

## 2020-04-11 DIAGNOSIS — G629 Polyneuropathy, unspecified: Secondary | ICD-10-CM

## 2020-04-11 DIAGNOSIS — R0609 Other forms of dyspnea: Secondary | ICD-10-CM

## 2020-04-11 DIAGNOSIS — R0600 Dyspnea, unspecified: Secondary | ICD-10-CM

## 2020-04-11 LAB — POCT HEMOGLOBIN: Hemoglobin: 8.7 g/dL — AB (ref 11–14.6)

## 2020-04-11 MED ORDER — TRAZODONE HCL 50 MG PO TABS: 25.0000 mg | ORAL_TABLET | Freq: Every evening | ORAL | 3 refills | Status: AC | PRN

## 2020-04-11 MED ORDER — METRONIDAZOLE 500 MG PO TABS: 500.0000 mg | ORAL_TABLET | Freq: Three times a day (TID) | ORAL | 0 refills | Status: AC

## 2020-04-11 NOTE — Progress Notes (Signed)
    SUBJECTIVE:   CHIEF COMPLAINT / HPI:   Hospital follow up:  Patient still having significant gas.  She has greenish discharge from her JP drain site.  This was present before hospitalization but not during hospitalization.  She will have swelling on the site around the JP drain that causes pain and hardens, then drains with relief of pain.    Difficulty sleeping: pain keeps the pt up at night.  She gets sleep from 12-3  And then 7-10am usually.  Has difficulty falling asleep. She has tried white noise, relxaing muisic, keeping the TV off, but none of it works.    Leg paresthesia: has been taking 100mg  gabapentin TID since her hospitalization.  This has improved her 'tingling' partially.  She would like to continue taking the medication.   SOB - patient having dyspnea with exertion.  She becomes more out of breath when doing things such as walking to the bathroom.  Her sister has a history of COPD but never smoked.  Pt quit smoking cigarettes last July.    PERTINENT  PMH / PSH:   OBJECTIVE:   BP 125/80   Pulse (!) 122   Ht 5\' 10"  (1.778 m)   Wt 253 lb 3.2 oz (114.9 kg)   LMP 02/09/2020   SpO2 96%   BMI 36.33 kg/m   Gen: alert. Oriented.  Mild discomfort CV: RRR. No murmur Pulm: LCTAB Abd: no erythema or purulence at previous drain sites.  JP drain site not fully closed. Some light green discharge seen on gauze.  TTP near site of drain   ASSESSMENT/PLAN:   Intra-abdominal abscess (HCC) Pt improved but still having abdominal pain and drainage at the site of previous JP drain.  No signs of infection and pt on abx.  Drainage the result of poor healing due to protein malnutrition (albumin 1.7). advised pt to continue abx and increase protein-calorie intake with diet and nutrition shakes.   Difficulty sleeping Difficulty sleeping due to pain.  Difficulty falling asleep and with early awakening.  Has tried behavioral interventions.  Will try trazadone to help her while she heals  from abdominal surgeries.    Dyspnea Likely due to deconditioning, anemia. Will get echo to evaluate for cardiac abnormalities.    Neuropathy Developed paresthesia of legs in the hospital.  Started on gabapentin in the hospital with improvement s/p treatment.  Would like to continue gabapentin. Advised pt to continue gabapentin and follow up with PCP.      Benay Pike, MD [place]

## 2020-04-11 NOTE — Telephone Encounter (Signed)
Patient calls nurse line regarding medication management for Flagyl. Patient's insurance does not cover flagyl in liquid form, and is requesting that flagyl be sent in tablet form.   To PCP   Please advise  Talbot Grumbling, RN

## 2020-04-11 NOTE — Telephone Encounter (Signed)
Flagyl sent to pharmacy. [name], MD

## 2020-04-11 NOTE — Patient Instructions (Addendum)
I would like you to try to get as much nutrition as possible.  Protein will help your wounds heal.  Try drinking 2 to 3 nutritional shakes every day in addition to what you are also eating.    Someone will call you when we have scheduled your echo.    I will prescribe you trazadone to help you sleep at night. Take a half a tablet at night before bed.    Please schedule an appointment with your PCP within the next 3-4 weeks.    Have a great day,   Clemetine Marker, MD

## 2020-04-12 DIAGNOSIS — Z6837 Body mass index (BMI) 37.0-37.9, adult: Secondary | ICD-10-CM | POA: Diagnosis not present

## 2020-04-12 DIAGNOSIS — D519 Vitamin B12 deficiency anemia, unspecified: Secondary | ICD-10-CM | POA: Diagnosis not present

## 2020-04-12 DIAGNOSIS — T8143XA Infection following a procedure, organ and space surgical site, initial encounter: Secondary | ICD-10-CM | POA: Diagnosis not present

## 2020-04-12 DIAGNOSIS — A419 Sepsis, unspecified organism: Secondary | ICD-10-CM | POA: Diagnosis not present

## 2020-04-12 DIAGNOSIS — K863 Pseudocyst of pancreas: Secondary | ICD-10-CM | POA: Diagnosis not present

## 2020-04-12 DIAGNOSIS — Z9181 History of falling: Secondary | ICD-10-CM | POA: Diagnosis not present

## 2020-04-12 DIAGNOSIS — T8144XA Sepsis following a procedure, initial encounter: Secondary | ICD-10-CM | POA: Diagnosis not present

## 2020-04-12 DIAGNOSIS — K219 Gastro-esophageal reflux disease without esophagitis: Secondary | ICD-10-CM | POA: Diagnosis not present

## 2020-04-12 DIAGNOSIS — K91870 Postprocedural hematoma of a digestive system organ or structure following a digestive system procedure: Secondary | ICD-10-CM | POA: Diagnosis not present

## 2020-04-12 DIAGNOSIS — Z87440 Personal history of urinary (tract) infections: Secondary | ICD-10-CM | POA: Diagnosis not present

## 2020-04-12 DIAGNOSIS — F17210 Nicotine dependence, cigarettes, uncomplicated: Secondary | ICD-10-CM | POA: Diagnosis not present

## 2020-04-12 LAB — CULTURE, BLOOD (ROUTINE X 2)
Culture: NO GROWTH
Culture: NO GROWTH

## 2020-04-15 DIAGNOSIS — Z87440 Personal history of urinary (tract) infections: Secondary | ICD-10-CM | POA: Diagnosis not present

## 2020-04-15 DIAGNOSIS — Z6837 Body mass index (BMI) 37.0-37.9, adult: Secondary | ICD-10-CM | POA: Diagnosis not present

## 2020-04-15 DIAGNOSIS — A419 Sepsis, unspecified organism: Secondary | ICD-10-CM | POA: Diagnosis not present

## 2020-04-15 DIAGNOSIS — T8144XA Sepsis following a procedure, initial encounter: Secondary | ICD-10-CM | POA: Diagnosis not present

## 2020-04-15 DIAGNOSIS — K863 Pseudocyst of pancreas: Secondary | ICD-10-CM | POA: Diagnosis not present

## 2020-04-15 DIAGNOSIS — Z9181 History of falling: Secondary | ICD-10-CM | POA: Diagnosis not present

## 2020-04-15 DIAGNOSIS — K219 Gastro-esophageal reflux disease without esophagitis: Secondary | ICD-10-CM | POA: Diagnosis not present

## 2020-04-15 DIAGNOSIS — F17210 Nicotine dependence, cigarettes, uncomplicated: Secondary | ICD-10-CM | POA: Diagnosis not present

## 2020-04-15 DIAGNOSIS — T8143XA Infection following a procedure, organ and space surgical site, initial encounter: Secondary | ICD-10-CM | POA: Diagnosis not present

## 2020-04-15 DIAGNOSIS — K91870 Postprocedural hematoma of a digestive system organ or structure following a digestive system procedure: Secondary | ICD-10-CM | POA: Diagnosis not present

## 2020-04-15 DIAGNOSIS — D519 Vitamin B12 deficiency anemia, unspecified: Secondary | ICD-10-CM | POA: Diagnosis not present

## 2020-04-17 DIAGNOSIS — G479 Sleep disorder, unspecified: Secondary | ICD-10-CM | POA: Insufficient documentation

## 2020-04-17 DIAGNOSIS — G629 Polyneuropathy, unspecified: Secondary | ICD-10-CM | POA: Insufficient documentation

## 2020-04-17 NOTE — Assessment & Plan Note (Signed)
Difficulty sleeping due to pain.  Difficulty falling asleep and with early awakening.  Has tried behavioral interventions.  Will try trazadone to help her while she heals from abdominal surgeries.

## 2020-04-17 NOTE — Assessment & Plan Note (Signed)
Likely due to deconditioning, anemia. Will get echo to evaluate for cardiac abnormalities.

## 2020-04-17 NOTE — Assessment & Plan Note (Signed)
Pt improved but still having abdominal pain and drainage at the site of previous JP drain.  No signs of infection and pt on abx.  Drainage the result of poor healing due to protein malnutrition (albumin 1.7). advised pt to continue abx and increase protein-calorie intake with diet and nutrition shakes.

## 2020-04-17 NOTE — Assessment & Plan Note (Signed)
Developed paresthesia of legs in the hospital.  Started on gabapentin in the hospital with improvement s/p treatment.  Would like to continue gabapentin. Advised pt to continue gabapentin and follow up with PCP.

## 2020-04-18 ENCOUNTER — Telehealth: Payer: Self-pay

## 2020-04-18 DIAGNOSIS — D519 Vitamin B12 deficiency anemia, unspecified: Secondary | ICD-10-CM | POA: Diagnosis not present

## 2020-04-18 DIAGNOSIS — F17210 Nicotine dependence, cigarettes, uncomplicated: Secondary | ICD-10-CM | POA: Diagnosis not present

## 2020-04-18 DIAGNOSIS — Z9181 History of falling: Secondary | ICD-10-CM | POA: Diagnosis not present

## 2020-04-18 DIAGNOSIS — Z6837 Body mass index (BMI) 37.0-37.9, adult: Secondary | ICD-10-CM | POA: Diagnosis not present

## 2020-04-18 DIAGNOSIS — A419 Sepsis, unspecified organism: Secondary | ICD-10-CM | POA: Diagnosis not present

## 2020-04-18 DIAGNOSIS — T8143XA Infection following a procedure, organ and space surgical site, initial encounter: Secondary | ICD-10-CM | POA: Diagnosis not present

## 2020-04-18 DIAGNOSIS — T8144XA Sepsis following a procedure, initial encounter: Secondary | ICD-10-CM | POA: Diagnosis not present

## 2020-04-18 DIAGNOSIS — K219 Gastro-esophageal reflux disease without esophagitis: Secondary | ICD-10-CM | POA: Diagnosis not present

## 2020-04-18 DIAGNOSIS — K863 Pseudocyst of pancreas: Secondary | ICD-10-CM | POA: Diagnosis not present

## 2020-04-18 DIAGNOSIS — Z87440 Personal history of urinary (tract) infections: Secondary | ICD-10-CM | POA: Diagnosis not present

## 2020-04-18 DIAGNOSIS — K91870 Postprocedural hematoma of a digestive system organ or structure following a digestive system procedure: Secondary | ICD-10-CM | POA: Diagnosis not present

## 2020-04-18 NOTE — Telephone Encounter (Signed)
Spoke with pharmacy rep. At [facility] informed them that METRONIDAZOLE 50mg   Is not prescribed at this practice. The rep stated that she will try and changed and see who else this medication needs to be sent to. Salvatore Marvel, CMA

## 2020-04-21 DIAGNOSIS — K91870 Postprocedural hematoma of a digestive system organ or structure following a digestive system procedure: Secondary | ICD-10-CM | POA: Diagnosis not present

## 2020-04-21 DIAGNOSIS — K219 Gastro-esophageal reflux disease without esophagitis: Secondary | ICD-10-CM | POA: Diagnosis not present

## 2020-04-21 DIAGNOSIS — T8144XA Sepsis following a procedure, initial encounter: Secondary | ICD-10-CM | POA: Diagnosis not present

## 2020-04-21 DIAGNOSIS — K863 Pseudocyst of pancreas: Secondary | ICD-10-CM | POA: Diagnosis not present

## 2020-04-21 DIAGNOSIS — F17210 Nicotine dependence, cigarettes, uncomplicated: Secondary | ICD-10-CM | POA: Diagnosis not present

## 2020-04-21 DIAGNOSIS — T8143XA Infection following a procedure, organ and space surgical site, initial encounter: Secondary | ICD-10-CM | POA: Diagnosis not present

## 2020-04-21 DIAGNOSIS — Z6837 Body mass index (BMI) 37.0-37.9, adult: Secondary | ICD-10-CM | POA: Diagnosis not present

## 2020-04-21 DIAGNOSIS — Z87440 Personal history of urinary (tract) infections: Secondary | ICD-10-CM | POA: Diagnosis not present

## 2020-04-21 DIAGNOSIS — D519 Vitamin B12 deficiency anemia, unspecified: Secondary | ICD-10-CM | POA: Diagnosis not present

## 2020-04-21 DIAGNOSIS — A419 Sepsis, unspecified organism: Secondary | ICD-10-CM | POA: Diagnosis not present

## 2020-04-21 DIAGNOSIS — Z9181 History of falling: Secondary | ICD-10-CM | POA: Diagnosis not present

## 2020-04-25 ENCOUNTER — Telehealth: Payer: Self-pay | Admitting: Hematology and Oncology

## 2020-04-25 DIAGNOSIS — D519 Vitamin B12 deficiency anemia, unspecified: Secondary | ICD-10-CM | POA: Diagnosis not present

## 2020-04-25 DIAGNOSIS — K863 Pseudocyst of pancreas: Secondary | ICD-10-CM | POA: Diagnosis not present

## 2020-04-25 DIAGNOSIS — T8144XA Sepsis following a procedure, initial encounter: Secondary | ICD-10-CM | POA: Diagnosis not present

## 2020-04-25 DIAGNOSIS — Z87440 Personal history of urinary (tract) infections: Secondary | ICD-10-CM | POA: Diagnosis not present

## 2020-04-25 DIAGNOSIS — K219 Gastro-esophageal reflux disease without esophagitis: Secondary | ICD-10-CM | POA: Diagnosis not present

## 2020-04-25 DIAGNOSIS — F17210 Nicotine dependence, cigarettes, uncomplicated: Secondary | ICD-10-CM | POA: Diagnosis not present

## 2020-04-25 DIAGNOSIS — Z9181 History of falling: Secondary | ICD-10-CM | POA: Diagnosis not present

## 2020-04-25 DIAGNOSIS — A419 Sepsis, unspecified organism: Secondary | ICD-10-CM | POA: Diagnosis not present

## 2020-04-25 DIAGNOSIS — Z6837 Body mass index (BMI) 37.0-37.9, adult: Secondary | ICD-10-CM | POA: Diagnosis not present

## 2020-04-25 DIAGNOSIS — K91870 Postprocedural hematoma of a digestive system organ or structure following a digestive system procedure: Secondary | ICD-10-CM | POA: Diagnosis not present

## 2020-04-25 DIAGNOSIS — T8143XA Infection following a procedure, organ and space surgical site, initial encounter: Secondary | ICD-10-CM | POA: Diagnosis not present

## 2020-04-25 NOTE — Telephone Encounter (Signed)
Received a new hem referral from Dr. [name] for IDA. Pt returned my call and has been scheduled to see Dr. Alvy Bimler on 10/26 at 9am. Pt aware to arrive 30 minutes early.

## 2020-04-28 DIAGNOSIS — D519 Vitamin B12 deficiency anemia, unspecified: Secondary | ICD-10-CM | POA: Diagnosis not present

## 2020-04-28 DIAGNOSIS — A419 Sepsis, unspecified organism: Secondary | ICD-10-CM | POA: Diagnosis not present

## 2020-04-28 DIAGNOSIS — T8144XA Sepsis following a procedure, initial encounter: Secondary | ICD-10-CM | POA: Diagnosis not present

## 2020-04-28 DIAGNOSIS — F17210 Nicotine dependence, cigarettes, uncomplicated: Secondary | ICD-10-CM | POA: Diagnosis not present

## 2020-04-28 DIAGNOSIS — Z6837 Body mass index (BMI) 37.0-37.9, adult: Secondary | ICD-10-CM | POA: Diagnosis not present

## 2020-04-28 DIAGNOSIS — T8143XA Infection following a procedure, organ and space surgical site, initial encounter: Secondary | ICD-10-CM | POA: Diagnosis not present

## 2020-04-28 DIAGNOSIS — K863 Pseudocyst of pancreas: Secondary | ICD-10-CM | POA: Diagnosis not present

## 2020-04-28 DIAGNOSIS — K91870 Postprocedural hematoma of a digestive system organ or structure following a digestive system procedure: Secondary | ICD-10-CM | POA: Diagnosis not present

## 2020-04-28 DIAGNOSIS — Z87440 Personal history of urinary (tract) infections: Secondary | ICD-10-CM | POA: Diagnosis not present

## 2020-04-28 DIAGNOSIS — Z9181 History of falling: Secondary | ICD-10-CM | POA: Diagnosis not present

## 2020-04-28 DIAGNOSIS — K219 Gastro-esophageal reflux disease without esophagitis: Secondary | ICD-10-CM | POA: Diagnosis not present

## 2020-05-02 ENCOUNTER — Ambulatory Visit: Payer: BC Managed Care – PPO | Admitting: Family Medicine

## 2020-05-02 ENCOUNTER — Other Ambulatory Visit: Payer: Self-pay

## 2020-05-02 ENCOUNTER — Ambulatory Visit (HOSPITAL_COMMUNITY): Payer: BC Managed Care – PPO | Attending: Family Medicine

## 2020-05-02 DIAGNOSIS — R0600 Dyspnea, unspecified: Secondary | ICD-10-CM

## 2020-05-02 DIAGNOSIS — R0609 Other forms of dyspnea: Secondary | ICD-10-CM

## 2020-05-02 LAB — ECHOCARDIOGRAM COMPLETE
Area-P 1/2: 3.17 cm2
S' Lateral: 2.9 cm

## 2020-05-04 NOTE — Progress Notes (Signed)
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  At her highest weight, she weighed around 350 pounds  She also have history of pancreatitis with pseudocyst formation She was in the hospital early this year with signs and symptoms of bowel obstruction She was found to be anemic On February 10, 2020, she underwent enteroscopy which revealed - LA Grade B reflux esophagitis with no bleeding. - Roux-en-Y gastrojejunostomy with gastrojejunal anastomosis characterized by ulceration. Biopsied.  - Single surgical staple noted and removed. - The examined portion of the jejunum was normal  On February 14, 2020, she underwent surgery POSTOPERATIVE DIAGNOSES: 1.  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 2.  Left abdominal/retroperitoneal hematoma/abscess with possible perforation. 3.  History of open retrocolic Roux-en-Y gastric bypass. 4.  Serosal tear of Roux limb  PROCEDURE: 1.  Exploratory laparotomy. 2.  Lysis of adhesions for more than 2 hours. 3.  Drainage of intra-abdominal/retroperitoneal LUQ hematoma/abscess. 4.  Partial resection of Roux limb. 5.  Insertion of 18-French gastrotomy tube in the gastric remnant.  She was discharged with wound VAC and then admitted to the hospital in September for sepsis/bowel leak.  She was managed with broad-spectrum IV antibiotics. She received blood transfusion in August.  She was found to have abnormal CBC from recent follow-up blood test. She denies recent chest pain on exertion, shortness of breath on  minimal exertion, pre-syncopal episodes, or palpitations.  She does complain of excessive fatigue She had not noticed any recent bleeding such as epistaxis, hematuria or hematochezia The patient denies over the counter NSAID ingestion. She is not on antiplatelets agents.  She had no prior history or diagnosis of cancer. Her age appropriate screening programs are up-to-date. She has pica  With excessive chewing of ice and eats a variety of diet. She never donated blood  She is not taking oral iron supplement because it made her sick in her stomach and she does not absorb them.  She was taking vitamin B12 injection up until several months ago.  She stopped because she cannot afford to pay for B12 injections She denies heavy menstruation. She complained of sensation of neuropathy.  She is prescribed gabapentin but is not taking it consistently  MEDICAL HISTORY:  Past Medical History:  Diagnosis Date  . Allergy   . Anemia     SURGICAL HISTORY: Past Surgical History:  Procedure Laterality Date  . APPLICATION OF WOUND VAC N/A 02/14/2020   Procedure: APPLICATION OF WOUND VAC;  Surgeon: Greer Pickerel, MD;  Location: [facility];  Service: General;  Laterality: N/A;  . BIOPSY  02/10/2020   Procedure: BIOPSY;  Surgeon: Lavena Bullion, DO;  Location: [facility];  Service: Gastroenterology;;  . [facility]  2013   crystalized gallbaldder  . DEBRIDEMENT OF ABDOMINAL WALL ABSCESS N/A 02/14/2020   Procedure: DRAINAGE OF INTRAABDOMINAL ABSCESS;  Surgeon: Greer Pickerel, MD;  Location: [facility];  Service: General;  Laterality: N/A;  . ENTEROSCOPY N/A 02/10/2020   Procedure: ENTEROSCOPY;  Surgeon: Lavena Bullion, DO;  Location: [facility];  Service: Gastroenterology;  Laterality: N/A;  . GASTRIC BYPASS  2002  .  GASTROSTOMY N/A 02/14/2020   Procedure: INSERTION OF GASTROSTOMY TUBE;  Surgeon: Greer Pickerel, MD;  Location: [facility];  Service: General;  Laterality: N/A;  . LAPAROSCOPIC [place] WITH  UPPER ENDOSCOPY N/A 02/14/2020   Procedure: PARTIAL RESECTION OF ROUX LIMB;  Surgeon: Greer Pickerel, MD;  Location: [facility];  Service: General;  Laterality: N/A;  . LAPAROTOMY N/A 02/14/2020   Procedure: EXPLORATORY LAPAROTOMY;  Surgeon: Greer Pickerel, MD;  Location: [facility];  Service: General;  Laterality: N/A;  . LYSIS OF ADHESION N/A 02/14/2020   Procedure: LYSIS OF ADHESION >2 HOURS;  Surgeon: Greer Pickerel, MD;  Location: [facility];  Service: General;  Laterality: N/A;    SOCIAL HISTORY: Social History   Socioeconomic History  . Marital status: Single    Spouse name: Not on file  . Number of children: Not on file  . Years of education: Not on file  . Highest education level: Not on file  Occupational History  . Not on file  Tobacco Use  . Smoking status: Current Every Day Smoker    Packs/day: 0.50    Years: 5.00    Pack years: 2.50    Types: Cigarettes  . Smokeless tobacco: Never Used  Vaping Use  . Vaping Use: Never used  Substance and Sexual Activity  . Alcohol use: Yes    Alcohol/week: 13.0 standard drinks    Types: 5 Glasses of wine, 4 Cans of beer, 4 Shots of liquor per week  . Drug use: Not on file  . Sexual activity: Yes    Birth control/protection: None  Other Topics Concern  . Not on file  Social History Narrative  . Not on file   Social Determinants of Health   Financial Resource Strain:   . Difficulty of Paying Living Expenses: Not on file  Food Insecurity:   . Worried About Charity fundraiser in the Last Year: Not on file  . Ran Out of Food in the Last Year: Not on file  Transportation Needs:   . Lack of Transportation (Medical): Not on file  . Lack of Transportation (Non-Medical): Not on file  Physical Activity:   . Days of Exercise per Week: Not on file  . Minutes of Exercise per Session: Not on file  Stress:   . Feeling of Stress : Not on file  Social Connections:   . Frequency of Communication with Friends and Family: Not on file  . Frequency of Social  Gatherings with Friends and Family: Not on file  . Attends Religious Services: Not on file  . Active Member of Clubs or Organizations: Not on file  . Attends Archivist Meetings: Not on file  . Marital Status: Not on file  Intimate Partner Violence:   . Fear of Current or Ex-Partner: Not on file  . Emotionally Abused: Not on file  . Physically Abused: Not on file  . Sexually Abused: Not on file    FAMILY HISTORY: Family History  Problem Relation Age of Onset  . COPD Sister   . Heart disease Sister     ALLERGIES:  is allergic to nsaids, lactose intolerance (gi), and penicillins.  MEDICATIONS:  Current Outpatient Medications  Medication Sig Dispense Refill  . Multiple Vitamins-Minerals (MULTIVITAMIN WITH MINERALS) tablet Take 1 tablet by mouth daily. 30 tablet 2  . traZODone (DESYREL) 50 MG tablet Take 0.5 tablets (25 mg total) by mouth at bedtime as needed for sleep. 30 tablet 3   No current facility-administered medications for  this visit.    REVIEW OF SYSTEMS:   Constitutional: Denies fevers, chills or abnormal night sweats Eyes: Denies blurriness of vision, double vision or watery eyes Ears, nose, mouth, throat, and face: Denies mucositis or sore throat Respiratory: Denies cough, dyspnea or wheezes Cardiovascular: Denies palpitation, chest discomfort or lower extremity swelling Gastrointestinal:  Denies nausea, heartburn or change in bowel habits Skin: Denies abnormal skin rashes Behavioral/Psych: Mood is stable, no new changes  All other systems were reviewed with the patient and are negative.  PHYSICAL EXAMINATION: ECOG PERFORMANCE STATUS: 1 - Symptomatic but completely ambulatory  Vitals:   05/09/20 0856  BP: (!) 113/58  Pulse: 93  Resp: 18  Temp: 97.7 F (36.5 C)  SpO2: 100%   Filed Weights   05/09/20 0856  Weight: 245 lb 12.8 oz (111.5 kg)    GENERAL:alert, no distress and comfortable SKIN: skin color, texture, turgor are normal, no rashes  or significant lesions EYES: normal, conjunctiva are pink and non-injected, sclera clear OROPHARYNX:no exudate, no erythema and lips, buccal mucosa, and tongue normal  NECK: supple, thyroid normal size, non-tender, without nodularity LYMPH:  no palpable lymphadenopathy in the cervical, axillary or inguinal LUNGS: clear to auscultation and percussion with normal breathing effort HEART: regular rate & rhythm soft left systolic murmurs and no lower extremity edema ABDOMEN:abdomen soft, non-tender and normal bowel sounds.  Noted well-healed surgical scar Musculoskeletal:no cyanosis of digits and no clubbing  PSYCH: alert & oriented x 3 with fluent speech NEURO: no focal motor/sensory deficits  RADIOGRAPHIC STUDIES: I have personally reviewed the radiological images as listed and agreed with the findings in the report. ECHOCARDIOGRAM COMPLETE  Result Date: 05/02/2020    ECHOCARDIOGRAM REPORT   Patient Name:   Alexis Avila Date of Exam: 05/02/2020 Medical Rec #:  [id number]       Height:       70.0 in Accession #:    [id number]      Weight:       253.2 lb Date of Birth:  08-19-1980        BSA:          2.307 m Patient Age:    42 years        BP:           125/80 mmHg Patient Gender: F               HR:           79 bpm. Exam Location:  [address] Procedure: 2D Echo, 3D Echo, Cardiac Doppler, Color Doppler and Strain Analysis Indications:    R06.00 Dyspnea  History:        Patient has no prior history of Echocardiogram examinations.  Sonographer:    Cresenciano Lick RDCS Referring Phys: [place]  1. Left ventricular ejection fraction, by estimation, is 60 to 65%. The left ventricle has normal function. The left ventricle has no regional wall motion abnormalities. Left ventricular diastolic parameters are consistent with Grade I diastolic dysfunction (impaired relaxation). The average left ventricular global longitudinal strain is -23.9 %. The global longitudinal strain is  normal.  2. Right ventricular systolic function is normal. The right ventricular size is normal. There is normal pulmonary artery systolic pressure.  3. Left atrial size was mildly dilated.  4. The mitral valve is normal in structure. No evidence of mitral valve regurgitation. No evidence of mitral stenosis.  5. The aortic valve is tricuspid. Aortic valve regurgitation is not visualized. No aortic  stenosis is present.  6. Aortic dilatation noted. There is mild dilatation of the ascending aorta, measuring 37 mm.  7. The inferior vena cava is normal in size with greater than 50% respiratory variability, suggesting right atrial pressure of 3 mmHg. FINDINGS  Left Ventricle: Left ventricular ejection fraction, by estimation, is 60 to 65%. The left ventricle has normal function. The left ventricle has no regional wall motion abnormalities. The average left ventricular global longitudinal strain is -23.9 %. The global longitudinal strain is normal. The left ventricular internal cavity size was normal in size. There is no left ventricular hypertrophy. Left ventricular diastolic parameters are consistent with Grade I diastolic dysfunction (impaired relaxation). Indeterminate filling pressures. Right Ventricle: The right ventricular size is normal. No increase in right ventricular wall thickness. Right ventricular systolic function is normal. There is normal pulmonary artery systolic pressure. The tricuspid regurgitant velocity is 2.13 m/s, and  with an assumed right atrial pressure of 3 mmHg, the estimated right ventricular systolic pressure is 56.3 mmHg. Left Atrium: Left atrial size was mildly dilated. Right Atrium: Right atrial size was normal in size. Pericardium: There is no evidence of pericardial effusion. Mitral Valve: The mitral valve is normal in structure. No evidence of mitral valve regurgitation. No evidence of mitral valve stenosis. Tricuspid Valve: The tricuspid valve is normal in structure. Tricuspid valve  regurgitation is trivial. No evidence of tricuspid stenosis. Aortic Valve: The aortic valve is tricuspid. Aortic valve regurgitation is not visualized. No aortic stenosis is present. Pulmonic Valve: The pulmonic valve was normal in structure. Pulmonic valve regurgitation is not visualized. No evidence of pulmonic stenosis. Aorta: Aortic dilatation noted. There is mild dilatation of the ascending aorta, measuring 37 mm. Venous: The inferior vena cava is normal in size with greater than 50% respiratory variability, suggesting right atrial pressure of 3 mmHg. IAS/Shunts: No atrial level shunt detected by color flow Doppler.  LEFT VENTRICLE PLAX 2D LVIDd:         4.80 cm  Diastology LVIDs:         2.90 cm  LV e' medial:    6.74 cm/s LV PW:         0.80 cm  LV E/e' medial:  10.9 LV IVS:        0.80 cm  LV e' lateral:   13.10 cm/s LVOT diam:     2.30 cm  LV E/e' lateral: 5.6 LV SV:         86 LV SV Index:   37       2D Longitudinal Strain LVOT Area:     4.15 cm 2D Strain GLS (A2C):   -23.5 %                         2D Strain GLS (A3C):   -24.6 %                         2D Strain GLS (A4C):   -23.8 %                         2D Strain GLS Avg:     -23.9 %                          3D Volume EF:  3D EF:        64 %                         LV EDV:       163 ml                         LV ESV:       59 ml                         LV SV:        104 ml RIGHT VENTRICLE             IVC RV Basal diam:  4.10 cm     IVC diam: 1.70 cm RV S prime:     15.50 cm/s TAPSE (M-mode): 2.1 cm LEFT ATRIUM             Index       RIGHT ATRIUM           Index LA diam:        4.30 cm 1.86 cm/m  RA Area:     15.80 cm LA Vol (A2C):   74.1 ml 32.11 ml/m RA Volume:   45.80 ml  19.85 ml/m LA Vol (A4C):   61.3 ml 26.57 ml/m LA Biplane Vol: 69.6 ml 30.16 ml/m  AORTIC VALVE LVOT Vmax:   99.60 cm/s LVOT Vmean:  73.400 cm/s LVOT VTI:    0.206 m  AORTA Ao Root diam: 3.70 cm Ao Asc diam:  3.30 cm MITRAL VALVE               TRICUSPID  VALVE MV Area (PHT): 3.17 cm    TR Peak grad:   18.1 mmHg MV Decel Time: 239 msec    TR Vmax:        213.00 cm/s MV E velocity: 73.70 cm/s MV A velocity: 54.40 cm/s  SHUNTS MV E/A ratio:  1.35        Systemic VTI:  0.21 m                            Systemic Diam: 2.30 cm Skeet Latch MD Electronically signed by Skeet Latch MD Signature Date/Time: 05/02/2020/3:04:57 PM    Final     ASSESSMENT & PLAN:  Anemia, B12 deficiency The cause of her anemia is multifactorial, likely secondary to multiple mineral deficiencies from malabsorption I will order additional work-up today She is known to have vitamin B12 deficiency.  I will also start her on vitamin B12 in her next visit  Iron deficiency anemia She cannot tolerate oral iron She would likely need long-term IV iron infusion given her malabsorption from gastric bypass surgery I will schedule her first dose IV iron when I see her back in 2 weeks  Neuropathy She has sensation of peripheral neuropathy I suspect she could either have B12 deficiency or copper deficiency above She will continue gabapentin I will check the mineral levels and see her back in 2 weeks for further management  Vitamin D deficiency She is not taking vitamin D supplement I expect she would be profoundly vitamin D deficient given her gastric bypass surgery I will proceed to check her level and we will review test results in her next visit  Other fatigue With her excessive fatigue and irregular menstruation, I will proceed to check  her thyroid levels  Orders Placed This Encounter  Procedures  . CBC with Differential/Platelet    Standing Status:   Standing    Number of Occurrences:   22    Standing Expiration Date:   05/09/2021  . Ferritin    Standing Status:   Future    Number of Occurrences:   1    Standing Expiration Date:   05/09/2021  . Iron and TIBC    Standing Status:   Future    Number of Occurrences:   1    Standing Expiration Date:   05/09/2021   . Vitamin B12    Standing Status:   Future    Number of Occurrences:   1    Standing Expiration Date:   05/09/2021  . VITAMIN D 25 Hydroxy (Vit-D Deficiency, Fractures)    Standing Status:   Future    Number of Occurrences:   1    Standing Expiration Date:   05/09/2021  . TSH    Standing Status:   Future    Number of Occurrences:   1    Standing Expiration Date:   05/09/2021  . T4, free    Standing Status:   Future    Number of Occurrences:   1    Standing Expiration Date:   05/09/2021  . Folate RBC    Standing Status:   Future    Number of Occurrences:   1    Standing Expiration Date:   05/09/2021  . Copper, serum    Standing Status:   Future    Number of Occurrences:   1    Standing Expiration Date:   05/09/2021    All questions were answered. The patient knows to call the clinic with any problems, questions or concerns.  The total time spent in the appointment was 60 minutes encounter with patients including review of chart and various tests results, discussions about plan of care and coordination of care plan  Heath Lark, MD 10/26/202110:49 AM       Heath Lark, MD 05/09/20 10:49 AM

## 2020-05-09 ENCOUNTER — Inpatient Hospital Stay: Payer: BC Managed Care – PPO

## 2020-05-09 ENCOUNTER — Inpatient Hospital Stay: Payer: BC Managed Care – PPO | Attending: Hematology and Oncology | Admitting: Hematology and Oncology

## 2020-05-09 ENCOUNTER — Other Ambulatory Visit: Payer: Self-pay

## 2020-05-09 ENCOUNTER — Encounter: Payer: Self-pay | Admitting: Hematology and Oncology

## 2020-05-09 VITALS — BP 113/58 | HR 93 | Temp 97.7°F | Resp 18 | Ht 70.0 in | Wt 245.8 lb

## 2020-05-09 DIAGNOSIS — E559 Vitamin D deficiency, unspecified: Secondary | ICD-10-CM | POA: Diagnosis not present

## 2020-05-09 DIAGNOSIS — G629 Polyneuropathy, unspecified: Secondary | ICD-10-CM

## 2020-05-09 DIAGNOSIS — F17210 Nicotine dependence, cigarettes, uncomplicated: Secondary | ICD-10-CM | POA: Diagnosis not present

## 2020-05-09 DIAGNOSIS — Z9884 Bariatric surgery status: Secondary | ICD-10-CM

## 2020-05-09 DIAGNOSIS — K912 Postsurgical malabsorption, not elsewhere classified: Secondary | ICD-10-CM | POA: Insufficient documentation

## 2020-05-09 DIAGNOSIS — N921 Excessive and frequent menstruation with irregular cycle: Secondary | ICD-10-CM | POA: Diagnosis not present

## 2020-05-09 DIAGNOSIS — R5383 Other fatigue: Secondary | ICD-10-CM

## 2020-05-09 DIAGNOSIS — D509 Iron deficiency anemia, unspecified: Secondary | ICD-10-CM

## 2020-05-09 DIAGNOSIS — D510 Vitamin B12 deficiency anemia due to intrinsic factor deficiency: Secondary | ICD-10-CM

## 2020-05-09 DIAGNOSIS — Z8719 Personal history of other diseases of the digestive system: Secondary | ICD-10-CM | POA: Diagnosis not present

## 2020-05-09 DIAGNOSIS — E538 Deficiency of other specified B group vitamins: Secondary | ICD-10-CM | POA: Diagnosis not present

## 2020-05-09 LAB — CBC WITH DIFFERENTIAL/PLATELET
Abs Immature Granulocytes: 0.02 10*3/uL (ref 0.00–0.07)
Basophils Absolute: 0 10*3/uL (ref 0.0–0.1)
Basophils Relative: 1 %
Eosinophils Absolute: 0.1 10*3/uL (ref 0.0–0.5)
Eosinophils Relative: 1 %
HCT: 33.4 % — ABNORMAL LOW (ref 36.0–46.0)
Hemoglobin: 10.2 g/dL — ABNORMAL LOW (ref 12.0–15.0)
Immature Granulocytes: 0 %
Lymphocytes Relative: 28 %
Lymphs Abs: 2.2 10*3/uL (ref 0.7–4.0)
MCH: 26.6 pg (ref 26.0–34.0)
MCHC: 30.5 g/dL (ref 30.0–36.0)
MCV: 87.2 fL (ref 80.0–100.0)
Monocytes Absolute: 0.5 10*3/uL (ref 0.1–1.0)
Monocytes Relative: 6 %
Neutro Abs: 5.2 10*3/uL (ref 1.7–7.7)
Neutrophils Relative %: 64 %
Platelets: 318 10*3/uL (ref 150–400)
RBC: 3.83 MIL/uL — ABNORMAL LOW (ref 3.87–5.11)
RDW: 18.5 % — ABNORMAL HIGH (ref 11.5–15.5)
WBC: 8.1 10*3/uL (ref 4.0–10.5)
nRBC: 0 % (ref 0.0–0.2)

## 2020-05-09 LAB — IRON AND TIBC
Iron: 37 ug/dL — ABNORMAL LOW (ref 41–142)
Saturation Ratios: 13 % — ABNORMAL LOW (ref 21–57)
TIBC: 273 ug/dL (ref 236–444)
UIBC: 236 ug/dL (ref 120–384)

## 2020-05-09 LAB — T4, FREE: Free T4: 0.68 ng/dL (ref 0.61–1.12)

## 2020-05-09 LAB — TSH: TSH: 2.001 u[IU]/mL (ref 0.308–3.960)

## 2020-05-09 LAB — FERRITIN: Ferritin: 26 ng/mL (ref 11–307)

## 2020-05-09 LAB — VITAMIN D 25 HYDROXY (VIT D DEFICIENCY, FRACTURES): Vit D, 25-Hydroxy: 9.05 ng/mL — ABNORMAL LOW (ref 30–100)

## 2020-05-09 LAB — VITAMIN B12: Vitamin B-12: 134 pg/mL — ABNORMAL LOW (ref 180–914)

## 2020-05-09 NOTE — Assessment & Plan Note (Signed)
She has sensation of peripheral neuropathy I suspect she could either have B12 deficiency or copper deficiency above She will continue gabapentin I will check the mineral levels and see her back in 2 weeks for further management

## 2020-05-09 NOTE — Assessment & Plan Note (Signed)
She cannot tolerate oral iron She would likely need long-term IV iron infusion given her malabsorption from gastric bypass surgery I will schedule her first dose IV iron when I see her back in 2 weeks

## 2020-05-09 NOTE — Assessment & Plan Note (Signed)
With her excessive fatigue and irregular menstruation, I will proceed to check her thyroid levels

## 2020-05-09 NOTE — Assessment & Plan Note (Signed)
She is not taking vitamin D supplement I expect she would be profoundly vitamin D deficient given her gastric bypass surgery I will proceed to check her level and we will review test results in her next visit

## 2020-05-09 NOTE — Assessment & Plan Note (Signed)
The cause of her anemia is multifactorial, likely secondary to multiple mineral deficiencies from malabsorption I will order additional work-up today She is known to have vitamin B12 deficiency.  I will also start her on vitamin B12 in her next visit

## 2020-05-10 LAB — FOLATE RBC
Folate, Hemolysate: 227 ng/mL
Folate, RBC: 662 ng/mL (ref >498)
Hematocrit: 34.3 % (ref 34.0–46.6)

## 2020-05-13 LAB — COPPER, SERUM: Copper: 95 ug/dL (ref 80–158)

## 2020-05-16 ENCOUNTER — Other Ambulatory Visit: Payer: Self-pay | Admitting: Hematology and Oncology

## 2020-05-23 ENCOUNTER — Inpatient Hospital Stay (HOSPITAL_BASED_OUTPATIENT_CLINIC_OR_DEPARTMENT_OTHER): Payer: BC Managed Care – PPO | Admitting: Hematology and Oncology

## 2020-05-23 ENCOUNTER — Other Ambulatory Visit: Payer: Self-pay

## 2020-05-23 ENCOUNTER — Encounter: Payer: Self-pay | Admitting: Hematology and Oncology

## 2020-05-23 ENCOUNTER — Inpatient Hospital Stay: Payer: BC Managed Care – PPO | Attending: Hematology and Oncology

## 2020-05-23 ENCOUNTER — Other Ambulatory Visit: Payer: Self-pay | Admitting: Hematology and Oncology

## 2020-05-23 VITALS — BP 107/48 | HR 77 | Temp 98.6°F | Resp 16

## 2020-05-23 VITALS — BP 133/80 | HR 107 | Temp 97.2°F | Resp 20 | Ht 70.0 in | Wt 241.6 lb

## 2020-05-23 DIAGNOSIS — E559 Vitamin D deficiency, unspecified: Secondary | ICD-10-CM | POA: Diagnosis not present

## 2020-05-23 DIAGNOSIS — G629 Polyneuropathy, unspecified: Secondary | ICD-10-CM

## 2020-05-23 DIAGNOSIS — D509 Iron deficiency anemia, unspecified: Secondary | ICD-10-CM | POA: Diagnosis not present

## 2020-05-23 DIAGNOSIS — E538 Deficiency of other specified B group vitamins: Secondary | ICD-10-CM | POA: Insufficient documentation

## 2020-05-23 DIAGNOSIS — D510 Vitamin B12 deficiency anemia due to intrinsic factor deficiency: Secondary | ICD-10-CM | POA: Diagnosis not present

## 2020-05-23 MED ORDER — ERGOCALCIFEROL 1.25 MG (50000 UT) PO CAPS: 50000.0000 [IU] | ORAL_CAPSULE | ORAL | 0 refills | Status: AC

## 2020-05-23 MED ORDER — CYANOCOBALAMIN 1000 MCG/ML IJ SOLN
1000.0000 ug | Freq: Once | INTRAMUSCULAR | Status: AC
  Administered 2020-05-23: 1000 ug via INTRAMUSCULAR

## 2020-05-23 MED ORDER — CYANOCOBALAMIN 1000 MCG/ML IJ SOLN
INTRAMUSCULAR | Status: AC
  Filled 2020-05-23: qty 1

## 2020-05-23 MED ORDER — SODIUM CHLORIDE 0.9 % IV SOLN
Freq: Once | INTRAVENOUS | Status: AC
  Filled 2020-05-23: qty 250

## 2020-05-23 MED ORDER — SODIUM CHLORIDE 0.9 % IV SOLN
300.0000 mg | Freq: Once | INTRAVENOUS | Status: AC
  Administered 2020-05-23: 300 mg via INTRAVENOUS
  Filled 2020-05-23: qty 10

## 2020-05-23 NOTE — Assessment & Plan Note (Signed)
This is likely due to vitamin B12 deficiency I will prescribe high-dose vitamin B12 replacement therapy

## 2020-05-23 NOTE — Assessment & Plan Note (Signed)
The most likely cause of her anemia is due to chronic blood loss/malabsorption syndrome. °We discussed some of the risks, benefits, and alternatives of intravenous iron infusions. The patient is symptomatic from anemia and the iron level is critically low. She tolerated oral iron supplement poorly and desires to achieved higher levels of iron faster for adequate hematopoesis. Some of the side-effects to be expected including risks of infusion reactions, phlebitis, headaches, nausea and fatigue.  The patient is willing to proceed. Patient education material was dispensed.  °Goal is to keep ferritin level greater than 50 and resolution of anemia ° °

## 2020-05-23 NOTE — Patient Instructions (Signed)

## 2020-05-23 NOTE — Progress Notes (Signed)
[place] OFFICE PROGRESS NOTE  [name], MD  ASSESSMENT & PLAN:  Anemia, B12 deficiency She has profound vitamin B12 deficiency We will get her vitamin B12 injection weekly x 4 then monthly  Iron deficiency anemia The most likely cause of her anemia is due to chronic blood loss/malabsorption syndrome. We discussed some of the risks, benefits, and alternatives of intravenous iron infusions. The patient is symptomatic from anemia and the iron level is critically low. She tolerated oral iron supplement poorly and desires to achieved higher levels of iron faster for adequate hematopoesis. Some of the side-effects to be expected including risks of infusion reactions, phlebitis, headaches, nausea and fatigue.  The patient is willing to proceed. Patient education material was dispensed.  Goal is to keep ferritin level greater than 50 and resolution of anemia   Vitamin D deficiency I will prescribe high-dose vitamin D replacement therapy  Neuropathy This is likely due to vitamin B12 deficiency I will prescribe high-dose vitamin B12 replacement therapy   Orders Placed This Encounter  Procedures  . Iron and TIBC    Standing Status:   Standing    Number of Occurrences:   3    Standing Expiration Date:   05/23/2021  . Vitamin B12    Standing Status:   Standing    Number of Occurrences:   3    Standing Expiration Date:   05/23/2021  . Ferritin    Standing Status:   Standing    Number of Occurrences:   3    Standing Expiration Date:   05/23/2021  . VITAMIN D 25 Hydroxy (Vit-D Deficiency, Fractures)    Standing Status:   Standing    Number of Occurrences:   3    Standing Expiration Date:   05/23/2021    The total time spent in the appointment was 20 minutes encounter with patients including review of chart and various tests results, discussions about plan of care and coordination of care plan   All questions were answered. The patient knows to call the clinic with any  problems, questions or concerns. No barriers to learning was detected.    Heath Lark, MD 11/9/20211:29 PM  INTERVAL HISTORY: Alexis Avila 39 y.o. 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  At her highest weight, she weighed around 350 pounds  She also have history of pancreatitis with pseudocyst formation She was in the hospital early this year with signs and symptoms of bowel obstruction She was found to be anemic On February 10, 2020, she underwent enteroscopy which revealed - LA Grade B reflux esophagitis with no bleeding. - Roux-en-Y gastrojejunostomy with gastrojejunal anastomosis characterized by ulceration. Biopsied.  - Single surgical staple noted and removed. - The examined portion of the jejunum was normal  On February 14, 2020, she underwent surgery POSTOPERATIVE DIAGNOSES: 1.  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 2.  Left abdominal/retroperitoneal hematoma/abscess with possible perforation. 3.  History of open retrocolic Roux-en-Y gastric bypass. 4.  Serosal tear of Roux limb  PROCEDURE: 1.  Exploratory laparotomy. 2.  Lysis of adhesions for more than 2 hours. 3.  Drainage of intra-abdominal/retroperitoneal LUQ hematoma/abscess. 4.  Partial resection of Roux limb. 5.  Insertion of 18-French gastrotomy tube in the gastric remnant.  She was discharged with wound VAC and then  admitted to the hospital in September for sepsis/bowel leak.  She was managed with broad-spectrum IV antibiotics. She received blood transfusion in August.  She was found to have abnormal CBC from recent follow-up blood test. She denies recent chest pain on exertion, shortness of breath on minimal exertion, pre-syncopal episodes, or palpitations.  She does complain of excessive fatigue She had not noticed any recent bleeding such as epistaxis, hematuria or hematochezia The patient denies over the counter NSAID ingestion. She is not on antiplatelets agents.  She had no prior history or diagnosis of cancer. Her age appropriate screening programs are up-to-date. She has pica  With excessive chewing of ice and eats a variety of diet. She never donated blood  She is not taking oral iron supplement because it made her sick in her stomach and she does not absorb them.  She was taking vitamin B12 injection up until several months ago.  She stopped because she cannot afford to pay for B12 injections She denies heavy menstruation. She complained of sensation of neuropathy.  She is prescribed gabapentin but is not taking it consistently   I have reviewed the past medical history, past surgical history, social history and family history with the patient and they are unchanged from previous note.  ALLERGIES:  is allergic to nsaids, lactose intolerance (gi), and penicillins.  MEDICATIONS:  Current Outpatient Medications  Medication Sig Dispense Refill  . ergocalciferol (VITAMIN D2) 1.25 MG (50000 UT) capsule Take 1 capsule (50,000 Units total) by mouth once a week. 12 capsule 0  . Multiple Vitamins-Minerals (MULTIVITAMIN WITH MINERALS) tablet Take 1 tablet by mouth daily. 30 tablet 2  . traZODone (DESYREL) 50 MG tablet Take 0.5 tablets (25 mg total) by mouth at bedtime as needed for sleep. 30 tablet 3   No current facility-administered medications for this visit.     REVIEW OF SYSTEMS:    Constitutional: Denies fevers, chills or night sweats Eyes: Denies blurriness of vision Ears, nose, mouth, throat, and face: Denies mucositis or sore throat Respiratory: Denies cough, dyspnea or wheezes Cardiovascular: Denies palpitation, chest discomfort or lower extremity swelling Gastrointestinal:  Denies nausea, heartburn or change in bowel habits Skin: Denies abnormal skin rashes Lymphatics: Denies new lymphadenopathy or easy bruising Behavioral/Psych: Mood is stable, no new changes  All other systems were reviewed with the patient and are negative.  PHYSICAL EXAMINATION: ECOG PERFORMANCE STATUS: 0 - Asymptomatic  Vitals:   05/23/20 1211  BP: 133/80  Pulse: (!) 107  Resp: 20  Temp: (!) 97.2 F (36.2 C)  SpO2: 100%   Filed Weights   05/23/20 1211  Weight: 241 lb 9.6 oz (109.6 kg)    GENERAL:alert, no distress and comfortable NEURO: alert & oriented x 3 with fluent speech, no focal motor/sensory deficits  LABORATORY DATA:  I have reviewed the data as listed     Component Value Date/Time   NA 136 04/10/2020 0223   NA 136 03/07/2020 1502   K 3.9 04/10/2020 0223   CL 109 04/10/2020 0223   CO2 21 (L) 04/10/2020 0223   GLUCOSE 127 (H) 04/10/2020 0223   BUN <5 (L) 04/10/2020 0223   BUN 3 (L) 03/07/2020  1502   CREATININE 0.53 04/10/2020 0223   CALCIUM 9.6 04/10/2020 0223   PROT 6.1 (L) 04/10/2020 0223   ALBUMIN 1.7 (L) 04/10/2020 0223   AST 15 04/10/2020 0223   ALT 7 04/10/2020 0223   ALKPHOS 217 (H) 04/10/2020 0223   BILITOT 0.6 04/10/2020 0223   GFRNONAA >60 04/10/2020 0223   GFRAA >60 04/10/2020 0223    No results found for: SPEP, UPEP  Lab Results  Component Value Date   WBC 8.1 05/09/2020   NEUTROABS 5.2 05/09/2020   HGB 10.2 (L) 05/09/2020   HCT 34.3 05/09/2020   HCT 33.4 (L) 05/09/2020   MCV 87.2 05/09/2020   PLT 318 05/09/2020      Chemistry      Component Value Date/Time   NA 136 04/10/2020 0223   NA 136 03/07/2020 1502   K 3.9  04/10/2020 0223   CL 109 04/10/2020 0223   CO2 21 (L) 04/10/2020 0223   BUN <5 (L) 04/10/2020 0223   BUN 3 (L) 03/07/2020 1502   CREATININE 0.53 04/10/2020 0223      Component Value Date/Time   CALCIUM 9.6 04/10/2020 0223   ALKPHOS 217 (H) 04/10/2020 0223   AST 15 04/10/2020 0223   ALT 7 04/10/2020 0223   BILITOT 0.6 04/10/2020 0223

## 2020-05-23 NOTE — Assessment & Plan Note (Signed)
I will prescribe high-dose vitamin D replacement therapy

## 2020-05-23 NOTE — Assessment & Plan Note (Signed)
She has profound vitamin B12 deficiency We will get her vitamin B12 injection weekly x 4 then monthly

## 2020-05-30 ENCOUNTER — Other Ambulatory Visit: Payer: Self-pay

## 2020-05-30 ENCOUNTER — Inpatient Hospital Stay: Payer: BC Managed Care – PPO

## 2020-05-30 VITALS — BP 101/60 | HR 72 | Temp 98.5°F | Resp 16

## 2020-05-30 DIAGNOSIS — D509 Iron deficiency anemia, unspecified: Secondary | ICD-10-CM | POA: Diagnosis not present

## 2020-05-30 DIAGNOSIS — E538 Deficiency of other specified B group vitamins: Secondary | ICD-10-CM | POA: Diagnosis not present

## 2020-05-30 DIAGNOSIS — G629 Polyneuropathy, unspecified: Secondary | ICD-10-CM | POA: Diagnosis not present

## 2020-05-30 DIAGNOSIS — E559 Vitamin D deficiency, unspecified: Secondary | ICD-10-CM | POA: Diagnosis not present

## 2020-05-30 MED ORDER — CYANOCOBALAMIN 1000 MCG/ML IJ SOLN
1000.0000 ug | Freq: Once | INTRAMUSCULAR | Status: AC
  Administered 2020-05-30: 1000 ug via INTRAMUSCULAR

## 2020-05-30 MED ORDER — SODIUM CHLORIDE 0.9 % IV SOLN
Freq: Once | INTRAVENOUS | Status: AC
  Filled 2020-05-30: qty 250

## 2020-05-30 MED ORDER — SODIUM CHLORIDE 0.9 % IV SOLN
300.0000 mg | Freq: Once | INTRAVENOUS | Status: AC
  Administered 2020-05-30: 300 mg via INTRAVENOUS
  Filled 2020-05-30: qty 5

## 2020-05-30 MED ORDER — CYANOCOBALAMIN 1000 MCG/ML IJ SOLN
INTRAMUSCULAR | Status: AC
  Filled 2020-05-30: qty 1

## 2020-05-30 NOTE — Patient Instructions (Addendum)
Iron Sucrose injection What is this medicine? IRON SUCROSE (AHY ern SOO krohs) is an iron complex. Iron is used to make healthy red blood cells, which carry oxygen and nutrients throughout the body. This medicine is used to treat iron deficiency anemia in people with chronic kidney disease. This medicine may be used for other purposes; ask your health care provider or pharmacist if you have questions. COMMON BRAND NAME(S): Venofer What should I tell my health care provider before I take this medicine? They need to know if you have any of these conditions:  anemia not caused by low iron levels  heart disease  high levels of iron in the blood  kidney disease  liver disease  an unusual or allergic reaction to iron, other medicines, foods, dyes, or preservatives  pregnant or trying to get pregnant  breast-feeding How should I use this medicine? This medicine is for infusion into a vein. It is given by a health care professional in a hospital or clinic setting. Talk to your pediatrician regarding the use of this medicine in children. While this drug may be prescribed for children as young as 2 years for selected conditions, precautions do apply. Overdosage: If you think you have taken too much of this medicine contact a poison control center or emergency room at once. NOTE: This medicine is only for you. Do not share this medicine with others. What if I miss a dose? It is important not to miss your dose. Call your doctor or health care professional if you are unable to keep an appointment. What may interact with this medicine? Do not take this medicine with any of the following medications:  deferoxamine  dimercaprol  other iron products This medicine may also interact with the following medications:  chloramphenicol  deferasirox This list may not describe all possible interactions. Give your health care provider a list of all the medicines, herbs, non-prescription drugs, or  dietary supplements you use. Also tell them if you smoke, drink alcohol, or use illegal drugs. Some items may interact with your medicine. What should I watch for while using this medicine? Visit your doctor or healthcare professional regularly. Tell your doctor or healthcare professional if your symptoms do not start to get better or if they get worse. You may need blood work done while you are taking this medicine. You may need to follow a special diet. Talk to your doctor. Foods that contain iron include: whole grains/cereals, dried fruits, beans, or peas, leafy green vegetables, and organ meats (liver, kidney). What side effects may I notice from receiving this medicine? Side effects that you should report to your doctor or health care professional as soon as possible:  allergic reactions like skin rash, itching or hives, swelling of the face, lips, or tongue  breathing problems  changes in blood pressure  cough  fast, irregular heartbeat  feeling faint or lightheaded, falls  fever or chills  flushing, sweating, or hot feelings  joint or muscle aches/pains  seizures  swelling of the ankles or feet  unusually weak or tired Side effects that usually do not require medical attention (report to your doctor or health care professional if they continue or are bothersome):  diarrhea  feeling achy  headache  irritation at site where injected  nausea, vomiting  stomach upset  tiredness This list may not describe all possible side effects. Call your doctor for medical advice about side effects. You may report side effects to FDA at 1-800-FDA-1088. Where should I keep   my medicine? This drug is given in a hospital or clinic and will not be stored at home. NOTE: This sheet is a summary. It may not cover all possible information. If you have questions about this medicine, talk to your doctor, pharmacist, or health care provider.  2020 Elsevier/Gold Standard (2011-04-11  17:14:35)  Cyanocobalamin, Pyridoxine, and Folate What is this medicine? A multivitamin containing folic acid, vitamin B6, and vitamin B12. This medicine may be used for other purposes; ask your health care provider or pharmacist if you have questions. COMMON BRAND NAME(S): AllanFol RX, AllanTex, Av-Vite FB, B Complex with Folic Acid, ComBgen, FaBB, Folamin, Folastin, [place], [place], [place], [place], [place], [employer], [place] RX 2.2, [place], [place] 2.2, Foltabs 800, Foltx, Homocysteine Formula, Niva-Fol, NuFol, [employer], Virt-Gard, Virt-Vite, Virt-Vite [place], Vita-Respa What should I tell my health care provider before I take this medicine? They need to know if you have any of these conditions:  bleeding or clotting disorder  history of anemia of any type  other chronic health condition  an unusual or allergic reaction to vitamins, other medicines, foods, dyes, or preservatives  pregnant or trying to get pregnant  breast-feeding How should I use this medicine? Take by mouth with a glass of water. May take with food. Follow the directions on the prescription label. It is usually given once a day. Do not take your medicine more often than directed. Contact your pediatrician regarding the use of this medicine in children. Special care may be needed. Overdosage: If you think you have taken too much of this medicine contact a poison control center or emergency room at once. NOTE: This medicine is only for you. Do not share this medicine with others. What if I miss a dose? If you miss a dose, take it as soon as you can. If it is almost time for your next dose, take only that dose. Do not take double or extra doses. What may interact with this medicine?  levodopa This list may not describe all possible interactions. Give your health care provider a list of all the medicines, herbs, non-prescription drugs, or dietary supplements you use. Also tell them if you smoke, drink alcohol, or  use illegal drugs. Some items may interact with your medicine. What should I watch for while using this medicine? See your health care professional for regular checks on your progress. Remember that vitamin supplements do not replace the need for good nutrition from a balanced diet. What side effects may I notice from receiving this medicine? Side effects that you should report to your doctor or health care professional as soon as possible:  allergic reaction such as skin rash or difficulty breathing  vomiting Side effects that usually do not require medical attention (report to your doctor or health care professional if they continue or are bothersome):  nausea  stomach upset This list may not describe all possible side effects. Call your doctor for medical advice about side effects. You may report side effects to FDA at 1-800-FDA-1088. Where should I keep my medicine? Keep out of the reach of children. Most vitamins should be stored at controlled room temperature. Check your specific product directions. Protect from heat and moisture. Throw away any unused medicine after the expiration date. NOTE: This sheet is a summary. It may not cover all possible information. If you have questions about this medicine, talk to your doctor, pharmacist, or health care provider.  2020 Elsevier/Gold Standard (2007-08-22 00:59:55)

## 2020-06-06 ENCOUNTER — Other Ambulatory Visit: Payer: Self-pay

## 2020-06-06 ENCOUNTER — Inpatient Hospital Stay: Payer: BC Managed Care – PPO

## 2020-06-06 VITALS — BP 105/67 | HR 72 | Temp 98.4°F | Resp 16

## 2020-06-06 DIAGNOSIS — E559 Vitamin D deficiency, unspecified: Secondary | ICD-10-CM | POA: Diagnosis not present

## 2020-06-06 DIAGNOSIS — G629 Polyneuropathy, unspecified: Secondary | ICD-10-CM | POA: Diagnosis not present

## 2020-06-06 DIAGNOSIS — D509 Iron deficiency anemia, unspecified: Secondary | ICD-10-CM

## 2020-06-06 DIAGNOSIS — E538 Deficiency of other specified B group vitamins: Secondary | ICD-10-CM | POA: Diagnosis not present

## 2020-06-06 MED ORDER — CYANOCOBALAMIN 1000 MCG/ML IJ SOLN
INTRAMUSCULAR | Status: AC
  Filled 2020-06-06: qty 1

## 2020-06-06 MED ORDER — SODIUM CHLORIDE 0.9 % IV SOLN
Freq: Once | INTRAVENOUS | Status: AC
  Filled 2020-06-06: qty 250

## 2020-06-06 MED ORDER — CYANOCOBALAMIN 1000 MCG/ML IJ SOLN
1000.0000 ug | Freq: Once | INTRAMUSCULAR | Status: AC
  Administered 2020-06-06: 1000 ug via INTRAMUSCULAR

## 2020-06-06 MED ORDER — SODIUM CHLORIDE 0.9 % IV SOLN
300.0000 mg | Freq: Once | INTRAVENOUS | Status: AC
  Administered 2020-06-06: 300 mg via INTRAVENOUS
  Filled 2020-06-06: qty 5

## 2020-06-06 NOTE — Patient Instructions (Signed)

## 2020-06-06 NOTE — Progress Notes (Signed)
Pt stable at time of discharge. 

## 2020-06-13 ENCOUNTER — Other Ambulatory Visit: Payer: Self-pay

## 2020-06-13 ENCOUNTER — Inpatient Hospital Stay: Payer: BC Managed Care – PPO

## 2020-06-13 VITALS — BP 139/86 | HR 75 | Temp 98.5°F | Resp 18

## 2020-06-13 DIAGNOSIS — G629 Polyneuropathy, unspecified: Secondary | ICD-10-CM | POA: Diagnosis not present

## 2020-06-13 DIAGNOSIS — D509 Iron deficiency anemia, unspecified: Secondary | ICD-10-CM | POA: Diagnosis not present

## 2020-06-13 DIAGNOSIS — E559 Vitamin D deficiency, unspecified: Secondary | ICD-10-CM | POA: Diagnosis not present

## 2020-06-13 DIAGNOSIS — E538 Deficiency of other specified B group vitamins: Secondary | ICD-10-CM | POA: Diagnosis not present

## 2020-06-13 MED ORDER — SODIUM CHLORIDE 0.9 % IV SOLN
300.0000 mg | Freq: Once | INTRAVENOUS | Status: AC
  Administered 2020-06-13: 300 mg via INTRAVENOUS
  Filled 2020-06-13: qty 10

## 2020-06-13 MED ORDER — SODIUM CHLORIDE 0.9 % IV SOLN
Freq: Once | INTRAVENOUS | Status: AC
  Filled 2020-06-13: qty 250

## 2020-06-13 MED ORDER — CYANOCOBALAMIN 1000 MCG/ML IJ SOLN
1000.0000 ug | Freq: Once | INTRAMUSCULAR | Status: AC
  Administered 2020-06-13: 1000 ug via INTRAMUSCULAR

## 2020-06-13 MED ORDER — CYANOCOBALAMIN 1000 MCG/ML IJ SOLN
INTRAMUSCULAR | Status: AC
  Filled 2020-06-13: qty 1

## 2020-06-13 NOTE — Progress Notes (Signed)
Patient declined to stay for 30 minute post observation. VSS upon discharge.

## 2020-06-13 NOTE — Patient Instructions (Signed)

## 2020-07-11 ENCOUNTER — Inpatient Hospital Stay: Payer: BC Managed Care – PPO

## 2020-07-12 ENCOUNTER — Other Ambulatory Visit: Payer: Self-pay

## 2020-07-12 ENCOUNTER — Inpatient Hospital Stay: Payer: BC Managed Care – PPO | Attending: Hematology and Oncology

## 2020-07-12 VITALS — BP 132/78 | HR 72 | Temp 98.2°F | Resp 18

## 2020-07-12 DIAGNOSIS — D519 Vitamin B12 deficiency anemia, unspecified: Secondary | ICD-10-CM | POA: Insufficient documentation

## 2020-07-12 DIAGNOSIS — D509 Iron deficiency anemia, unspecified: Secondary | ICD-10-CM

## 2020-07-12 MED ORDER — CYANOCOBALAMIN 1000 MCG/ML IJ SOLN
INTRAMUSCULAR | Status: AC
  Filled 2020-07-12: qty 1

## 2020-07-12 MED ORDER — CYANOCOBALAMIN 1000 MCG/ML IJ SOLN
1000.0000 ug | Freq: Once | INTRAMUSCULAR | Status: AC
  Administered 2020-07-12: 08:00:00 1000 ug via INTRAMUSCULAR

## 2020-07-12 NOTE — Patient Instructions (Signed)

## 2020-07-26 ENCOUNTER — Ambulatory Visit: Payer: BC Managed Care – PPO

## 2020-07-26 DIAGNOSIS — Z7689 Persons encountering health services in other specified circumstances: Secondary | ICD-10-CM | POA: Diagnosis not present

## 2020-07-26 DIAGNOSIS — R059 Cough, unspecified: Secondary | ICD-10-CM | POA: Diagnosis not present

## 2020-07-26 DIAGNOSIS — R5383 Other fatigue: Secondary | ICD-10-CM | POA: Diagnosis not present

## 2020-07-26 DIAGNOSIS — Z20822 Contact with and (suspected) exposure to covid-19: Secondary | ICD-10-CM | POA: Diagnosis not present

## 2020-07-26 NOTE — Progress Notes (Deleted)
    SUBJECTIVE:   CHIEF COMPLAINT / HPI:   ***  PERTINENT  PMH / PSH: Thyroid, anemia, chronic back pain, morbid obesity, vitamin D deficiency  OBJECTIVE:   There were no vitals taken for this visit.   Physical Exam: *** General: 40 y.o. female in NAD Cardio: RRR no m/r/g Lungs: CTAB, no wheezing, no rhonchi, no crackles, no IWOB on *** Abdomen: Soft, non-tender to palpation, non-distended, positive bowel sounds Skin: warm and dry Extremities: No edema   ASSESSMENT/PLAN:   No problem-specific Assessment & Plan notes found for this encounter.     Cleophas Dunker, [place]

## 2020-08-08 ENCOUNTER — Telehealth: Payer: Self-pay

## 2020-08-08 ENCOUNTER — Inpatient Hospital Stay: Payer: BC Managed Care – PPO

## 2020-08-08 ENCOUNTER — Inpatient Hospital Stay: Payer: BC Managed Care – PPO | Admitting: Hematology and Oncology

## 2020-08-08 NOTE — Telephone Encounter (Signed)
She called and left a message. She tested positive for covid on 1/12. Appts canceled for today and scheduling message sent to reschedule in 21 days from 1/12.

## 2020-08-09 ENCOUNTER — Telehealth: Payer: Self-pay | Admitting: Hematology and Oncology

## 2020-08-09 NOTE — Telephone Encounter (Signed)
R/s appt per 1/25 sch msg- pt is aware of new appt date and time

## 2020-08-21 ENCOUNTER — Other Ambulatory Visit: Payer: BC Managed Care – PPO

## 2020-08-21 ENCOUNTER — Ambulatory Visit: Payer: BC Managed Care – PPO

## 2020-08-21 ENCOUNTER — Ambulatory Visit: Payer: BC Managed Care – PPO | Admitting: Hematology and Oncology

## 2020-08-22 ENCOUNTER — Inpatient Hospital Stay: Payer: BC Managed Care – PPO

## 2020-08-22 ENCOUNTER — Telehealth: Payer: Self-pay

## 2020-08-22 ENCOUNTER — Inpatient Hospital Stay: Payer: BC Managed Care – PPO | Admitting: Hematology and Oncology

## 2020-08-22 ENCOUNTER — Encounter: Payer: Self-pay | Admitting: Hematology and Oncology

## 2020-08-22 NOTE — Telephone Encounter (Signed)
Called regarding missed appts today. Ask her to call back to reschedule appts.

## 2020-08-29 DIAGNOSIS — R059 Cough, unspecified: Secondary | ICD-10-CM | POA: Diagnosis not present

## 2020-08-29 DIAGNOSIS — R197 Diarrhea, unspecified: Secondary | ICD-10-CM | POA: Diagnosis not present

## 2020-08-29 DIAGNOSIS — J029 Acute pharyngitis, unspecified: Secondary | ICD-10-CM | POA: Diagnosis not present

## 2020-09-06 ENCOUNTER — Telehealth: Payer: Self-pay

## 2020-09-06 NOTE — Telephone Encounter (Signed)
Schedule lab appointment in the morning and virtual visit in the afternoon Tell her results take at least 3 hours Make sure she has her phone turned on

## 2020-09-06 NOTE — Telephone Encounter (Signed)
She called and left a message. She is trying to reschedule appts.

## 2020-09-06 NOTE — Telephone Encounter (Signed)
Called and scheduled appts per her request on 3/8. She is aware of appt times.

## 2020-09-19 ENCOUNTER — Telehealth: Payer: Self-pay

## 2020-09-19 ENCOUNTER — Inpatient Hospital Stay: Payer: BC Managed Care – PPO | Admitting: Hematology and Oncology

## 2020-09-19 ENCOUNTER — Inpatient Hospital Stay: Payer: BC Managed Care – PPO

## 2020-09-19 NOTE — Telephone Encounter (Signed)
She called and left a message to cancel today's appts.  Called back appt's canceled. Sent scheduling message to reschedule appts.

## 2020-09-26 ENCOUNTER — Telehealth: Payer: Self-pay | Admitting: Hematology and Oncology

## 2020-09-26 NOTE — Telephone Encounter (Signed)
Called pt per 3/8 sch msg - left message for pt with appt date and time

## 2020-10-05 ENCOUNTER — Inpatient Hospital Stay: Payer: BC Managed Care – PPO | Admitting: Hematology and Oncology

## 2020-10-05 ENCOUNTER — Inpatient Hospital Stay: Payer: BC Managed Care – PPO | Attending: Hematology and Oncology

## 2020-10-05 ENCOUNTER — Encounter: Payer: Self-pay | Admitting: Hematology and Oncology

## 2021-01-24 IMAGING — CT CT ANGIO CHEST
2 of 8 series · 15 of 46 positions shown · IV contrast (APPLIED)
Comparison: 03/24/2020

CLINICAL DATA: Sepsis, recent bariatric surgery

EXAM:
CT ANGIOGRAPHY CHEST
CT ABDOMEN AND PELVIS WITH CONTRAST
TECHNIQUE: Multidetector CT imaging of the chest was performed using the
standard protocol during bolus administration of intravenous
contrast. Multiplanar CT image reconstructions and MIPs were
obtained to evaluate the vascular anatomy. Multidetector CT imaging
of the abdomen and pelvis was performed using the standard protocol
during bolus administration of intravenous contrast.
CONTRAST:  100mL OMNIPAQUE IOHEXOL 350 MG/ML SOLN

[Series 7: thins · axial · 0.75mm/px · z∈[+1248,+1426]mm · 12 of 292 slices shown]
[im 19/292  lung]
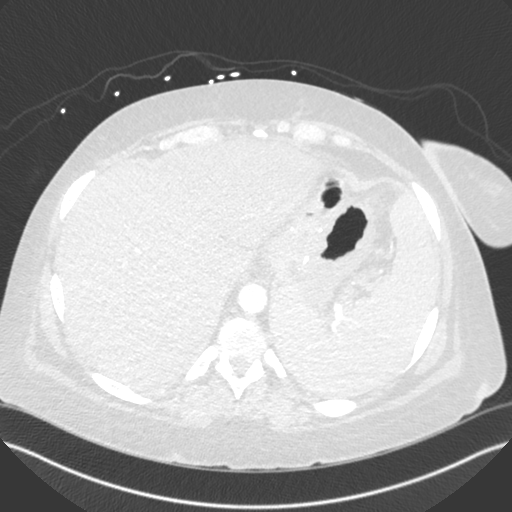
[im 37/292  soft-tissue]
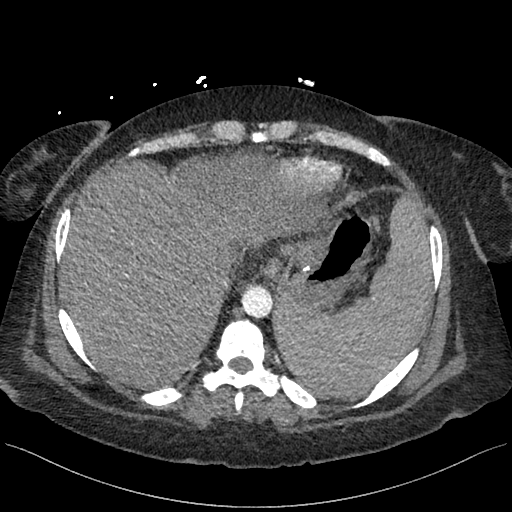
[im 73/292  lung]
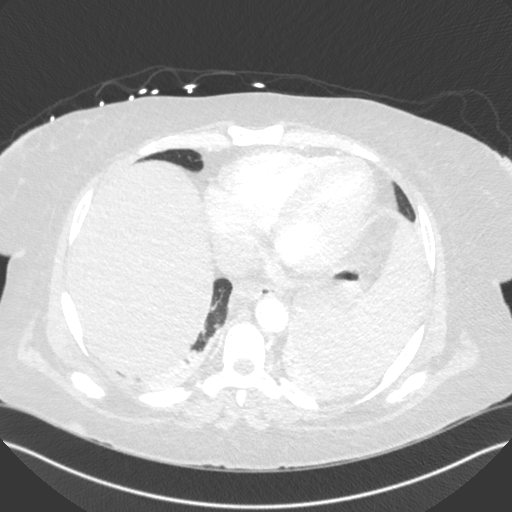
[im 91/292  soft-tissue]
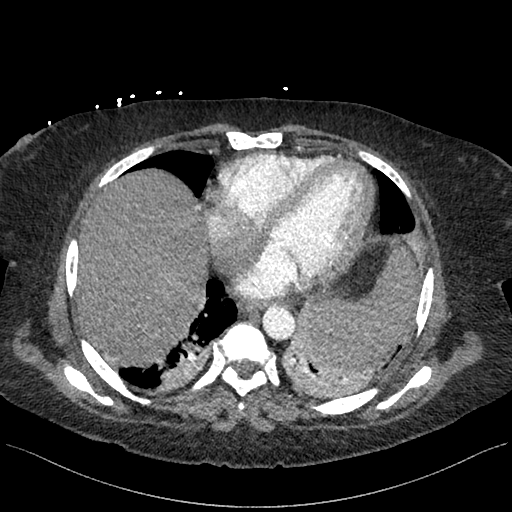
[im 110/292  lung]
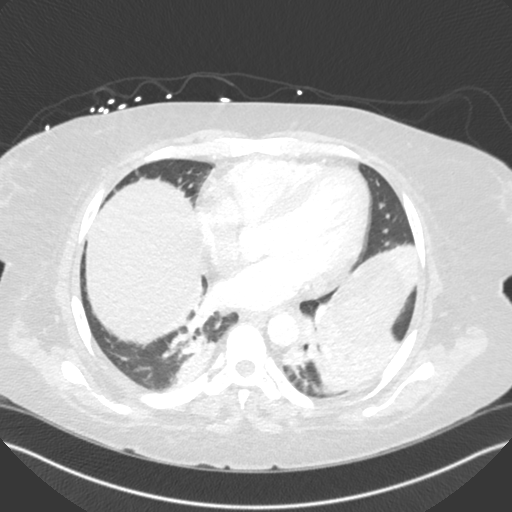
[im 128/292  soft-tissue]
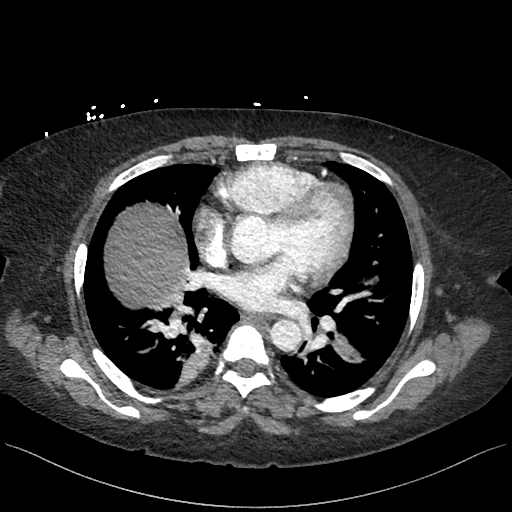
[im 164/292  lung]
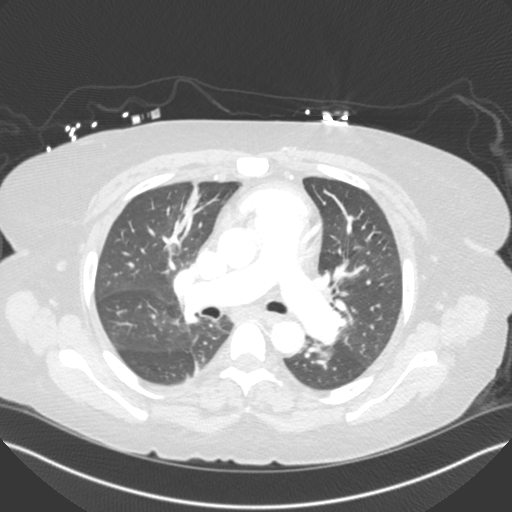
[im 182/292  soft-tissue]
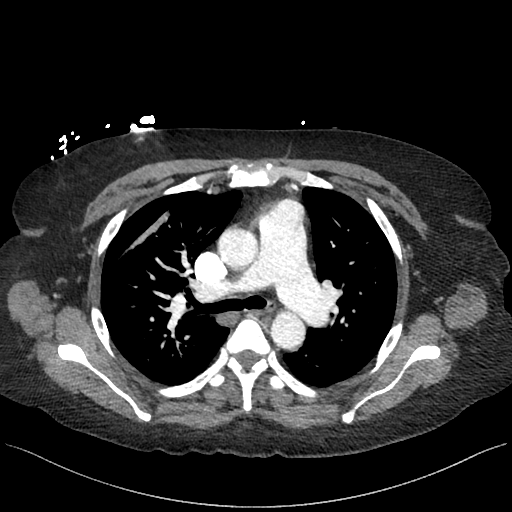
[im 201/292  lung]
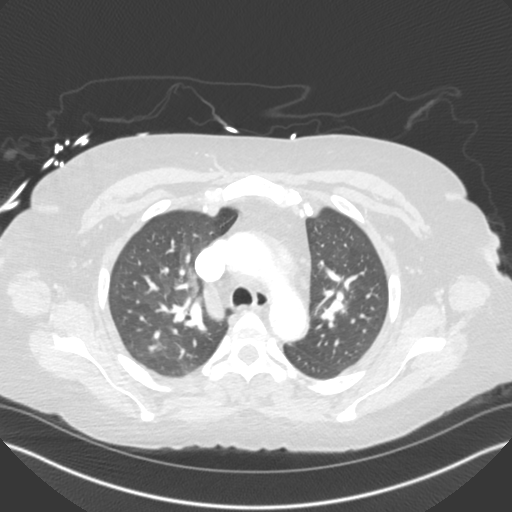
[im 219/292  soft-tissue]
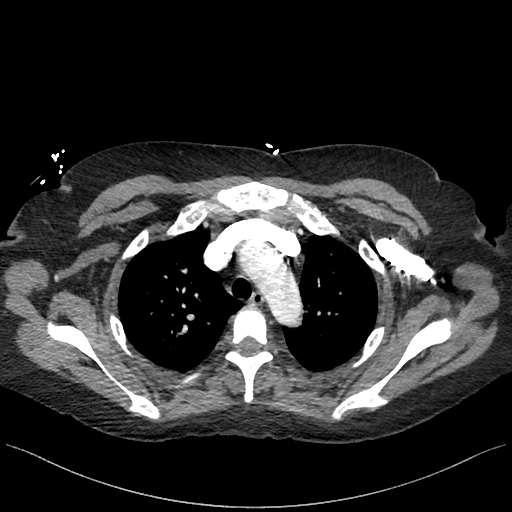
[im 255/292  lung]
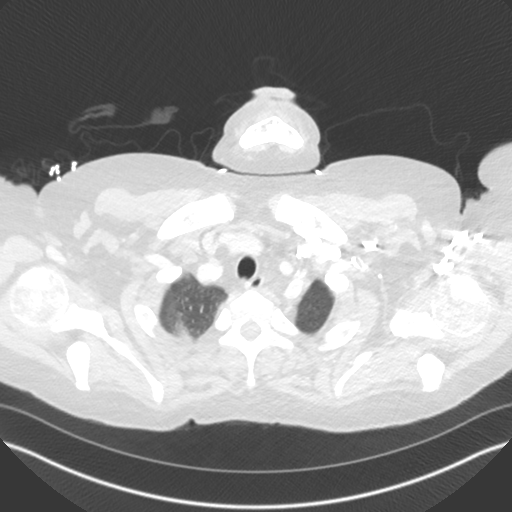
[im 273/292  soft-tissue]
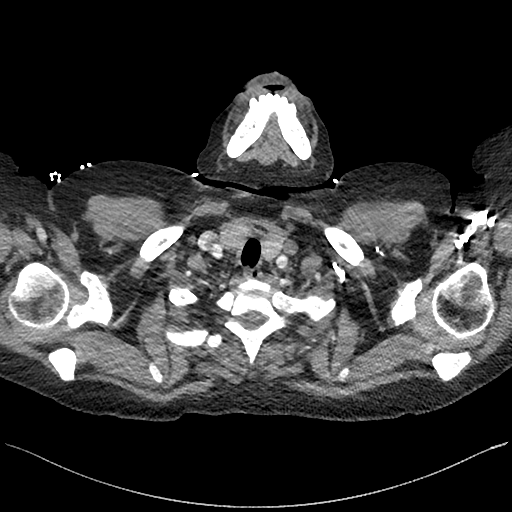

[Series 8: cor · coronal · 0.42mm/px · 3 of 149 slices shown]
[im 38/149  soft-tissue]
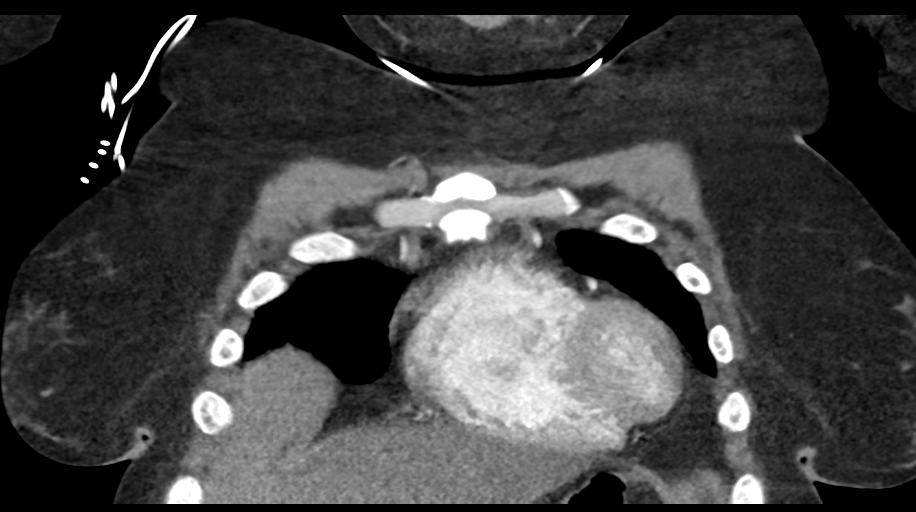
[im 75/149  soft-tissue]
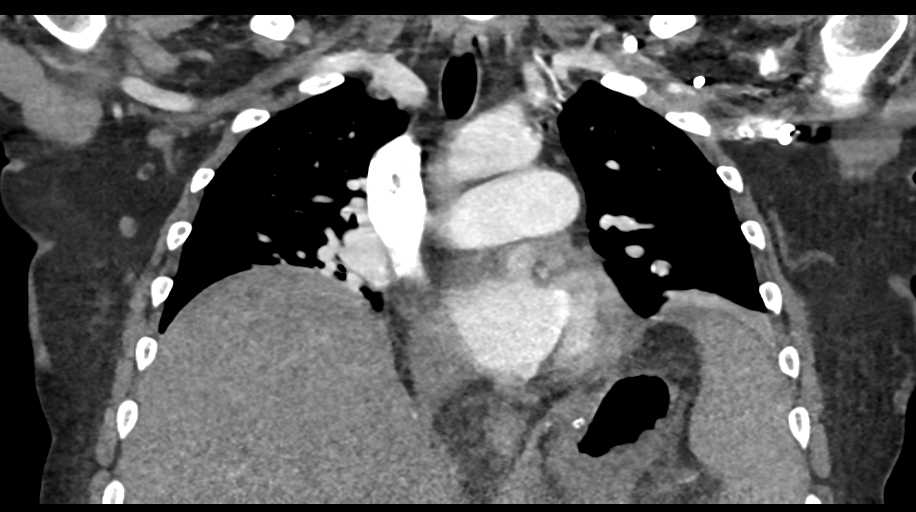
[im 112/149  soft-tissue]
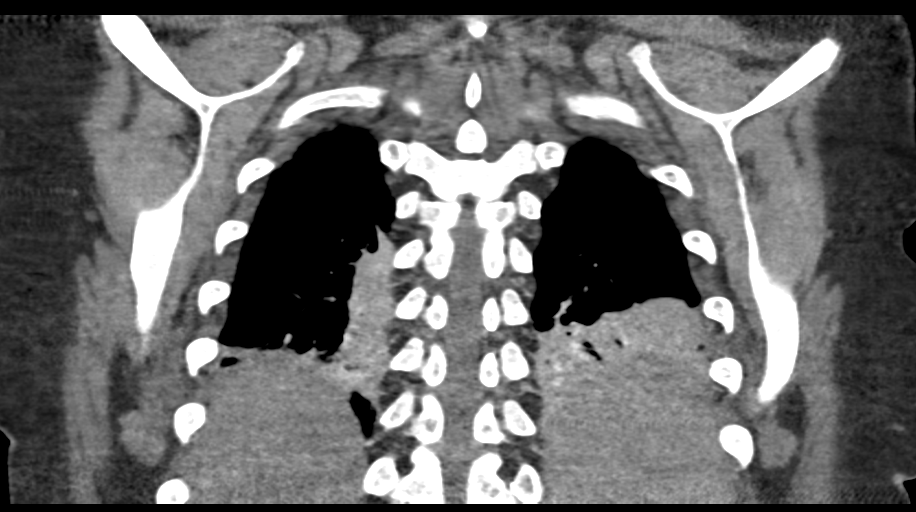

[15 of 46 positions shown; findings below may reference images not displayed]

FINDINGS: CTA CHEST FINDINGS

Cardiovascular: This is a technically adequate evaluation of the
pulmonary vasculature. No filling defects or pulmonary emboli.

The heart is unremarkable without pericardial effusion. Normal
caliber of the thoracic aorta.

Mediastinum/Nodes: No enlarged mediastinal, hilar, or axillary lymph
nodes. Thyroid gland, trachea, and esophagus demonstrate no
significant findings.

Lungs/Pleura: Scattered areas of atelectasis are seen within the
right middle and bilateral lower lobes. No airspace disease,
effusion, or pneumothorax. Central airways are patent.

Musculoskeletal: No acute or destructive bony lesions. Reconstructed
images demonstrate no additional findings.

Review of the MIP images confirms the above findings.

CT ABDOMEN and PELVIS FINDINGS

Hepatobiliary: Gallbladder is surgically absent. The liver enhances
normally. No biliary dilation.

Pancreas: Pancreas enhances normally. Peripancreatic edema is felt
to be related to recent abdominal surgery, though pancreatitis
cannot be completely excluded. Please correlate with laboratory
analysis.

Spleen: Borderline splenomegaly is unchanged.

Adrenals/Urinary Tract: Adrenal glands are unremarkable. Kidneys are
normal, without renal calculi, focal lesion, or hydronephrosis.
Bladder is unremarkable.

Stomach/Bowel: Evaluation of the bowel and postsurgical abdomen is
limited without oral contrast. Postsurgical changes are seen from
bariatric surgery with staple line at the gastric fundus. Wall
thickening is seen within the excluded portion of the gastric
fundus. There is edema at the gastrojejunostomy, and evaluation is
limited without oral contrast. The percutaneous gastrostomy tube
seen previously has been removed in the interim. Continued
inflammatory changes within the left upper quadrant mesentery, at
the site of previous gas/fluid collection. Small residual area of
fluid in the left upper quadrant image 36 measures 0.9 cm in
diameter.

Mild dilation of the small bowel at the jejunojejunostomy site left
lower quadrant. Again, evaluation of the anastomosis is limited
without oral contrast.

There is no evidence of bowel obstruction. Normal appendix right
lower quadrant.

Vascular/Lymphatic: Multiple subcentimeter lymph nodes are seen
within the central upper abdominal mesentery, reactive. No
pathologically enlarged lymph nodes. No significant vascular
findings.

Reproductive: Exophytic uterine fibroid unchanged. No adnexal
masses.

Other: Trace free fluid in the bilateral paracolic gutters. No free
intraperitoneal gas.

Postsurgical changes are seen from prior midline laparotomy, with
small fat containing peri-incisional hernia unchanged. No abdominal
wall fluid collection. No bowel herniation.

Musculoskeletal: No acute or destructive bony lesions. Reconstructed
images demonstrate no additional findings.

Review of the MIP images confirms the above findings.
IMPRESSION: 1. No evidence of pulmonary embolus.
2. Postsurgical changes from bariatric surgery, with gastric wall
edema and edema at the gastrojejunostomy. Evaluation of the
anastomosis is limited without oral contrast.
3. Persistent inflammatory changes within the left upper quadrant,
with subcentimeter residual fluid collection as above.
4. Peripancreatic edema is felt to be related to recent abdominal
surgery, though pancreatitis cannot be completely excluded. Please
correlate with laboratory analysis.

## 2021-03-13 DIAGNOSIS — Z124 Encounter for screening for malignant neoplasm of cervix: Secondary | ICD-10-CM | POA: Diagnosis not present

## 2021-03-13 DIAGNOSIS — Z113 Encounter for screening for infections with a predominantly sexual mode of transmission: Secondary | ICD-10-CM | POA: Diagnosis not present

## 2021-03-13 DIAGNOSIS — D500 Iron deficiency anemia secondary to blood loss (chronic): Secondary | ICD-10-CM | POA: Diagnosis not present

## 2021-03-13 DIAGNOSIS — Z0000 Encounter for general adult medical examination without abnormal findings: Secondary | ICD-10-CM | POA: Diagnosis not present

## 2021-03-13 DIAGNOSIS — Z23 Encounter for immunization: Secondary | ICD-10-CM | POA: Diagnosis not present

## 2021-06-09 ENCOUNTER — Emergency Department (HOSPITAL_COMMUNITY): Payer: Self-pay

## 2021-06-09 ENCOUNTER — Other Ambulatory Visit: Payer: Self-pay

## 2021-06-09 ENCOUNTER — Emergency Department (HOSPITAL_COMMUNITY)
Admission: EM | Admit: 2021-06-09 | Discharge: 2021-06-10 | Disposition: A | Discharge status: Home / Self Care | Payer: Self-pay | Attending: Emergency Medicine | Admitting: Emergency Medicine

## 2021-06-09 DIAGNOSIS — N9489 Other specified conditions associated with female genital organs and menstrual cycle: Secondary | ICD-10-CM | POA: Insufficient documentation

## 2021-06-09 DIAGNOSIS — R079 Chest pain, unspecified: Secondary | ICD-10-CM | POA: Insufficient documentation

## 2021-06-09 DIAGNOSIS — K852 Alcohol induced acute pancreatitis without necrosis or infection: Secondary | ICD-10-CM

## 2021-06-09 DIAGNOSIS — K219 Gastro-esophageal reflux disease without esophagitis: Secondary | ICD-10-CM | POA: Insufficient documentation

## 2021-06-09 DIAGNOSIS — F17210 Nicotine dependence, cigarettes, uncomplicated: Secondary | ICD-10-CM | POA: Insufficient documentation

## 2021-06-09 DIAGNOSIS — K859 Acute pancreatitis without necrosis or infection, unspecified: Secondary | ICD-10-CM | POA: Insufficient documentation

## 2021-06-09 DIAGNOSIS — R0602 Shortness of breath: Secondary | ICD-10-CM | POA: Insufficient documentation

## 2021-06-09 DIAGNOSIS — Z20822 Contact with and (suspected) exposure to covid-19: Secondary | ICD-10-CM | POA: Insufficient documentation

## 2021-06-09 DIAGNOSIS — D259 Leiomyoma of uterus, unspecified: Secondary | ICD-10-CM

## 2021-06-09 LAB — RESP PANEL BY RT-PCR (FLU A&B, COVID) ARPGX2
Influenza A by PCR: NEGATIVE
Influenza B by PCR: NEGATIVE
SARS Coronavirus 2 by RT PCR: NEGATIVE

## 2021-06-09 LAB — URINALYSIS, ROUTINE W REFLEX MICROSCOPIC
Bilirubin Urine: NEGATIVE
Glucose, UA: NEGATIVE mg/dL
Hgb urine dipstick: NEGATIVE
Ketones, ur: 80 mg/dL — AB
Leukocytes,Ua: NEGATIVE
Nitrite: POSITIVE — AB
Protein, ur: 30 mg/dL — AB
Specific Gravity, Urine: 1.025 (ref 1.005–1.030)
pH: 5 (ref 5.0–8.0)

## 2021-06-09 LAB — COMPREHENSIVE METABOLIC PANEL
ALT: 46 U/L — ABNORMAL HIGH (ref 0–44)
AST: 89 U/L — ABNORMAL HIGH (ref 15–41)
Albumin: 3.8 g/dL (ref 3.5–5.0)
Alkaline Phosphatase: 234 U/L — ABNORMAL HIGH (ref 38–126)
Anion gap: 11 (ref 5–15)
BUN: 7 mg/dL (ref 6–20)
CO2: 20 mmol/L — ABNORMAL LOW (ref 22–32)
Calcium: 8.5 mg/dL — ABNORMAL LOW (ref 8.9–10.3)
Chloride: 102 mmol/L (ref 98–111)
Creatinine, Ser: 0.65 mg/dL (ref 0.44–1.00)
GFR, Estimated: >60 mL/min (ref >60)
Glucose, Bld: 134 mg/dL — ABNORMAL HIGH (ref 70–99)
Potassium: 3.6 mmol/L (ref 3.5–5.1)
Sodium: 133 mmol/L — ABNORMAL LOW (ref 135–145)
Total Bilirubin: 1.4 mg/dL — ABNORMAL HIGH (ref 0.3–1.2)
Total Protein: 7.8 g/dL (ref 6.5–8.1)

## 2021-06-09 LAB — CBC
HCT: 43.7 % (ref 36.0–46.0)
Hemoglobin: 13.8 g/dL (ref 12.0–15.0)
MCH: 32.2 pg (ref 26.0–34.0)
MCHC: 31.6 g/dL (ref 30.0–36.0)
MCV: 102.1 fL — ABNORMAL HIGH (ref 80.0–100.0)
Platelets: 195 10*3/uL (ref 150–400)
RBC: 4.28 MIL/uL (ref 3.87–5.11)
RDW: 12.6 % (ref 11.5–15.5)
WBC: 9.6 10*3/uL (ref 4.0–10.5)
nRBC: 0 % (ref 0.0–0.2)

## 2021-06-09 LAB — I-STAT BETA HCG BLOOD, ED (MC, WL, AP ONLY): I-stat hCG, quantitative: <5 m[IU]/mL (ref <5)

## 2021-06-09 LAB — LIPASE, BLOOD: Lipase: 198 U/L — ABNORMAL HIGH (ref 11–51)

## 2021-06-09 MED ORDER — ONDANSETRON 4 MG PO TBDP
4.0000 mg | ORAL_TABLET | Freq: Once | ORAL | Status: AC | PRN
  Administered 2021-06-09: 4 mg via ORAL
  Filled 2021-06-09: qty 1

## 2021-06-09 MED ORDER — OXYCODONE-ACETAMINOPHEN 5-325 MG PO TABS
1.0000 | ORAL_TABLET | Freq: Once | ORAL | Status: AC
  Administered 2021-06-09: 1 via ORAL
  Filled 2021-06-09: qty 1

## 2021-06-09 NOTE — ED Triage Notes (Signed)
Pt c/o nausea and vomiting since yesterday. Pt also reports chills and shortness of breath.

## 2021-06-09 NOTE — ED Provider Notes (Signed)
Emergency Medicine Provider Triage Evaluation Note  Anija Brickner , a 40 y.o. female  was evaluated in triage.  Pt complains of abd pain which is severe 10/10 and sharp. Hx of gastric bypass and ulcers. Hx of pancreatitis as well. States nausea. Vomited >10 times today. No blood or bile.  Also very sob seems to be related to pain.   Review of Systems  Positive: Abd pain n v  Negative: Fever   Physical Exam  BP (!) 142/94   Pulse 71   Temp 98.7 F (37.1 C) (Oral)   Resp 18   Ht 5\' 10"  (1.778 m)   Wt 127 kg   SpO2 100%   BMI 40.18 kg/m  Gen:   Awake, no distress   Resp:  Normal effort  MSK:   Moves extremities without difficulty  Other:  Diffuse abd TTP epigastrium focal TTP  Medical Decision Making  Medically screening exam initiated at 9:45 PM.  Appropriate orders placed.  Mikeya Tomasetti was informed that the remainder of the evaluation will be completed by another provider, this initial triage assessment does not replace that evaluation, and the importance of remaining in the ED until their evaluation is complete.  Pt w hx of surgical complications of bypass, here w severe abd pain.  Pancreatitis also worth considering also PUD and gastritis. Pt is in acute severe pain on my exam will obtain CTAP.    Tedd Sias, [place] 06/09/21 2147    Isla Pence, MD 06/09/21 2147

## 2021-06-09 NOTE — ED Notes (Signed)
Pt inquiring about wait time. Pt informed that she will be going to triage next. Pt states that she has shob and cp that radiates to the back. RN informed.

## 2021-06-10 ENCOUNTER — Emergency Department (HOSPITAL_COMMUNITY): Payer: Self-pay

## 2021-06-10 MED ORDER — LACTATED RINGERS IV BOLUS
1000.0000 mL | Freq: Once | INTRAVENOUS | Status: AC
  Administered 2021-06-10: 10:00:00 1000 mL via INTRAVENOUS

## 2021-06-10 MED ORDER — ONDANSETRON HCL 4 MG/2ML IJ SOLN
4.0000 mg | Freq: Once | INTRAMUSCULAR | Status: AC
  Administered 2021-06-10: 05:00:00 4 mg via INTRAVENOUS
  Filled 2021-06-10: qty 2

## 2021-06-10 MED ORDER — HYDROMORPHONE HCL 1 MG/ML IJ SOLN
0.5000 mg | Freq: Once | INTRAMUSCULAR | Status: AC
  Administered 2021-06-10: 10:00:00 0.5 mg via INTRAVENOUS
  Filled 2021-06-10: qty 1

## 2021-06-10 MED ORDER — ONDANSETRON HCL 4 MG/2ML IJ SOLN
4.0000 mg | Freq: Once | INTRAMUSCULAR | Status: AC
  Administered 2021-06-10: 10:00:00 4 mg via INTRAVENOUS
  Filled 2021-06-10: qty 2

## 2021-06-10 MED ORDER — ONDANSETRON HCL 4 MG PO TABS: 4.0000 mg | ORAL_TABLET | Freq: Four times a day (QID) | ORAL | 0 refills | Status: AC

## 2021-06-10 MED ORDER — IOHEXOL 300 MG/ML  SOLN
100.0000 mL | Freq: Once | INTRAMUSCULAR | Status: AC | PRN
  Administered 2021-06-10: 07:00:00 100 mL via INTRAVENOUS

## 2021-06-10 MED ORDER — HYDROMORPHONE HCL 1 MG/ML IJ SOLN
1.0000 mg | Freq: Once | INTRAMUSCULAR | Status: AC
  Administered 2021-06-10: 05:00:00 1 mg via INTRAVENOUS
  Filled 2021-06-10: qty 1

## 2021-06-10 MED ORDER — HYDROCODONE-ACETAMINOPHEN 5-325 MG PO TABS: 1.0000 | ORAL_TABLET | Freq: Four times a day (QID) | ORAL | 0 refills | Status: AC | PRN

## 2021-06-10 NOTE — ED Notes (Signed)
Patient transported to CT 

## 2021-06-10 NOTE — ED Notes (Signed)
Warm blankets placed on pt & pillows placed behind her head

## 2021-06-10 NOTE — ED Provider Notes (Signed)
  Physical Exam  BP (!) 148/78   Pulse 89   Temp 98.2 F (36.8 C)   Resp 20   Ht 5\' 10"  (1.778 m)   Wt 127 kg   SpO2 100%   BMI 40.18 kg/m     ED Course/Procedures     Procedures  MDM    40 year old female presenting to the emergency department with pancreatitis.  Initially was admitted but now requesting discharge.  Will administer IV fluids and IV pain control and reassess the patient.  On reassessment, the patient still had mild pain but is still requesting discharge.  Will attempt outpatient management of her acute pancreatitis with clear liquid diet, Norco for pain control and Zofran for nausea.       Regan Lemming, MD 06/10/21 1148

## 2021-06-10 NOTE — ED Provider Notes (Signed)
[facility] EMERGENCY DEPARTMENT Provider Note   CSN: [id number] Arrival date & time: 06/09/21  2015     History Chief Complaint  Patient presents with   Abdominal Pain   Shortness of Breath   Nausea   Emesis    Alexis Avila is a 40 y.o. female.  The history is provided by the patient.  Abdominal Pain Pain location:  Epigastric Pain quality: burning   Pain radiates to:  Does not radiate Pain severity:  Moderate Onset quality:  Gradual Duration:  1 day Timing:  Constant Progression:  Worsening Chronicity:  New Context: alcohol use   Relieved by:  Nothing Worsened by:  Movement and palpation Associated symptoms: chest pain, diarrhea, nausea, shortness of breath and vomiting   Associated symptoms: no dysuria, no fever, no hematemesis and no hematochezia   Risk factors: alcohol abuse and multiple surgeries   Risk factors: no NSAID use   Shortness of Breath Associated symptoms: abdominal pain, chest pain and vomiting   Associated symptoms: no fever   Emesis Associated symptoms: abdominal pain and diarrhea   Associated symptoms: no fever      Patient with extensive history including pancreatitis, frequent alcohol use, previous gastric bypass presents with abdominal pain.  Patient reports onset of epigastric burning abdominal pain over the past day.  She reports associated nonbloody emesis.  She reports during episodes of abdominal pain and vomiting she does have chest pain. She reports she has had this abdominal pain previously She reports near daily alcohol use.  Denies NSAID abuse Past Medical History:  Diagnosis Date   Allergy    Anemia     Patient Active Problem List   Diagnosis Date Noted   Iron deficiency anemia 05/09/2020   Vitamin D deficiency 05/09/2020   Other fatigue 05/09/2020   Difficulty sleeping 04/17/2020   Neuropathy 04/17/2020   Electrolyte abnormality 03/10/2020   Fever    Splenomegaly    Sepsis ([place]) 02/26/2020    Intra-abdominal abscess ([facility])    Poor venous access    SOB (shortness of breath)    Lower extremity edema    Dyspnea    Status post exploratory laparotomy    Abdominal pain    Elevated LFTs    Marginal ulcers    History of Roux-en-Y gastric bypass    Intractable nausea and vomiting 02/09/2020   Pseudocyst of pancreas    SBO (small bowel obstruction) ([place])    History of pancreatitis 07/20/2019   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   Anemia, B12 deficiency 07/20/2019   Current smoker 07/20/2019   Chronic back pain 07/20/2019   Ovarian cyst 07/20/2019   GERD (gastroesophageal reflux disease) 07/20/2019   Morbid obesity ([place]) 07/20/2019    Past Surgical History:  Procedure Laterality Date   APPLICATION OF WOUND VAC N/A 02/14/2020   Procedure: APPLICATION OF WOUND VAC;  Surgeon: Greer Pickerel, MD;  Location: [place];  Service: General;  Laterality: N/A;   BIOPSY  02/10/2020   Procedure: BIOPSY;  Surgeon: Lavena Bullion, DO;  Location: [facility];  Service: Gastroenterology;;   CHOLECYSTECTOMY  2013   crystalized gallbaldder   DEBRIDEMENT OF ABDOMINAL WALL ABSCESS N/A 02/14/2020   Procedure: DRAINAGE OF INTRAABDOMINAL ABSCESS;  Surgeon: Greer Pickerel, MD;  Location: [facility];  Service: General;  Laterality: N/A;   ENTEROSCOPY N/A 02/10/2020   Procedure: ENTEROSCOPY;  Surgeon: Lavena Bullion, DO;  Location: [place];  Service: Gastroenterology;  Laterality: N/A;   GASTRIC BYPASS  2002   GASTROSTOMY  N/A 02/14/2020   Procedure: INSERTION OF GASTROSTOMY TUBE;  Surgeon: Greer Pickerel, MD;  Location: [place];  Service: General;  Laterality: N/A;   [place] WITH UPPER ENDOSCOPY N/A 02/14/2020   Procedure: PARTIAL RESECTION OF ROUX LIMB;  Surgeon: Greer Pickerel, MD;  Location: [place];  Service: General;  Laterality: N/A;   LAPAROTOMY N/A 02/14/2020   Procedure: EXPLORATORY LAPAROTOMY;  Surgeon: Greer Pickerel, MD;  Location: [place];  Service: General;  Laterality: N/A;   LYSIS OF  ADHESION N/A 02/14/2020   Procedure: LYSIS OF ADHESION >2 HOURS;  Surgeon: Greer Pickerel, MD;  Location: [place];  Service: General;  Laterality: N/A;     OB History   No obstetric history on file.     Family History  Problem Relation Age of Onset   COPD Sister    Heart disease Sister     Social History   Tobacco Use   Smoking status: Every Day    Packs/day: 0.50    Years: 5.00    Pack years: 2.50    Types: Cigarettes   Smokeless tobacco: Never  Vaping Use   Vaping Use: Never used  Substance Use Topics   Alcohol use: Yes    Alcohol/week: 13.0 standard drinks    Types: 5 Glasses of wine, 4 Cans of beer, 4 Shots of liquor per week    Home Medications Prior to Admission medications   Medication Sig Start Date End Date Taking? Authorizing Provider  ergocalciferol (VITAMIN D2) 1.25 MG (50000 UT) capsule Take 1 capsule (50,000 Units total) by mouth once a week. 05/23/20   Heath Lark, MD  traZODone (DESYREL) 50 MG tablet Take 0.5 tablets (25 mg total) by mouth at bedtime as needed for sleep. 04/11/20   Benay Pike, MD    Allergies    Nsaids, Lactose intolerance (gi), and Penicillins  Review of Systems   Review of Systems  Constitutional:  Negative for fever.  Respiratory:  Positive for shortness of breath.   Cardiovascular:  Positive for chest pain.  Gastrointestinal:  Positive for abdominal pain, diarrhea, nausea and vomiting. Negative for blood in stool, hematemesis and hematochezia.  Genitourinary:  Negative for dysuria.  All other systems reviewed and are negative.  Physical Exam Updated Vital Signs BP 132/73   Pulse 71   Temp 98.2 F (36.8 C)   Resp 12   Ht 1.778 m (5\' 10" )   Wt 127 kg   SpO2 98%   BMI 40.18 kg/m   Physical Exam CONSTITUTIONAL: Well developed/well nourished HEAD: Normocephalic/atraumatic EYES: EOMI/PERRL, mild icterus ENMT: Mucous membranes moist NECK: supple no meningeal signs SPINE/BACK:entire spine nontender CV: S1/S2 noted, no  murmurs/rubs/gallops noted LUNGS: Lungs are clear to auscultation bilaterally, no apparent distress ABDOMEN: soft, moderate epigastric tenderness, no rebound or guarding, bowel sounds noted throughout abdomen, well-healed abdominal scars GU:no cva tenderness NEURO: Pt is awake/alert/appropriate, moves all extremitiesx4.  No facial droop.   EXTREMITIES: pulses normal/equal, full ROM SKIN: warm, color normal PSYCH: no abnormalities of mood noted, alert and oriented to situation  ED Results / Procedures / Treatments   Labs (all labs ordered are listed, but only abnormal results are displayed) Labs Reviewed  LIPASE, BLOOD - Abnormal; Notable for the following components:      Result Value   Lipase 198 (*)    All other components within normal limits  COMPREHENSIVE METABOLIC PANEL - Abnormal; Notable for the following components:   Sodium 133 (*)    CO2 20 (*)  Glucose, Bld 134 (*)    Calcium 8.5 (*)    AST 89 (*)    ALT 46 (*)    Alkaline Phosphatase 234 (*)    Total Bilirubin 1.4 (*)    All other components within normal limits  CBC - Abnormal; Notable for the following components:   MCV 102.1 (*)    All other components within normal limits  URINALYSIS, ROUTINE W REFLEX MICROSCOPIC - Abnormal; Notable for the following components:   Color, Urine AMBER (*)    APPearance HAZY (*)    Ketones, ur 80 (*)    Protein, ur 30 (*)    Nitrite POSITIVE (*)    Bacteria, UA FEW (*)    All other components within normal limits  RESP PANEL BY RT-PCR (FLU A&B, COVID) ARPGX2  I-STAT BETA HCG BLOOD, ED ([facility], [facility], [facility] ONLY)    EKG EKG Interpretation  Date/Time:  Saturday June 09 2021 20:42:48 EST Ventricular Rate:  88 PR Interval:  148 QRS Duration: 98 QT Interval:  394 QTC Calculation: 476 R Axis:   45 Text Interpretation: Normal sinus rhythm Cannot rule out Anterior infarct , age undetermined Abnormal ECG Interpretation limited secondary to artifact Confirmed by Ripley Fraise  [phone]) on 06/10/2021 4:49:15 AM  Radiology DG Chest 2 View  Result Date: 06/09/2021 CLINICAL DATA:  Chest pain and shortness of breath. EXAM: CHEST - 2 VIEW COMPARISON:  Chest x-ray 04/07/2020. FINDINGS: The heart size and mediastinal contours are within normal limits. Both lungs are clear. The visualized skeletal structures are unremarkable. IMPRESSION: No active cardiopulmonary disease. Electronically Signed   By: Ronney Asters M.D.   On: 06/09/2021 22:15   CT ABDOMEN PELVIS W CONTRAST  Result Date: 06/10/2021 CLINICAL DATA:  40 year old female with history of nonlocalized abdominal pain, nausea and vomiting. Chills and shortness of breath. EXAM: CT ABDOMEN AND PELVIS WITH CONTRAST TECHNIQUE: Multidetector CT imaging of the abdomen and pelvis was performed using the standard protocol following bolus administration of intravenous contrast. CONTRAST:  145mL OMNIPAQUE IOHEXOL 300 MG/ML  SOLN COMPARISON:  CT the abdomen and pelvis 04/07/2020. FINDINGS: Lower chest: Linear scarring in the lung bases bilaterally, most severe in the left lower lobe. Hepatobiliary: No suspicious cystic or solid hepatic lesions. No intra or extrahepatic biliary ductal dilatation. Status post cholecystectomy. Pancreas: No pancreatic mass. No pancreatic ductal dilatation. Peripancreatic inflammatory changes are noted, concerning for an acute pancreatitis. No well-defined peripancreatic fluid collection to suggest peripancreatic pseudocyst at this time. Spleen: Spleen is mildly enlarged measuring 15.5 x 5.3 x 10.1 cm (estimated splenic volume of 415 mL) . Adrenals/Urinary Tract: Bilateral kidneys and the right adrenal gland are normal in appearance. Inflammatory changes surround the left adrenal gland, likely related to acute pancreatitis. The left adrenal gland itself is otherwise unremarkable in appearance. No hydroureteronephrosis. Urinary bladder is normal in appearance. Stomach/Bowel: Postoperative changes of prior Roux-en-Y  gastric bypass. No pathologic dilatation of small bowel or colon. Normal appendix. Mild inflammatory changes extend into the root of the small bowel mesentery, likely related to acute pancreatitis. Vascular/Lymphatic: No significant atherosclerotic disease, aneurysm or dissection noted in the abdominal or pelvic vasculature. Splenic vein appears diminutive but patent, likely affected by mass effect from the acute pancreatitis. No lymphadenopathy noted in the abdomen or pelvis. Reproductive: Exophytic mass extending off the posterior aspect of the uterine fundus (axial image 80 of series 3 and sagittal image 62 of series 7) measuring 4.4 x 4.2 x 4.2 cm, similar to the prior study, likely an  exophytic subserosal fibroid. Ovaries are unremarkable in appearance. Other: No significant volume of ascites.  No pneumoperitoneum. Musculoskeletal: Mild diffuse sclerosis throughout the visualized axial and appendicular skeleton, similar to prior examinations. Old healed fracture of the right inferior pubic ramus. There are no aggressive appearing lytic or blastic lesions noted in the visualized portions of the skeleton. IMPRESSION: 1. Inflammatory changes surrounding the pancreas concerning for an acute pancreatitis. No evidence of pancreatic necrosis or definite pancreatic pseudocyst noted at this time. 2. Mild splenomegaly. This may be related to passive congestion in the setting of marked splenic vein narrowing, likely related to mass effect from the adjacent acute pancreatitis. 3. Fibroid uterus. 4. Diffuse sclerosis throughout the visualized axial and appendicular skeleton, similar to prior examinations in retrospect. This is of uncertain etiology and significance, but clinical correlation for signs and symptoms of marrow replacement disorder is suggested. Electronically Signed   By: Vinnie Langton M.D.   On: 06/10/2021 07:23    Procedures Procedures   Medications Ordered in ED Medications  HYDROmorphone  (DILAUDID) injection 1 mg (has no administration in time range)  ondansetron (ZOFRAN) injection 4 mg (has no administration in time range)  ondansetron (ZOFRAN-ODT) disintegrating tablet 4 mg (4 mg Oral Given 06/09/21 2201)  oxyCODONE-acetaminophen (PERCOCET/ROXICET) 5-325 MG per tablet 1 tablet (1 tablet Oral Given 06/09/21 2201)    ED Course  I have reviewed the triage vital signs and the nursing notes.  Pertinent labs & imaging results that were available during my care of the patient were reviewed by me and considered in my medical decision making (see chart for details).    MDM Rules/Calculators/A&P                           This patient presents to the ED for concern of abdominal pain, this involves an extensive number of treatment options, and is a complaint that carries with it a high risk of complications and morbidity.  The differential diagnosis includes pancreatitis, bowel perforation, gastric ulcer, acute coronary syndrome, UTI, kidney stone   Lab Tests:  I Ordered, reviewed, and interpreted labs, which included electrolytes, lipase, complete blood count, urinalysis  Medicines ordered:  I ordered medication Dilaudid for pain  Imaging Studies ordered:  I ordered imaging studies which included CT abdomen pelvis  I independently visualized and interpreted imaging which showed pancreatitis  Additional history obtained:   Previous records obtained and reviewed    Reevaluation:  After the interventions stated above, I reevaluated the patient and found patient has return of pain  7:28 AM Patient with history of frequent alcohol use presented with abdominal pain.  CT imaging consistent with pancreatitis.  Patient now having return of pain.  Will sign out to Dr. Kellie Simmering at shift change   Final Clinical Impression(s) / ED Diagnoses Final diagnoses:  None    Rx / DC Orders ED Discharge Orders     None        Ripley Fraise, MD 06/10/21 0730
